# Patient Record
Sex: Male | Born: 1950 | Race: Black or African American | Hispanic: No | Marital: Married | Smoking: Never smoker
Health system: Southern US, Community
[De-identification: ages and names within clinical notes are randomized; demographics above are authoritative.]

## PROBLEM LIST (undated history)

## (undated) DIAGNOSIS — Z87442 Personal history of urinary calculi: Secondary | ICD-10-CM

## (undated) DIAGNOSIS — R42 Dizziness and giddiness: Secondary | ICD-10-CM

## (undated) DIAGNOSIS — I10 Essential (primary) hypertension: Secondary | ICD-10-CM

## (undated) DIAGNOSIS — E785 Hyperlipidemia, unspecified: Secondary | ICD-10-CM

## (undated) DIAGNOSIS — C61 Malignant neoplasm of prostate: Secondary | ICD-10-CM

## (undated) DIAGNOSIS — N200 Calculus of kidney: Secondary | ICD-10-CM

## (undated) DIAGNOSIS — K08109 Complete loss of teeth, unspecified cause, unspecified class: Secondary | ICD-10-CM

## (undated) DIAGNOSIS — M1990 Unspecified osteoarthritis, unspecified site: Secondary | ICD-10-CM

## (undated) HISTORY — DX: Essential (primary) hypertension: I10

## (undated) HISTORY — DX: Hyperlipidemia, unspecified: E78.5

## (undated) HISTORY — PX: MULTIPLE TOOTH EXTRACTIONS: SHX2053

## (undated) HISTORY — PX: CARDIAC CATHETERIZATION: SHX172

## (undated) HISTORY — DX: Calculus of kidney: N20.0

---

## 1996-01-30 HISTORY — PX: CERVICAL FUSION: SHX112

## 1999-07-04 ENCOUNTER — Emergency Department (HOSPITAL_COMMUNITY): Admission: EM | Admit: 1999-07-04 | Discharge: 1999-07-04 | Payer: Self-pay | Admitting: Emergency Medicine

## 2005-03-31 ENCOUNTER — Inpatient Hospital Stay (HOSPITAL_COMMUNITY): Admission: EM | Admit: 2005-03-31 | Discharge: 2005-04-01 | Payer: Self-pay | Admitting: Emergency Medicine

## 2005-04-03 ENCOUNTER — Emergency Department (HOSPITAL_COMMUNITY): Admission: EM | Admit: 2005-04-03 | Discharge: 2005-04-03 | Payer: Self-pay | Admitting: Emergency Medicine

## 2006-01-29 HISTORY — PX: COLONOSCOPY: SHX174

## 2006-04-22 ENCOUNTER — Ambulatory Visit: Payer: Self-pay | Admitting: Internal Medicine

## 2006-05-10 ENCOUNTER — Ambulatory Visit: Payer: Self-pay | Admitting: Internal Medicine

## 2009-02-04 ENCOUNTER — Encounter: Payer: Self-pay | Admitting: Cardiovascular Disease

## 2009-02-15 ENCOUNTER — Encounter: Payer: Self-pay | Admitting: Cardiovascular Disease

## 2009-02-24 DIAGNOSIS — E782 Mixed hyperlipidemia: Secondary | ICD-10-CM

## 2009-02-28 ENCOUNTER — Ambulatory Visit: Payer: Self-pay | Admitting: Cardiovascular Disease

## 2009-02-28 DIAGNOSIS — R9431 Abnormal electrocardiogram [ECG] [EKG]: Secondary | ICD-10-CM

## 2009-04-08 ENCOUNTER — Encounter (INDEPENDENT_AMBULATORY_CARE_PROVIDER_SITE_OTHER): Payer: Self-pay | Admitting: *Deleted

## 2010-02-28 NOTE — Letter (Signed)
Summary: Generic Letter  [facility]     [place], [place]    Phone:   Fax:     04/08/2009  BRICEN VICTORY [address] [place], [place]  [address]  Dear Mr. Weng,  During your last office vist.  Dr. Eden Emms ordered a Echocardiogram and Stress Echocardiogram.  We have been unable to reach you.  Please give our office a call at your earliest convenience to schedule the above apptointments.         Sincerely,  Jasmine December Ferguson/Dr. Charlton Haws                               Omar Person

## 2010-02-28 NOTE — Assessment & Plan Note (Signed)
Summary: ec3/abnormal ekg/r/o cad/jss  Medications Added SIMVASTATIN 20 MG TABS (SIMVASTATIN) Take one tablet by mouth daily at bedtime ASPIRIN 81 MG TBEC (ASPIRIN) Take one tablet by mouth daily VITAMIN D 2000 UNIT TABS (CHOLECALCIFEROL) 1 tab by mouth once daily      Allergies Added: NKDA  History of Present Illness: Gregory Marshall is seen today at the request of Dr Perrin Maltese for abnormal ECG.  He has CRF' of positivie family history and elevated lipids.  He is an avid cyclist and does not get SSCP, dyspnea, palpitaotns or syncope.  There is no family history of HOCM or congenital heart disease.  He is a non-smoker and works full time at [employer] including lifting heavy boxes.  He has not had a previous echo or ETT.  I reviewed and labs and ECG from urgent care.  ECG shows early repol, vs LVH vs normal variant in lateral leads and some T wave inversion in 3,F.    Current Problems (verified): 1)  Abnormal Electrocardiogram  (ICD-794.31) 2)  Hyperlipidemia  (ICD-272.4)  Current Medications (verified): 1)  Simvastatin 20 Mg Tabs (Simvastatin) .... Take One Tablet By Mouth Daily At Bedtime 2)  Aspirin 81 Mg Tbec (Aspirin) .... Take One Tablet By Mouth Daily 3)  Vitamin D 2000 Unit Tabs (Cholecalciferol) .Marland Kitchen.. 1 Tab By Mouth Once Daily  Allergies (verified): No Known Drug Allergies  Past History:  Family History: Last updated: 02/24/2009  Brother had bypass grafting in his late 73s. No other  family history of coronary disease.  Social History: Last updated: 02/28/2009  He is married. Works for [employer] Non-smoker One drink/month  Past Medical History: Current Problems:  HYPERLIPIDEMIA (ICD-272.4) Lumbar disc pain Abnormal ECG  Social History:  He is married. Works for [employer] Non-smoker One drink/month  Review of Systems       Denies fever, malais, weight loss, blurry vision, decreased visual acuity, cough, sputum, SOB, hemoptysis, pleuritic pain,  palpitaitons, heartburn, abdominal pain, melena, lower extremity edema, claudication, or rash. All other systems reviewed and negative  Vital Signs:  Patient profile:   60 year old male Height:      66 inches Weight:      192 pounds BMI:     31.10 Pulse rate:   66 / minute BP sitting:   118 / 72  (left arm)  Vitals Entered By: Kem Parkinson (February 28, 2009 4:33 PM)  Physical Exam  General:  .Affect appropriate Healthy:  appears stated age HEENT: normal Neck supple with no adenopathy JVP normal no bruits no thyromegaly Lungs clear with no wheezing and good diaphragmatic motion Heart:  S1/S2 no murmur,rub, gallop or click PMI normal Abdomen: benighn, BS positve, no tenderness, no AAA no bruit.  No HSM or HJR Distal pulses intact with no bruits No edema Neuro non-focal Skin warm and dry    Impression & Recommendations:  Problem # 1:  ABNORMAL ELECTROCARDIOGRAM (ICD-794.31) Check resting and stress echo to R/O LVH, HOCM or CAD His updated medication list for this problem includes:    Aspirin 81 Mg Tbec (Aspirin) .Marland Kitchen... Take one tablet by mouth daily  Orders: Echocardiogram (Echo) Stress Echo (Stress Echo)  Problem # 2:  HYPERLIPIDEMIA (ICD-272.4) F.U Dr Perrin Maltese for surveilance of LFT's. If stress echo normal target LDL less than 130 His updated medication list for this problem includes:    Simvastatin 20 Mg Tabs (Simvastatin) .Marland Kitchen... Take one tablet by mouth daily at bedtime  Patient Instructions: 1)  Your physician recommends  that you schedule a follow-up appointment in: AS NEEDED PENDING TEST RESULTS 2)  Your physician has requested that you have a stress echocardiogram. For further information please visit [email or web address]  Please follow instruction sheet as given. 3)  Your physician has requested that you have an echocardiogram.  Echocardiography is a painless test that uses sound waves to create images of your heart. It provides your doctor with information  about the size and shape of your heart and how well your heart's chambers and valves are working.  This procedure takes approximately one hour. There are no restrictions for this procedure.   EKG Report  Procedure date:  02/05/2009  Findings:      NSR 70 Early repol with biphasic T's in inf and lateral leads

## 2010-06-16 NOTE — Cardiovascular Report (Signed)
NAME:  Gregory Marshall, Gregory Marshall NO.:  [id number]   MEDICAL RECORD NO.:  [id number]          PATIENT TYPE:  INP   LOCATION:  2015                         FACILITY:  [facility]   PHYSICIAN:  Madaline Savage, M.D.DATE OF BIRTH:  [birth year]   DATE OF PROCEDURE:  03/31/2005  DATE OF DISCHARGE:  04/01/2005                              CARDIAC CATHETERIZATION   PROCEDURE PERFORMED:  Emergency cardiac catheterization for CODE STEMI  called by the emergency room physician [facility].   CLINICAL PICTURE:  The patient is a 60 year old gentleman, who has no prior  history of coronary disease, who was riding a bike today and after getting  off the bike and some 5-10 minutes later developed severe nausea and  vomiting and felt very weak. The patient did not have chest pain. Repeat the  patient did not have chest pain. He ultimately came to the [facility]  Emergency Room where the emergency department physician got an  electrocardiogram and repeated it. It showed ST-segment elevation in the  septal leads with T-wave inversions in the anterolateral leads which could  have been interpreted several different ways. One interpretation would be  that it had early repolarization and nonspecific ST changes elsewhere.  Another possibility would be that he was having acute anteroseptal MI. The  patient never had chest pain. Cardiac enzymes first time around were  negative. I brought the patient to the cardiac cath lab based on the call by  the emergency department physician and also because the patient's younger  brother and had coronary artery bypass grafting in his late 72s.   RESULTS:   PRESSURES:  The left ventricular pressure was 130/10, end-diastolic pressure  29. Central aortic pressure 130/75, mean of 100. No aortic valve gradient by  pullback technique.   ANGIOGRAPHIC RESULTS:  The patient's coronary arteries were angiographically  patent. They consisted of a normal left main  coronary artery. The left  anterior descending coronary artery coursed the cardiac apex and gave rise  to one small diagonal branch. There was some myocardial bridging in the mid-  LAD but no obstructive lesions. The circumflex coronary artery was  nondominant but still a fairly large vessel with no disease. There was a  very large obtuse marginal branch arising from the circumflex. There was an  intermediate ramus branch arising from left main that bifurcated shortly  after arising from the left main and both limbs of this intermediate ramus  were normal.   The right coronary artery was dominant and no lesions were seen. LV  angiography showed normal contractility of all wall segments with no  evidence of thrombus or mitral regurgitation. Abdominal aortography was  performed to ensure there was no dissection there. The abdominal aorta was  normal. The renal arteries were normal.   FINAL DIAGNOSIS:  1.  Abnormal electrocardiogram with CODE STEMI called by emergency      department physician.  2.  Angiographic patent coronary arteries.  3.  Normal left ventricular systolic function.  4.  Normal abdominal aorta and renal arteries.   PLAN:  The patient will have continued enzyme  determinations during the  night to ensure that he did not have a myocardial infarction related to  coronary spasm which I do doubt. He will be discharged tomorrow morning  assuming his enzymes are negative and he has an uncomplicated clinical  course this evening and tomorrow morning.           ______________________________  Madaline Savage, M.D.     WHG/MEDQ  D:  03/31/2005  T:  04/02/2005  Job:  [id number]   cc:   [facility]   Medical records

## 2010-06-16 NOTE — Discharge Summary (Signed)
NAME:  ABDOULAYE, DRUM NO.:  [id number]   MEDICAL RECORD NO.:  [id number]          PATIENT TYPE:  INP   LOCATION:  2015                         FACILITY:  [facility]   PHYSICIAN:  Madaline Savage, M.D.DATE OF BIRTH:  12/08/1950   DATE OF ADMISSION:  03/31/2005  DATE OF DISCHARGE:  04/01/2005                                 DISCHARGE SUMMARY   DISCHARGE DIAGNOSES:  1.  Nausea and vomiting, no chest pain.  2.  Abnormal electrocardiogram with appearing to be ST elevation in anterior      leads and flipped T waves in his lateral leads.  3.  Patent coronary arteries, no obvious spasm.  4.  Negative myocardial infarction.  5.  Hyperlipidemia.   DISCHARGE CONDITION:  Improved.   PROCEDURES:  Emergent cardiac catheterization on March 31, 2005 by Dr.  Madaline Savage.   DISCHARGE MEDICATIONS:  1.  Vytorin 10/40 one daily.  2.  The patient had been enrolled in the Horizons Study, including the first      part, receiving Angiomax, but no intervention, so no specific discharge      medicines.   DISCHARGE INSTRUCTIONS:  1.  May return to work on March 31, 2005. No heavy lifting.  2.  Low fat diet.  3.  Wash right groin cath site with soap and water. Call if any bleeding,      swelling or drainage.  4.  Follow up with Dr. Elsie Lincoln in our office. Should call you for the date      and time.  5.  Follow up with [facility], [address], for a complete      physical and to assist with ruling out gallbladder disease.  6.  If you have any further questions on the research, call [facility],      [phone].   HISTORY OF PRESENT ILLNESS:  A 60 year old African American male presented  to the emergency room on March 31, 2005, with nausea and vomiting, no chest  pain. In the ER, they did an EKG and he had ST elevations. A Code STEMI was  called. The patient was taken emergently to the cath lab after being placed  on IV heparin and nitroglycerin. He was also given IV  Lopressor. He was  vomiting and got Zofran. He also received p.o. aspirin. He went to the  cardiac cath lab emergently for further evaluation. He was enrolled in the  Horizons Study at that time.   PAST MEDICAL HISTORY:  No significant history. No hypertension.   OUTPATIENT MEDICATIONS:  None.   FAMILY HISTORY:  Brother had bypass grafting in his late 80s. No other  family history of coronary disease.   SOCIAL HISTORY:  He is married.   REVIEW OF SYSTEMS:  No chest pain, only nausea and vomiting and that was  sudden.   ALLERGIES:  No known allergies.   PHYSICAL EXAMINATION AT DISCHARGE:  VITAL SIGNS: Blood pressure 107/64,  pulse 68, respiratory rate 20, temp 97.4, oxygen saturation on room air 98%.  HEART: Regular rate and rhythm, without murmur, gallop, rub or click.  LUNGS: Clear.  ABDOMEN: Soft, nontender.  EXTREMITIES: Right groin cath site stable. No hematoma.   CARDIAC CATHETERIZATION:  Patent coronary arteries. No MR. EF was 60%. Also,  renal arteries were patent.   LABORATORY DATA:  Admitting labs - hemoglobin 16, hematocrit 45, creatinine  1.3, glucose 151. On the morning of discharge, hemoglobin 14.8, hematocrit  42.8, platelets 300, WBC 10.7, sodium 135, potassium 4.3, BUN 11, creatinine  1.1, glucose 119. Troponin I was 0.03 and 0.02 and 0.02. CKs 306 and 235 and  234. MBs negative at 5.2 and 4.0 and 3.7. LDL 186. HDL 47. Total cholesterol  242. Glycohemoglobin 5.6. BNP less than 30. TSH 0.849. C-reactive protein  0.1. Chest x-ray - no active disease. EKGs - sinus rhythm with sinus  arrhythmia, first degree AV block, PR was 224 milliseconds, possible left  atrial enlargement, ST elevations in V1, V2 and V3, T-wave inversion in V4,  V5 and V6. Followup EKG - similar presentation. EKG on the morning of  discharge - same without any further changes.   HOSPITAL COURSE:  Mr. Rebert was admitted emergently by Dr. Elsie Lincoln secondary  to ST elevations on EKG, after  presenting with nausea and vomiting, but no  chest pain. He was taken emergently to the cath lab after receiving IV  heparin, nitroglycerin, IV Lopressor, Zofran and aspirin. In the cath lab,  he was found to have patent coronary arteries and no obvious spasm was  noted. He received groin closure and was transferred to the telemetry unit  and monitored overnight. He had no further issues during the night, and by  the next morning, cardiac enzymes were negative. His LDL was elevated at  186. He had been placed on Vytorin 10/40.   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 We  will allow him to return to work on April 04, 2005. He will also need to  follow up with [facility] on [address] for further evaluation of  his nausea and vomiting, especially if it reoccurs, to rule out gallbladder  disease.   Dr. Elsie Lincoln saw him and discharged him on April 01, 2005.      Darcella Gasman. Annie Paras, N.P.    ______________________________  Madaline Savage, M.D.   LRI/MEDQ  D:  04/01/2005  T:  04/02/2005  Job:  [id number]   cc:   Guerry Bruin, M.D.  [facility]  [address]  [place], [place] [address]

## 2011-02-14 ENCOUNTER — Ambulatory Visit (INDEPENDENT_AMBULATORY_CARE_PROVIDER_SITE_OTHER): Payer: 59

## 2011-02-14 DIAGNOSIS — J029 Acute pharyngitis, unspecified: Secondary | ICD-10-CM

## 2011-02-14 DIAGNOSIS — J111 Influenza due to unidentified influenza virus with other respiratory manifestations: Secondary | ICD-10-CM

## 2011-04-27 ENCOUNTER — Ambulatory Visit (INDEPENDENT_AMBULATORY_CARE_PROVIDER_SITE_OTHER): Payer: 59 | Admitting: Family Medicine

## 2011-04-27 ENCOUNTER — Ambulatory Visit: Payer: 59

## 2011-04-27 VITALS — BP 127/80 | HR 69 | Temp 97.8°F | Resp 16 | Ht 64.5 in | Wt 174.8 lb

## 2011-04-27 DIAGNOSIS — N529 Male erectile dysfunction, unspecified: Secondary | ICD-10-CM

## 2011-04-27 DIAGNOSIS — I10 Essential (primary) hypertension: Secondary | ICD-10-CM

## 2011-04-27 DIAGNOSIS — E7800 Pure hypercholesterolemia, unspecified: Secondary | ICD-10-CM

## 2011-04-27 MED ORDER — SILDENAFIL CITRATE 100 MG PO TABS: 50.0000 mg | ORAL_TABLET | Freq: Every day | ORAL | Status: DC | PRN

## 2011-04-27 MED ORDER — SIMVASTATIN 40 MG PO TABS: 40.0000 mg | ORAL_TABLET | Freq: Every evening | ORAL | Status: DC

## 2011-04-27 NOTE — Progress Notes (Signed)
  Patient Name: Gregory Marshall Date of Birth: 1950/09/28 Medical Record Number: [id number] Gender: male Date of Encounter: 04/27/2011  History of Present Illness:  Gregory Marshall is a 61 y.o. very pleasant male patient who presents with the following:  Here today for refills of his cholesterol and ED meds.   He is not fasting today and plans to RTC for a CPE soon.  He notes no problems with his medication, no CP or any other symptoms  Patient Active Problem List  Diagnoses  . HYPERLIPIDEMIA  . ABNORMAL ELECTROCARDIOGRAM   No past medical history on file. No past surgical history on file. History  Substance Use Topics  . Smoking status: Never Smoker   . Smokeless tobacco: Not on file  . Alcohol Use: Yes     beer - every now and then   No family history on file. No Known Allergies  Medication list has been reviewed and updated.  Review of Systems: As per HPI- otherwise negative.   Physical Examination: Filed Vitals:   04/27/11 1653  BP: 127/80  Pulse: 69  Temp: 97.8 F (36.6 C)  TempSrc: Oral  Resp: 16  Height: 5' 4.5" (1.638 m)  Weight: 174 lb 12.8 oz (79.289 kg)    Body mass index is 29.54 kg/(m^2).  GEN: WDWN, NAD, Non-toxic, A & O x 3 HEENT: Atraumatic, Normocephalic. Neck supple. No masses, No LAD. Ears and Nose: No external deformity. CV: RRR, No M/G/R. No JVD. No thrill. No extra heart sounds. PULM: CTA B, no wheezes, crackles, rhonchi. No retractions. No resp. distress. No accessory muscle use.Marland Kitchen EXTR: No c/c/e NEURO Normal gait.  PSYCH: Normally interactive. Conversant. Not depressed or anxious appearing.  Calm demeanor.    Assessment and Plan: 1. High cholesterol  simvastatin (ZOCOR) 40 MG tablet  2. ED (erectile dysfunction)  sildenafil (VIAGRA) 100 MG tablet   Refilled meds- encouraged to RTC for recheck if not better as above

## 2011-08-29 ENCOUNTER — Ambulatory Visit (INDEPENDENT_AMBULATORY_CARE_PROVIDER_SITE_OTHER): Payer: 59 | Admitting: Emergency Medicine

## 2011-08-29 VITALS — BP 136/78 | HR 65 | Temp 97.9°F | Resp 17 | Ht 65.0 in | Wt 172.0 lb

## 2011-08-29 DIAGNOSIS — M7660 Achilles tendinitis, unspecified leg: Secondary | ICD-10-CM

## 2011-08-29 DIAGNOSIS — M7661 Achilles tendinitis, right leg: Secondary | ICD-10-CM

## 2011-08-29 MED ORDER — NAPROXEN SODIUM 550 MG PO TABS: 550.0000 mg | ORAL_TABLET | Freq: Two times a day (BID) | ORAL | Status: DC

## 2011-08-29 NOTE — Progress Notes (Signed)
  Subjective:    Patient ID: Gregory Marshall, male    DOB: 11/06/1950, 61 y.o.   MRN: [id number]  HPI  2 year ago history of sprained ankle treated with boot.  Improved with negative interval.  Has pain in right heel started 2 weeks ago causing him to limp.  No history of reinjury  No edema or ecchymosis.  Review of Systems As per HPI, otherwise negative.    Objective:   Physical Exam   GEN: WDWN, NAD, Non-toxic, Alert & Oriented x 3 HEENT: Atraumatic, Normocephalic.  Ears and Nose: No external deformity. EXTR: No clubbing/cyanosis/edema  Tender over insertion of achilles tendon.  No erythema.   Full PROM and AROM.   NEURO: Normal gait.  PSYCH: Normally interactive. Conversant. Not depressed or anxious appearing.  Calm demeanor.        Assessment & Plan:  Achilles tendonitis Boot Anaprox Follow up in two weeks

## 2012-06-09 ENCOUNTER — Ambulatory Visit (INDEPENDENT_AMBULATORY_CARE_PROVIDER_SITE_OTHER): Payer: 59 | Admitting: Emergency Medicine

## 2012-06-09 ENCOUNTER — Ambulatory Visit: Payer: 59

## 2012-06-09 VITALS — BP 128/72 | HR 68 | Temp 97.8°F | Resp 16 | Ht 66.0 in | Wt 179.0 lb

## 2012-06-09 DIAGNOSIS — M549 Dorsalgia, unspecified: Secondary | ICD-10-CM

## 2012-06-09 DIAGNOSIS — M5137 Other intervertebral disc degeneration, lumbosacral region: Secondary | ICD-10-CM

## 2012-06-09 DIAGNOSIS — R200 Anesthesia of skin: Secondary | ICD-10-CM

## 2012-06-09 DIAGNOSIS — R209 Unspecified disturbances of skin sensation: Secondary | ICD-10-CM

## 2012-06-09 DIAGNOSIS — M5136 Other intervertebral disc degeneration, lumbar region: Secondary | ICD-10-CM

## 2012-06-09 MED ORDER — PREDNISONE 10 MG PO TABS: ORAL_TABLET | ORAL | Status: DC

## 2012-06-09 MED ORDER — HYDROCODONE-ACETAMINOPHEN 5-325 MG PO TABS: 1.0000 | ORAL_TABLET | Freq: Four times a day (QID) | ORAL | Status: DC | PRN

## 2012-06-09 NOTE — Progress Notes (Signed)
  Subjective:    Patient ID: Gregory Marshall, male    DOB: 1950/04/22, 62 y.o.   MRN: [id number]  HPI patient here with onset Friday evening of severe pain in his lower back. It started with pain in the we'll lumbar spine area L5-S1. He now has a burning sensation over the lateral left hip. This is not associated with any weakness or numbness in his left foot or ankle. He does have some difficulty walking because of pain in his back. Patient works at [employer] ex. He does heavy lifting all the time.    Review of Systems     Objective:   Physical Exam is tenderness at L5-S1 on the left. Straight leg raising is positive on the left at 90. Deep tendon reflexes are 2+ and symmetrical. Motor strength is 5 out of 5 all muscle groups.  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 There is degenerative disc disease at L4-5 and L5-S1. There is a large calcified confluent area over the pubic symphysis.        Assessment & Plan:  Shot with lumbar spine films. He does have signs and symptoms consistent with an LS strain with radiculopathy . Because of this heavy lifting this raises the issue of a disc disease. The patient has had previous surgery on his neck by Dr. Ezzard Standing. We'll place on a tapered dose of prednisone, hydrocodone for pain, a work note for one week. recheck in about a week.

## 2012-06-10 ENCOUNTER — Telehealth: Payer: Self-pay

## 2012-06-10 NOTE — Telephone Encounter (Signed)
PT WAS SEEN BY DR Cleta Alberts AND WANTED TO SPEAK WITH SOMEONE ABOUT A BACK BRACE PLEASE CALL [phone] OR [phone]

## 2012-06-11 ENCOUNTER — Ambulatory Visit (INDEPENDENT_AMBULATORY_CARE_PROVIDER_SITE_OTHER): Payer: 59 | Admitting: Emergency Medicine

## 2012-06-11 VITALS — BP 150/78 | HR 68 | Temp 97.7°F | Resp 16 | Ht 66.0 in | Wt 182.0 lb

## 2012-06-11 DIAGNOSIS — M5432 Sciatica, left side: Secondary | ICD-10-CM

## 2012-06-11 DIAGNOSIS — M5430 Sciatica, unspecified side: Secondary | ICD-10-CM | POA: Insufficient documentation

## 2012-06-11 MED ORDER — OXYCODONE-ACETAMINOPHEN 5-325 MG PO TABS: 1.0000 | ORAL_TABLET | ORAL | Status: DC | PRN

## 2012-06-11 NOTE — Progress Notes (Signed)
Reviewed and agree.

## 2012-06-11 NOTE — Progress Notes (Signed)
[facility] [address], [place] [address] [id number]  Date:  06/11/2012   Name:  Gregory Marshall   DOB:  [birth year]   MRN:  [id number]  PCP:  No PCP Per Patient    Chief Complaint: Follow-up   History of Present Illness:  Gregory Marshall is a 62 y.o. very pleasant male patient who presents with the following:  Seen last week for pain in back and left sciatic neuritis.  Placed on vicodin and prednisone and is awaiting a neurosurgery referral.  Says no improvement in pain or gait.  Still has pain in leg to below knee.  Continues to have numbness in left leg.  No weakness.  No history of recent injury or overuse.  Denies other complaint or health concern today.   Patient Active Problem List   Diagnosis Date Noted  . Sciatica 06/11/2012  . ABNORMAL ELECTROCARDIOGRAM 02/28/2009  . HYPERLIPIDEMIA 02/24/2009    Past Medical History  Diagnosis Date  . Hyperlipidemia     No past surgical history on file.  History  Substance Use Topics  . Smoking status: Never Smoker   . Smokeless tobacco: Not on file  . Alcohol Use: Yes     Comment: beer - every now and then    No family history on file.  No Known Allergies  Medication list has been reviewed and updated.  Current Outpatient Prescriptions on File Prior to Visit  Medication Sig Dispense Refill  . HYDROcodone-acetaminophen (NORCO) 5-325 MG per tablet Take 1 tablet by mouth every 6 (six) hours as needed for pain.  30 tablet  0  . naproxen sodium (ANAPROX DS) 550 MG tablet Take 1 tablet (550 mg total) by mouth 2 (two) times daily with a meal.  50 tablet  0  . predniSONE (DELTASONE) 10 MG tablet Take 4 day for 3 days 3 a day for 3 days 2 a day for 3 days one a day for 3 days  30 tablet  0  . sildenafil (VIAGRA) 100 MG tablet Take 0.5 tablets (50 mg total) by mouth daily as needed.  5 tablet  10  . simvastatin (ZOCOR) 40 MG tablet Take 1 tablet (40 mg total) by mouth every evening.  90 tablet  3   No  current facility-administered medications on file prior to visit.    Review of Systems:  As per HPI, otherwise negative.    Physical Examination: Filed Vitals:   06/11/12 0818  BP: 150/78  Pulse: 68  Temp: 97.7 F (36.5 C)  Resp: 16   Filed Vitals:   06/11/12 0818  Height: 5\' 6"  (1.676 m)  Weight: 182 lb (82.555 kg)   Body mass index is 29.39 kg/(m^2). Ideal Body Weight: Weight in (lb) to have BMI = 25: 154.6   GEN: WDWN, NAD, Non-toxic, Alert & Oriented x 3 HEENT: Atraumatic, Normocephalic.  Ears and Nose: No external deformity. EXTR: No clubbing/cyanosis/edema NEURO: Antalgic gait. SLR impaired PSYCH: Normally interactive. Conversant. Not depressed or anxious appearing.  Calm demeanor.  BACK:  Not tender.   Assessment and Plan: Percocet MRI Follow up as needed Off work until cleared   Signed,  Phillips Odor, MD

## 2012-06-11 NOTE — Telephone Encounter (Signed)
Called him, back brace could weaken his abdominal muscles, may not be a good idea. If he needs it for work, can do Rx but he should limit use of this, if he gets one.

## 2012-06-11 NOTE — Patient Instructions (Addendum)
Sciatica Sciatica is pain, weakness, numbness, or tingling along the path of the sciatic nerve. The nerve starts in the lower back and runs down the back of each leg. The nerve controls the muscles in the lower leg and in the back of the knee, while also providing sensation to the back of the thigh, lower leg, and the sole of your foot. Sciatica is a symptom of another medical condition. For instance, nerve damage or certain conditions, such as a herniated disk or bone spur on the spine, pinch or put pressure on the sciatic nerve. This causes the pain, weakness, or other sensations normally associated with sciatica. Generally, sciatica only affects one side of the body. CAUSES   Herniated or slipped disc.  Degenerative disk disease.  A pain disorder involving the narrow muscle in the buttocks (piriformis syndrome).  Pelvic injury or fracture.  Pregnancy.  Tumor (rare). SYMPTOMS  Symptoms can vary from mild to very severe. The symptoms usually travel from the low back to the buttocks and down the back of the leg. Symptoms can include:  Mild tingling or dull aches in the lower back, leg, or hip.  Numbness in the back of the calf or sole of the foot.  Burning sensations in the lower back, leg, or hip.  Sharp pains in the lower back, leg, or hip.  Leg weakness.  Severe back pain inhibiting movement. These symptoms may get worse with coughing, sneezing, laughing, or prolonged sitting or standing. Also, being overweight may worsen symptoms. DIAGNOSIS  Your caregiver will perform a physical exam to look for common symptoms of sciatica. He or she may ask you to do certain movements or activities that would trigger sciatic nerve pain. Other tests may be performed to find the cause of the sciatica. These may include:  Blood tests.  X-rays.  Imaging tests, such as an MRI or CT scan. TREATMENT  Treatment is directed at the cause of the sciatic pain. Sometimes, treatment is not necessary  and the pain and discomfort goes away on its own. If treatment is needed, your caregiver may suggest:  Over-the-counter medicines to relieve pain.  Prescription medicines, such as anti-inflammatory medicine, muscle relaxants, or narcotics.  Applying heat or ice to the painful area.  Steroid injections to lessen pain, irritation, and inflammation around the nerve.  Reducing activity during periods of pain.  Exercising and stretching to strengthen your abdomen and improve flexibility of your spine. Your caregiver may suggest losing weight if the extra weight makes the back pain worse.  Physical therapy.  Surgery to eliminate what is pressing or pinching the nerve, such as a bone spur or part of a herniated disk. HOME CARE INSTRUCTIONS   Only take over-the-counter or prescription medicines for pain or discomfort as directed by your caregiver.  Apply ice to the affected area for 20 minutes, 3 4 times a day for the first 48 72 hours. Then try heat in the same way.  Exercise, stretch, or perform your usual activities if these do not aggravate your pain.  Attend physical therapy sessions as directed by your caregiver.  Keep all follow-up appointments as directed by your caregiver.  Do not wear high heels or shoes that do not provide proper support.  Check your mattress to see if it is too soft. A firm mattress may lessen your pain and discomfort. SEEK IMMEDIATE MEDICAL CARE IF:   You lose control of your bowel or bladder (incontinence).  You have increasing weakness in the lower back,   pelvis, buttocks, or legs.  You have redness or swelling of your back.  You have a burning sensation when you urinate.  You have pain that gets worse when you lie down or awakens you at night.  Your pain is worse than you have experienced in the past.  Your pain is lasting longer than 4 weeks.  You are suddenly losing weight without reason. MAKE SURE YOU:  Understand these  instructions.  Will watch your condition.  Will get help right away if you are not doing well or get worse. Document Released: 01/09/2001 Document Revised: 07/17/2011 Document Reviewed: 05/27/2011 [employer] Patient Information 2013 [employer].  

## 2012-06-12 ENCOUNTER — Telehealth: Payer: Self-pay

## 2012-06-12 ENCOUNTER — Ambulatory Visit (INDEPENDENT_AMBULATORY_CARE_PROVIDER_SITE_OTHER): Payer: 59 | Admitting: Family Medicine

## 2012-06-12 VITALS — BP 164/84 | HR 68 | Temp 98.3°F | Resp 18 | Ht 64.5 in | Wt 179.2 lb

## 2012-06-12 DIAGNOSIS — M5432 Sciatica, left side: Secondary | ICD-10-CM

## 2012-06-12 DIAGNOSIS — M545 Low back pain, unspecified: Secondary | ICD-10-CM

## 2012-06-12 DIAGNOSIS — M5136 Other intervertebral disc degeneration, lumbar region: Secondary | ICD-10-CM

## 2012-06-12 DIAGNOSIS — M5137 Other intervertebral disc degeneration, lumbosacral region: Secondary | ICD-10-CM

## 2012-06-12 DIAGNOSIS — M5430 Sciatica, unspecified side: Secondary | ICD-10-CM

## 2012-06-12 DIAGNOSIS — M51369 Other intervertebral disc degeneration, lumbar region without mention of lumbar back pain or lower extremity pain: Secondary | ICD-10-CM

## 2012-06-12 MED ORDER — DIAZEPAM 5 MG PO TABS: 5.0000 mg | ORAL_TABLET | Freq: Two times a day (BID) | ORAL | Status: DC | PRN

## 2012-06-12 MED ORDER — GABAPENTIN 300 MG PO CAPS: 300.0000 mg | ORAL_CAPSULE | Freq: Two times a day (BID) | ORAL | Status: DC

## 2012-06-12 MED ORDER — KETOROLAC TROMETHAMINE 60 MG/2ML IM SOLN
60.0000 mg | Freq: Once | INTRAMUSCULAR | Status: AC
  Administered 2012-06-12: 60 mg via INTRAMUSCULAR

## 2012-06-12 MED ORDER — HYDROMORPHONE HCL 2 MG PO TABS: 2.0000 mg | ORAL_TABLET | ORAL | Status: DC | PRN

## 2012-06-12 MED ORDER — LIDOCAINE 5 % EX PTCH: 1.0000 | MEDICATED_PATCH | CUTANEOUS | Status: DC

## 2012-06-12 NOTE — Telephone Encounter (Signed)
A.  To get another pain medicine, he MUST come in B.  Not much stronger than percocet

## 2012-06-12 NOTE — Telephone Encounter (Signed)
He has percocet, waiting for MRI / then neurosurgeon,please advise. States no relief.

## 2012-06-12 NOTE — Telephone Encounter (Signed)
Left message for him to call me back.  

## 2012-06-12 NOTE — Progress Notes (Signed)
Subjective:    Patient ID: Gregory Marshall, male    DOB: [birth year], 62 y.o.   MRN: [id number] Chief Complaint  Patient presents with  . Follow-up    to recheck lower back. Severe pain, need to change meds. for pain   HPI   Gregory Marshall is a 62 yo male who has been twice previously in the past 3d Saw Dr. Newell Coral previously for his neck surgery a long time ago - still waiting on appointment.  Was not in this bad of pain before, couldn't even sleep last night, can barely walk.  They just called him to scheduled MRI but he was in our office so prob not till next week.  Doesn't think steroids helped at all.  The percocet dulls his pain for a little but but as soon as he starts moving or tries to get some sleep, he is in just as much pain.    Pain shoooting down left leg, no weakness or numbness, some tingling.  No changes in bowels/bladder.  No f/c.  Pain was so severe it was cuasing him sweats.  Past Medical History  Diagnosis Date  . Hyperlipidemia   . Vertigo     hx of, x 1  . Arthritis    Current Outpatient Prescriptions on File Prior to Visit  Medication Sig Dispense Refill  . oxyCODONE-acetaminophen (ROXICET) 5-325 MG per tablet Take 1 tablet by mouth every 4 (four) hours as needed for pain.  40 tablet  0  . simvastatin (ZOCOR) 40 MG tablet Take 1 tablet (40 mg total) by mouth every evening.  90 tablet  3  . naproxen sodium (ANAPROX DS) 550 MG tablet Take 1 tablet (550 mg total) by mouth 2 (two) times daily with a meal.  50 tablet  0   No current facility-administered medications on file prior to visit.   No Known Allergies   Review of Systems  Constitutional: Positive for diaphoresis. Negative for fever and chills.  Gastrointestinal: Negative for abdominal pain, diarrhea and constipation.  Genitourinary: Negative for urgency, frequency, decreased urine volume and difficulty urinating.  Musculoskeletal: Positive for myalgias, back pain, arthralgias and gait problem. Negative for  joint swelling.  Skin: Negative for color change and wound.  Neurological: Negative for dizziness, weakness, light-headedness and numbness.      BP 164/84  Pulse 68  Temp(Src) 98.3 F (36.8 C) (Oral)  Resp 18  Ht 5' 4.5" (1.638 m)  Wt 179 lb 3.2 oz (81.285 kg)  BMI 30.3 kg/m2  SpO2 99% Objective:   Physical Exam  Constitutional: He is oriented to person, place, and time. He appears well-developed and well-nourished. He appears distressed.  Visibly uncomfortable, unable to sit in chair, rearranging frequently.  HENT:  Head: Normocephalic and atraumatic.  Eyes: No scleral icterus.  Pulmonary/Chest: Effort normal.  Neurological: He is alert and oriented to person, place, and time.  Skin: Skin is warm and dry. He is not diaphoretic.  Psychiatric: He has a normal mood and affect. His behavior is normal.      Assessment & Plan:  Sciatica, left - Plan: ketorolac (TORADOL) injection 60 mg IM x 1 in office now as pt clearly VERY uncomfortable.  DDD (degenerative disc disease), lumbar - f/u w/ Dr. Newell Coral, neurosurg ASAP - call today for an appt.  Low back pain radiating to left leg - Pt having severe pain that is not responding to prev pain meds rx'ed at 2 visits earlier this wk - rec doubling up on percocet to  2 tabs instead of 1 ever 4 to 6 hours. If pain is uncontrolled on 2 tabs percocet 5, he can alternatively fill dilaudid but do NOT use simultaneously.  Start trial of gabapentin and lidoderm patch as well - warned of cessation.  Can try valium at night to help w/ spasming and sleep.  Meds ordered this encounter  Medications  . HYDROmorphone (DILAUDID) 2 MG tablet    Sig: Take 1 tablet (2 mg total) by mouth every 4 (four) hours as needed for pain.    Dispense:  30 tablet    Refill:  0  . gabapentin (NEURONTIN) 300 MG capsule    Sig: Take 1 capsule (300 mg total) by mouth 2 (two) times daily.    Dispense:  60 capsule    Refill:  0  . lidocaine (LIDODERM) 5 %    Sig: Place 1  patch onto the skin daily. Remove & Discard patch within 12 hours.    Dispense:  30 patch    Refill:  0  . diazepam (VALIUM) 5 MG tablet    Sig: Take 1 tablet (5 mg total) by mouth every 12 (twelve) hours as needed for sleep (and severe muscle spasm).    Dispense:  20 tablet    Refill:  0  . ketorolac (TORADOL) injection 60 mg    Sig:

## 2012-06-12 NOTE — Patient Instructions (Signed)
Increase your percocet to 2 tabs four times a day.  Get your MRI scheduled as soon as you can.  Start the gabapentin twice a day - this may make you a little fatigued at first but should improve over time. This helps the shooting, burning nerve pain.  Apply the topical lidocaine patch every morning to the highest area on your back with pain and it should numb that area and hopefully the nerve traveling from there as well.  Take the valium at night - this should help with sleep and muscle  relaxant. Do not combine the valium and to much of the pain medication as it could slow - OR EVEN STOP - your breathing - we do NOT want you to ACCIDENTALLY OVERDOSE!!!  If the 2 tabs of percocet at a time, still aren't working for you, then you can try the dilaudid prescription instead.

## 2012-06-12 NOTE — Telephone Encounter (Signed)
Left message for him to call back if he needs for his job. Amy

## 2012-06-12 NOTE — Telephone Encounter (Signed)
Patient states pain meds for slipped disk are not working. Would like something stronger until he can get to neurosurgeon. [phone]

## 2012-06-15 ENCOUNTER — Ambulatory Visit
Admission: RE | Admit: 2012-06-15 | Discharge: 2012-06-15 | Disposition: A | Discharge status: Home / Self Care | Payer: 59 | Source: Ambulatory Visit | Attending: Emergency Medicine | Admitting: Emergency Medicine

## 2012-06-15 DIAGNOSIS — M5432 Sciatica, left side: Secondary | ICD-10-CM

## 2012-06-16 ENCOUNTER — Telehealth: Payer: Self-pay

## 2012-06-16 NOTE — Telephone Encounter (Signed)
THIS MESSAGE IS FOR DR. SHAW  Gregory Marshall WANTS TO THANK YOU FOR EVERYTHING YOU HAVE DONE FOR HIM. BEST PHONE [phone] (CELL)  MBC

## 2012-06-17 NOTE — Telephone Encounter (Signed)
Called Lupita Leash we are waiting to hear back from this appt, we sent information on him on 06/10/12. I will call today and check on this to see if I can get appt for him.

## 2012-06-17 NOTE — Telephone Encounter (Signed)
Thanks. - I'm really glad he finally got some relief with the medications we tried.  Looks like he will definitely need to see neuro surg for the nerve that is being pinched by a bulging/herniated disc on the left side of the middle of his low back. Has he heard back from them yet and gotten an appt?

## 2012-06-18 NOTE — Telephone Encounter (Signed)
Called him they did get him and he has appt, they will see him today

## 2012-06-19 ENCOUNTER — Other Ambulatory Visit: Payer: Self-pay | Admitting: Neurosurgery

## 2012-06-19 NOTE — Pre-Procedure Instructions (Signed)
Gregory Marshall  06/19/2012   Your procedure is scheduled on: Saturday, May 24th.  Report to [facility] at 6:00AM.  Call this number if you have problems the morning of surgery: [phone]   Remember:   Do not eat food or drink liquids after midnight.    Take these medicines the morning of surgery with A SIP OF WATER: gabapentin (NEURONTIN).                    May take pain medication and diazepam (VALIUM) if needed.                 Stop taking: naproxen sodium (ANAPROX DS)                      Do not wear jewelry, make-up or nail polish.  Do not wear lotions, powders, or perfumes. You may wear deodorant.  Men may shave face and neck.  Do not bring valuables to the hospital.  Contacts, dentures or bridgework may not be worn into surgery.  Leave suitcase in the car. After surgery it may be brought to your room.  For patients admitted to the hospital, checkout time is 11:00 AM the day of discharge.   Patients discharged the day of surgery will not be allowed to drive home.  Name and phone number of your driver:-    Special Instructions:Shower with CHG wash (Bactoshield) tonight and again in the am prior to arriving to hospital.   Please read over the following fact sheets that you were given: Pain Booklet, Coughing and Deep Breathing and Surgical Site Infection Prevention

## 2012-06-20 ENCOUNTER — Encounter (HOSPITAL_COMMUNITY): Payer: Self-pay

## 2012-06-20 ENCOUNTER — Encounter (HOSPITAL_COMMUNITY)
Admission: RE | Admit: 2012-06-20 | Discharge: 2012-06-20 | Disposition: A | Discharge status: Home / Self Care | Payer: 59 | Source: Ambulatory Visit | Attending: Neurosurgery | Admitting: Neurosurgery

## 2012-06-20 ENCOUNTER — Encounter (HOSPITAL_COMMUNITY): Payer: Self-pay | Admitting: Pharmacy Technician

## 2012-06-20 HISTORY — DX: Dizziness and giddiness: R42

## 2012-06-20 HISTORY — DX: Unspecified osteoarthritis, unspecified site: M19.90

## 2012-06-20 LAB — CBC
MCHC: 35.4 g/dL (ref 30.0–36.0)
MCV: 84 fL (ref 78.0–100.0)
Platelets: 264 10*3/uL (ref 150–400)
WBC: 7.3 10*3/uL (ref 4.0–10.5)

## 2012-06-20 LAB — SURGICAL PCR SCREEN
MRSA, PCR: NEGATIVE
Staphylococcus aureus: NEGATIVE

## 2012-06-20 MED ORDER — CEFAZOLIN SODIUM-DEXTROSE 2-3 GM-% IV SOLR
2.0000 g | INTRAVENOUS | Status: AC
  Administered 2012-06-21: 2 g via INTRAVENOUS

## 2012-06-21 ENCOUNTER — Encounter (HOSPITAL_COMMUNITY)
Admission: RE | Disposition: A | Discharge status: Home / Self Care | Payer: Self-pay | Source: Ambulatory Visit | Attending: Neurosurgery

## 2012-06-21 ENCOUNTER — Ambulatory Visit (HOSPITAL_COMMUNITY): Payer: 59

## 2012-06-21 ENCOUNTER — Encounter (HOSPITAL_COMMUNITY): Payer: Self-pay | Admitting: Anesthesiology

## 2012-06-21 ENCOUNTER — Encounter (HOSPITAL_COMMUNITY): Payer: Self-pay | Admitting: *Deleted

## 2012-06-21 ENCOUNTER — Ambulatory Visit (HOSPITAL_COMMUNITY): Payer: 59 | Admitting: Anesthesiology

## 2012-06-21 ENCOUNTER — Observation Stay (HOSPITAL_COMMUNITY)
Admission: RE | Admit: 2012-06-21 | Discharge: 2012-06-22 | Disposition: A | Discharge status: Home / Self Care | Payer: 59 | Source: Ambulatory Visit | Attending: Neurosurgery | Admitting: Neurosurgery

## 2012-06-21 DIAGNOSIS — M47817 Spondylosis without myelopathy or radiculopathy, lumbosacral region: Secondary | ICD-10-CM | POA: Insufficient documentation

## 2012-06-21 DIAGNOSIS — M5126 Other intervertebral disc displacement, lumbar region: Principal | ICD-10-CM | POA: Insufficient documentation

## 2012-06-21 DIAGNOSIS — M51379 Other intervertebral disc degeneration, lumbosacral region without mention of lumbar back pain or lower extremity pain: Secondary | ICD-10-CM | POA: Insufficient documentation

## 2012-06-21 DIAGNOSIS — M5137 Other intervertebral disc degeneration, lumbosacral region: Secondary | ICD-10-CM | POA: Insufficient documentation

## 2012-06-21 HISTORY — PX: LUMBAR LAMINECTOMY/DECOMPRESSION MICRODISCECTOMY: SHX5026

## 2012-06-21 SURGERY — LUMBAR LAMINECTOMY/DECOMPRESSION MICRODISCECTOMY 1 LEVEL
Anesthesia: General | Site: Back | Laterality: Left | Wound class: Clean

## 2012-06-21 MED ORDER — ARTIFICIAL TEARS OP OINT: TOPICAL_OINTMENT | OPHTHALMIC | Status: DC | PRN

## 2012-06-21 MED ORDER — ZOLPIDEM TARTRATE 5 MG PO TABS: 10.0000 mg | ORAL_TABLET | Freq: Every evening | ORAL | Status: DC | PRN

## 2012-06-21 MED ORDER — FENTANYL CITRATE 0.05 MG/ML IJ SOLN
INTRAMUSCULAR | Status: DC | PRN
  Administered 2012-06-21 (×5): 50 ug via INTRAVENOUS

## 2012-06-21 MED ORDER — MAGNESIUM HYDROXIDE 400 MG/5ML PO SUSP: 30.0000 mL | Freq: Every day | ORAL | Status: DC | PRN

## 2012-06-21 MED ORDER — SODIUM CHLORIDE 0.9 % IV SOLN
INTRAVENOUS | Status: AC
  Filled 2012-06-21: qty 500

## 2012-06-21 MED ORDER — PHENOL 1.4 % MT LIQD: 1.0000 | OROMUCOSAL | Status: DC | PRN

## 2012-06-21 MED ORDER — HYDROMORPHONE HCL PF 1 MG/ML IJ SOLN
0.2500 mg | INTRAMUSCULAR | Status: DC | PRN
  Administered 2012-06-21: 0.5 mg via INTRAVENOUS

## 2012-06-21 MED ORDER — ROCURONIUM BROMIDE 100 MG/10ML IV SOLN
INTRAVENOUS | Status: DC | PRN
  Administered 2012-06-21: 50 mg via INTRAVENOUS

## 2012-06-21 MED ORDER — ONDANSETRON HCL 4 MG/2ML IJ SOLN: 4.0000 mg | Freq: Four times a day (QID) | INTRAMUSCULAR | Status: DC | PRN

## 2012-06-21 MED ORDER — FENTANYL CITRATE 0.05 MG/ML IJ SOLN
INTRAMUSCULAR | Status: AC
  Filled 2012-06-21: qty 2

## 2012-06-21 MED ORDER — LACTATED RINGERS IV SOLN
INTRAVENOUS | Status: DC | PRN
  Administered 2012-06-21 (×2): via INTRAVENOUS

## 2012-06-21 MED ORDER — OXYCODONE HCL 5 MG PO TABS
5.0000 mg | ORAL_TABLET | ORAL | Status: DC | PRN
  Administered 2012-06-21 – 2012-06-22 (×3): 10 mg via ORAL
  Filled 2012-06-21 (×3): qty 2

## 2012-06-21 MED ORDER — OXYCODONE HCL 5 MG/5ML PO SOLN: 5.0000 mg | Freq: Once | ORAL | Status: DC | PRN

## 2012-06-21 MED ORDER — CEFAZOLIN SODIUM-DEXTROSE 2-3 GM-% IV SOLR
INTRAVENOUS | Status: AC
  Filled 2012-06-21: qty 50

## 2012-06-21 MED ORDER — GLYCOPYRROLATE 0.2 MG/ML IJ SOLN
INTRAMUSCULAR | Status: DC | PRN
  Administered 2012-06-21: 0.4 mg via INTRAVENOUS

## 2012-06-21 MED ORDER — METHYLPREDNISOLONE ACETATE 80 MG/ML IJ SUSP
INTRAMUSCULAR | Status: DC | PRN
  Administered 2012-06-21: 80 mg

## 2012-06-21 MED ORDER — ACETAMINOPHEN 325 MG PO TABS: 650.0000 mg | ORAL_TABLET | ORAL | Status: DC | PRN

## 2012-06-21 MED ORDER — MIDAZOLAM HCL 5 MG/5ML IJ SOLN
INTRAMUSCULAR | Status: DC | PRN
  Administered 2012-06-21 (×2): 1 mg via INTRAVENOUS

## 2012-06-21 MED ORDER — KCL IN DEXTROSE-NACL 20-5-0.45 MEQ/L-%-% IV SOLN
INTRAVENOUS | Status: DC
  Administered 2012-06-21: 15:00:00 via INTRAVENOUS
  Filled 2012-06-21 (×4): qty 1000

## 2012-06-21 MED ORDER — BISACODYL 10 MG RE SUPP: 10.0000 mg | Freq: Every day | RECTAL | Status: DC | PRN

## 2012-06-21 MED ORDER — SODIUM CHLORIDE 0.9 % IJ SOLN: 3.0000 mL | INTRAMUSCULAR | Status: DC | PRN

## 2012-06-21 MED ORDER — SODIUM CHLORIDE 0.9 % IV SOLN: 250.0000 mL | INTRAVENOUS | Status: DC

## 2012-06-21 MED ORDER — SODIUM CHLORIDE 0.9 % IJ SOLN
3.0000 mL | Freq: Two times a day (BID) | INTRAMUSCULAR | Status: DC
  Administered 2012-06-21: 3 mL via INTRAVENOUS

## 2012-06-21 MED ORDER — BUPIVACAINE HCL (PF) 0.5 % IJ SOLN
INTRAMUSCULAR | Status: DC | PRN
  Administered 2012-06-21: 10 mL

## 2012-06-21 MED ORDER — LIDOCAINE-EPINEPHRINE 1 %-1:100000 IJ SOLN
INTRAMUSCULAR | Status: DC | PRN
  Administered 2012-06-21: 10 mL via INTRADERMAL

## 2012-06-21 MED ORDER — 0.9 % SODIUM CHLORIDE (POUR BTL) OPTIME
TOPICAL | Status: DC | PRN
  Administered 2012-06-21: 1000 mL

## 2012-06-21 MED ORDER — ONDANSETRON HCL 4 MG/2ML IJ SOLN
INTRAMUSCULAR | Status: DC | PRN
  Administered 2012-06-21: 4 mg via INTRAVENOUS

## 2012-06-21 MED ORDER — BACITRACIN 50000 UNITS IM SOLR
INTRAMUSCULAR | Status: AC
  Filled 2012-06-21: qty 1

## 2012-06-21 MED ORDER — KETOROLAC TROMETHAMINE 30 MG/ML IJ SOLN
30.0000 mg | Freq: Four times a day (QID) | INTRAMUSCULAR | Status: DC
  Administered 2012-06-21 – 2012-06-22 (×4): 30 mg via INTRAVENOUS
  Filled 2012-06-21 (×7): qty 1

## 2012-06-21 MED ORDER — FENTANYL CITRATE 0.05 MG/ML IJ SOLN
INTRAMUSCULAR | Status: DC | PRN
  Administered 2012-06-21: 100 ug via INTRAVENOUS

## 2012-06-21 MED ORDER — THROMBIN 5000 UNITS EX SOLR
CUTANEOUS | Status: DC | PRN
  Administered 2012-06-21 (×2): 5000 [IU] via TOPICAL

## 2012-06-21 MED ORDER — MORPHINE SULFATE 4 MG/ML IJ SOLN: 4.0000 mg | INTRAMUSCULAR | Status: DC | PRN

## 2012-06-21 MED ORDER — MENTHOL 3 MG MT LOZG: 1.0000 | LOZENGE | OROMUCOSAL | Status: DC | PRN

## 2012-06-21 MED ORDER — SODIUM CHLORIDE 0.9 % IR SOLN
Status: DC | PRN
  Administered 2012-06-21: 08:00:00

## 2012-06-21 MED ORDER — SIMVASTATIN 40 MG PO TABS
40.0000 mg | ORAL_TABLET | Freq: Every evening | ORAL | Status: DC
  Administered 2012-06-21: 40 mg via ORAL
  Filled 2012-06-21 (×3): qty 1

## 2012-06-21 MED ORDER — NEOSTIGMINE METHYLSULFATE 1 MG/ML IJ SOLN
INTRAMUSCULAR | Status: DC | PRN
  Administered 2012-06-21: 3 mg via INTRAVENOUS

## 2012-06-21 MED ORDER — ALUM & MAG HYDROXIDE-SIMETH 200-200-20 MG/5ML PO SUSP: 30.0000 mL | Freq: Four times a day (QID) | ORAL | Status: DC | PRN

## 2012-06-21 MED ORDER — HYDROMORPHONE HCL PF 1 MG/ML IJ SOLN
INTRAMUSCULAR | Status: AC
  Filled 2012-06-21: qty 1

## 2012-06-21 MED ORDER — ACETAMINOPHEN 10 MG/ML IV SOLN
INTRAVENOUS | Status: AC
  Administered 2012-06-21: 1000 mg via INTRAVENOUS
  Filled 2012-06-21: qty 100

## 2012-06-21 MED ORDER — PROPOFOL 10 MG/ML IV BOLUS
INTRAVENOUS | Status: DC | PRN
  Administered 2012-06-21: 150 mg via INTRAVENOUS

## 2012-06-21 MED ORDER — HEMOSTATIC AGENTS (NO CHARGE) OPTIME
TOPICAL | Status: DC | PRN
  Administered 2012-06-21: 1 via TOPICAL

## 2012-06-21 MED ORDER — HYDROXYZINE HCL 25 MG PO TABS: 50.0000 mg | ORAL_TABLET | ORAL | Status: DC | PRN

## 2012-06-21 MED ORDER — CYCLOBENZAPRINE HCL 10 MG PO TABS: 10.0000 mg | ORAL_TABLET | Freq: Three times a day (TID) | ORAL | Status: DC | PRN

## 2012-06-21 MED ORDER — ACETAMINOPHEN 10 MG/ML IV SOLN
1000.0000 mg | Freq: Four times a day (QID) | INTRAVENOUS | Status: DC
  Administered 2012-06-21 – 2012-06-22 (×2): 1000 mg via INTRAVENOUS
  Filled 2012-06-21 (×5): qty 100

## 2012-06-21 MED ORDER — KETOROLAC TROMETHAMINE 30 MG/ML IJ SOLN
INTRAMUSCULAR | Status: AC
  Filled 2012-06-21: qty 1

## 2012-06-21 MED ORDER — KETOROLAC TROMETHAMINE 30 MG/ML IJ SOLN: 30.0000 mg | Freq: Once | INTRAMUSCULAR | Status: DC

## 2012-06-21 MED ORDER — ACETAMINOPHEN 650 MG RE SUPP: 650.0000 mg | RECTAL | Status: DC | PRN

## 2012-06-21 MED ORDER — PROMETHAZINE HCL 25 MG/ML IJ SOLN: 6.2500 mg | INTRAMUSCULAR | Status: DC | PRN

## 2012-06-21 MED ORDER — OXYCODONE HCL 5 MG PO TABS: 5.0000 mg | ORAL_TABLET | Freq: Once | ORAL | Status: DC | PRN

## 2012-06-21 MED ORDER — LIDOCAINE HCL (CARDIAC) 20 MG/ML IV SOLN
INTRAVENOUS | Status: DC | PRN
  Administered 2012-06-21: 65 mg via INTRAVENOUS

## 2012-06-21 SURGICAL SUPPLY — 55 items
BAG DECANTER FOR FLEXI CONT (MISCELLANEOUS) ×2 IMPLANT
BENZOIN TINCTURE PRP APPL 2/3 (GAUZE/BANDAGES/DRESSINGS) IMPLANT
BLADE SURG ROTATE 9660 (MISCELLANEOUS) IMPLANT
BRUSH SCRUB EZ PLAIN DRY (MISCELLANEOUS) ×2 IMPLANT
BUR ACORN 6.0 ACORN (BURR) IMPLANT
BUR ACRON 5.0MM COATED (BURR) IMPLANT
BUR MATCHSTICK NEURO 3.0 LAGG (BURR) ×2 IMPLANT
CANISTER SUCTION 2500CC (MISCELLANEOUS) ×2 IMPLANT
CLOTH BEACON ORANGE TIMEOUT ST (SAFETY) ×2 IMPLANT
CONT SPEC 4OZ CLIKSEAL STRL BL (MISCELLANEOUS) ×2 IMPLANT
DERMABOND ADVANCED (GAUZE/BANDAGES/DRESSINGS) ×1
DERMABOND ADVANCED .7 DNX12 (GAUZE/BANDAGES/DRESSINGS) ×1 IMPLANT
DRAPE LAPAROTOMY 100X72X124 (DRAPES) ×2 IMPLANT
DRAPE MICROSCOPE LEICA (MISCELLANEOUS) ×2 IMPLANT
DRAPE POUCH INSTRU U-SHP 10X18 (DRAPES) ×2 IMPLANT
DRSG EMULSION OIL 3X3 NADH (GAUZE/BANDAGES/DRESSINGS) IMPLANT
ELECT REM PT RETURN 9FT ADLT (ELECTROSURGICAL) ×2
ELECTRODE REM PT RTRN 9FT ADLT (ELECTROSURGICAL) ×1 IMPLANT
GAUZE SPONGE 4X4 16PLY XRAY LF (GAUZE/BANDAGES/DRESSINGS) IMPLANT
GLOVE BIO SURGEON STRL SZ 6.5 (GLOVE) ×2 IMPLANT
GLOVE BIO SURGEON STRL SZ7 (GLOVE) ×2 IMPLANT
GLOVE BIOGEL PI IND STRL 8 (GLOVE) ×1 IMPLANT
GLOVE BIOGEL PI INDICATOR 8 (GLOVE) ×1
GLOVE ECLIPSE 7.5 STRL STRAW (GLOVE) ×2 IMPLANT
GLOVE EXAM NITRILE LRG STRL (GLOVE) IMPLANT
GLOVE EXAM NITRILE MD LF STRL (GLOVE) IMPLANT
GLOVE EXAM NITRILE XL STR (GLOVE) IMPLANT
GLOVE EXAM NITRILE XS STR PU (GLOVE) IMPLANT
GOWN BRE IMP SLV AUR LG STRL (GOWN DISPOSABLE) ×4 IMPLANT
GOWN BRE IMP SLV AUR XL STRL (GOWN DISPOSABLE) ×2 IMPLANT
GOWN STRL REIN 2XL LVL4 (GOWN DISPOSABLE) IMPLANT
KIT BASIN OR (CUSTOM PROCEDURE TRAY) ×2 IMPLANT
KIT ROOM TURNOVER OR (KITS) ×2 IMPLANT
NEEDLE HYPO 18GX1.5 BLUNT FILL (NEEDLE) IMPLANT
NEEDLE SPNL 18GX3.5 QUINCKE PK (NEEDLE) ×2 IMPLANT
NEEDLE SPNL 22GX3.5 QUINCKE BK (NEEDLE) ×2 IMPLANT
NS IRRIG 1000ML POUR BTL (IV SOLUTION) ×2 IMPLANT
PACK LAMINECTOMY NEURO (CUSTOM PROCEDURE TRAY) ×2 IMPLANT
PAD ARMBOARD 7.5X6 YLW CONV (MISCELLANEOUS) ×6 IMPLANT
PATTIES SURGICAL .5 X1 (DISPOSABLE) ×2 IMPLANT
RUBBERBAND STERILE (MISCELLANEOUS) ×4 IMPLANT
SPONGE GAUZE 4X4 12PLY (GAUZE/BANDAGES/DRESSINGS) IMPLANT
SPONGE LAP 4X18 X RAY DECT (DISPOSABLE) IMPLANT
SPONGE SURGIFOAM ABS GEL SZ50 (HEMOSTASIS) ×2 IMPLANT
STRIP CLOSURE SKIN 1/2X4 (GAUZE/BANDAGES/DRESSINGS) IMPLANT
SUT PROLENE 6 0 BV (SUTURE) IMPLANT
SUT VIC AB 1 CT1 18XBRD ANBCTR (SUTURE) ×1 IMPLANT
SUT VIC AB 1 CT1 8-18 (SUTURE) ×1
SUT VIC AB 2-0 CP2 18 (SUTURE) ×2 IMPLANT
SUT VIC AB 3-0 SH 8-18 (SUTURE) ×2 IMPLANT
SYR 20ML ECCENTRIC (SYRINGE) ×2 IMPLANT
SYR 5ML LL (SYRINGE) IMPLANT
TOWEL OR 17X24 6PK STRL BLUE (TOWEL DISPOSABLE) ×2 IMPLANT
TOWEL OR 17X26 10 PK STRL BLUE (TOWEL DISPOSABLE) ×2 IMPLANT
WATER STERILE IRR 1000ML POUR (IV SOLUTION) ×2 IMPLANT

## 2012-06-21 NOTE — H&P (Signed)
Subjective: Patient is a 62 y.o. male who is admitted for treatment of a left L3-4 extremities disc herniation.   Patient developed symptoms about a week and a half ago, initially with some mild discomfort, but the symptoms progressively worsened with pain extending into the upper left buttock, and the lateral left hip, and the anterior left thigh, knee, and leg. He's developed paresthesias the anterior left thigh, knee, and leg, but no numbness, but does ascribe a sense of weakness in the left lower extremity. He's been treated with prednisone Dosepak, hydrocodone, oxycodone, Lidoderm patch, Valium, gabapentin without relief. His best interest and with MRI scan revealed multilevel degeneration at the L3-4 L4-5 and L5-S1 levels, with a spondylolisthesis at L3-4, but more significantly a left L3-4 foraminal and extraforaminal disc herniation compressing the left L3 nerve root.  Patient is now for a left L3-4 excrement microdiscectomy.   Patient Active Problem List   Diagnosis Date Noted  . Sciatica 06/11/2012  . ABNORMAL ELECTROCARDIOGRAM 02/28/2009  . HYPERLIPIDEMIA 02/24/2009   Past Medical History  Diagnosis Date  . Hyperlipidemia   . Vertigo     hx of, x 1  . Arthritis     Past Surgical History  Procedure Laterality Date  . Cervical fusion    . Colonoscopy      Prescriptions prior to admission  Medication Sig Dispense Refill  . diazepam (VALIUM) 5 MG tablet Take 1 tablet (5 mg total) by mouth every 12 (twelve) hours as needed for sleep (and severe muscle spasm).  20 tablet  0  . gabapentin (NEURONTIN) 300 MG capsule Take 1 capsule (300 mg total) by mouth 2 (two) times daily.  60 capsule  0  . lidocaine (LIDODERM) 5 % Place 1 patch onto the skin daily. Remove & Discard patch within 12 hours.  30 patch  0  . oxyCODONE-acetaminophen (ROXICET) 5-325 MG per tablet Take 1 tablet by mouth every 4 (four) hours as needed for pain.  40 tablet  0  . predniSONE (DELTASONE) 10 MG tablet Take  10-40 mg by mouth See admin instructions. Take 4 tablets (40 mg) for 3 days, then take 3 tablets (30 mg) for 3 days, then take 2 tablets (20 mg) for 3 days, then take 1 tablet (10 mg) for 3 days      . simvastatin (ZOCOR) 40 MG tablet Take 1 tablet (40 mg total) by mouth every evening.  90 tablet  3  . naproxen sodium (ANAPROX DS) 550 MG tablet Take 1 tablet (550 mg total) by mouth 2 (two) times daily with a meal.  50 tablet  0   No Known Allergies  History  Substance Use Topics  . Smoking status: Never Smoker   . Smokeless tobacco: Not on file  . Alcohol Use: Yes     Comment: beer - every now and then    History reviewed. No pertinent family history.   Review of Systems A comprehensive review of systems was negative.  Objective: Vital signs in last 24 hours: Temp:  [97.6 F (36.4 C)-98.8 F (37.1 C)] 97.6 F (36.4 C) (05/24 0617) Pulse Rate:  [64-67] 67 (05/24 0617) Resp:  [20] 20 (05/24 0617) BP: (132-147)/(84-86) 147/84 mmHg (05/24 0617) SpO2:  [97 %-99 %] 99 % (05/24 0617) Weight:  [83.19 kg (183 lb 6.4 oz)] 83.19 kg (183 lb 6.4 oz) (05/23 1610)  EXAM: Patient is a well-developed well-nourished black male in no acute distress. Lungs are clear to auscultation , the patient has symmetrical respiratory  excursion. Heart has a regular rate and rhythm normal S1 and S2 no murmur.   Abdomen is soft nontender nondistended bowel sounds are present. Extremity examination shows no clubbing cyanosis or edema. Patient has no tenderness over the lumbar spinous processes and no tenderness over the lumbar paraspinal musculature. Range of motion shows the patient is able to flex to 90 degrees and is able to extend to 0-5 degrees. Straight leg raising is negative bilaterally. Motor examination shows 4/5 strength in the left iliopsoas otherwise the remainder the lower extremity strength is 5/5 including the left iliopsoas, and quadriceps dorsiflexor extensor hallicus  longus and plantar flexor  bilaterally. Sensation is intact to pinprick in the distal lower extremities. Reflexes show the left quadriceps is 1-2, the right quadriceps is 2-3, and she is up to 3 bilaterally. Toes are downgoing bilaterally. Patient has a normal gait and stance.   Data Review:CBC    Component Value Date/Time   WBC 7.3 06/20/2012 0844   RBC 5.05 06/20/2012 0844   HGB 15.0 06/20/2012 0844   HCT 42.4 06/20/2012 0844   PLT 264 06/20/2012 0844   MCV 84.0 06/20/2012 0844   MCH 29.7 06/20/2012 0844   MCHC 35.4 06/20/2012 0844   RDW 13.2 06/20/2012 0844     Assessment/Plan: Patient is left lumbar radiculopathy with weakness the left iliopsoas who has multilevel degenerative disease and spondylosis, and the degenerative spondylolisthesis at L3-4, but more significantly has a left L3-4 foraminal and extra foraminal disc herniation. He is admitted now for left L3-4 extra foraminal discectomy.  I've discussed with the patient the nature of his condition, the nature the surgical procedure, the typical length of surgery, hospital stay, and overall recuperation. We discussed limitations postoperatively. I discussed risks of surgery including risks of infection, bleeding, possibly need for transfusion, the risk of nerve root dysfunction with pain, weakness, numbness, or paresthesias, or risk of dural tear and CSF leakage and possible need for further surgery, the risk of recurrent disc herniation and the possible need for further surgery, the risk of increased spinal instability and possibly need for further surgery, and the risk of anesthetic complications including myocardial infarction, stroke, pneumonia, and death. Understanding all this the patient does wish to proceed with surgery and is admitted for such.    Hewitt Shorts, MD 06/21/2012 7:13 AM

## 2012-06-21 NOTE — Progress Notes (Signed)
Pt OOB ambulated to BR with 1 assist brace and walker, then sat in the chair    Minor, [employer]

## 2012-06-21 NOTE — Anesthesia Preprocedure Evaluation (Addendum)
Anesthesia Evaluation  Patient identified by MRN, date of birth, ID band Patient awake    Reviewed: Allergy & Precautions, H&P , NPO status , Patient's Chart, lab work & pertinent test results  Airway Mallampati: III  Neck ROM: Full    Dental  (+) Edentulous Upper and Edentulous Lower   Pulmonary  breath sounds clear to auscultation        Cardiovascular hypertension, negative cardio ROS  Rhythm:Regular Rate:Normal     Neuro/Psych  Neuromuscular disease    GI/Hepatic negative GI ROS, Neg liver ROS,   Endo/Other  negative endocrine ROS  Renal/GU negative Renal ROS     Musculoskeletal  (+) Arthritis -,   Abdominal   Peds  Hematology   Anesthesia Other Findings   Reproductive/Obstetrics                         Anesthesia Physical Anesthesia Plan  ASA: II  Anesthesia Plan: General   Post-op Pain Management:    Induction: Intravenous  Airway Management Planned: Oral ETT  Additional Equipment:   Intra-op Plan:   Post-operative Plan: Extubation in OR  Informed Consent: I have reviewed the patients History and Physical, chart, labs and discussed the procedure including the risks, benefits and alternatives for the proposed anesthesia with the patient or authorized representative who has indicated his/her understanding and acceptance.   Dental advisory given  Plan Discussed with: CRNA and Surgeon  Anesthesia Plan Comments:         Anesthesia Quick Evaluation

## 2012-06-21 NOTE — Preoperative (Signed)
Beta Blockers   Reason not to administer Beta Blockers:Not Applicable 

## 2012-06-21 NOTE — Transfer of Care (Signed)
Immediate Anesthesia Transfer of Care Note  Patient: Gregory Marshall  Procedure(s) Performed: Procedure(s) with comments: LUMBAR LAMINECTOMY/DECOMPRESSION MICRODISCECTOMY 1 LEVEL (Left) - LEFT L3-4 extraforaminal microdiskectomy  Patient Location: PACU  Anesthesia Type:General  Level of Consciousness: awake, alert  and oriented  Airway & Oxygen Therapy: Patient Spontanous Breathing and Patient connected to nasal cannula oxygen  Post-op Assessment: Report given to PACU RN and Post -op Vital signs reviewed and stable  Post vital signs: Reviewed and stable  Complications: No apparent anesthesia complications

## 2012-06-21 NOTE — Progress Notes (Signed)
Patient feeling "well" after surgery, wanted to sit on side of bed and eat lunch with brace on. Tolerating well. Explained to patient he was in a camera room. Will attempt ambulation soon  Minor, Morrie Sheldon AmeLie Hollars

## 2012-06-21 NOTE — Progress Notes (Signed)
Filed Vitals:   06/21/12 0617 06/21/12 1022  BP: 147/84 132/75  Pulse: 67 68  Temp: 97.6 F (36.4 C) 98.4 F (36.9 C)  TempSrc: Oral   Resp: 20 17  SpO2: 99% 100%    CBC  Recent Labs  06/20/12 0844  WBC 7.3  HGB 15.0  HCT 42.4  PLT 264    Patient resting comfortably in PACU, mild nausea. Has not yet voided. Wound clean dry. Moving all 4 extremities well.  Plan: Continue progress the postoperative recovery. Spoke with the patient's wife about procedure.  Hewitt Shorts, MD 06/21/2012, 10:42 AM

## 2012-06-21 NOTE — Op Note (Signed)
06/21/2012  10:06 AM  PATIENT:  Gregory Marshall  62 y.o. male  PRE-OPERATIVE DIAGNOSIS: Left L3-4 extraforaminal  lumbar herniated disc, Lumbar degenerative disc disease, lumbar spondylosis, lumbar radiculopathy  POST-OPERATIVE DIAGNOSIS: Left L3-4 extraforaminal  lumbar herniated disc, Lumbar degenerative disc disease, lumbar spondylosis, lumbar radiculopathy  PROCEDURE:  Procedure(s): LUMBAR LAMINECTOMY/DECOMPRESSION MICRODISCECTOMY 1 LEVEL: Left L3-4 extraforaminal microdiscectomy, with microdissection, microsurgical technique, and the operating microscope  SURGEON:  Surgeon(s): Hewitt Shorts, MD Tia Alert, MD  ASSISTANTS:David Barnett Applebaum, M.D.  ANESTHESIA:   general  EBL:  Total I/O In: 1000 [I.V.:1000] Out: 150 [Urine:50; Blood:100]  BLOOD ADMINISTERED:none  COUNT:  correct per nursing staff  DICTATION: Patient was brought to the operating room and placed under general endotracheal anesthesia. Patient was turned to prone position the lumbar region was prepped with Betadine soap and solution and draped in a sterile fashion. The midline was infiltrated with local anesthetic with epinephrine. A localizing x-ray was taken and the L3-4 level was identified. Midline incision was made over the L3-4 level and was carried down through the subcutaneous tissue to the lumbar fascia. The lumbar fascia was incised on the left side and the paraspinal muscles were dissected from the spinous processes and lamina in a subperiosteal fashion. Additional x-rays were taken and the L3-4 level was identified. The operating microscope was draped and brought into the field provided additional magnification, illumination, and visualization and the remainder the decompression was performed using microdissection and microsurgical technique.  We dissected laterally over the facet complex, identified the left L3 transverse process. We then dissected into the intertransverse space, dividing the intertransverse  muscle and ligament. We identified the exiting left L3 nerve root within the extraforaminal space, and found a fragment of herniated disc compressing and displacing the nerve root, extending posteriorly to the nerve root. This was removed. And then we dissected inferior medial to the nerve root and encountered significant additional amounts of herniated disc fragments which were removed in a piecemeal fashion, progressively decompressing the exiting left L3 nerve root. We then continued the discectomy into the L3-4 disc space and removed additional degenerated disc material. All loose disc fragments within the left L3-4 neural foramen and extraforaminal space removed, achieving good decompression of the left L3 nerve root. We then established hemostasis the use of bipolar cautery and Gelfoam with thrombin. And once the decompression was completed hemostasis tablet we proceeded with closure. The Gelfoam was removed and hemostasis confirmed. We then instilled 2 cc of fentanyl and 80 mg of Depo-Medrol into the extraforaminal space. Deep fascia was closed with interrupted undyed 1 Vicryl sutures. Scarpa's fascia was closed with interrupted undyed 1 Vicryl sutures in the subcutaneous and subcuticular layer were closed with interrupted inverted 2-0 and 3-0 undyed Vicryl sutures. The skin edges were approximated with Dermabond. Following surgery the patient was turned back to a supine position to be reversed from the anesthetic extubated and transferred to the recovery room for further care.   PLAN OF CARE: Admit for overnight observation  PATIENT DISPOSITION:  PACU - hemodynamically stable.   Delay start of Pharmacological VTE agent (>24hrs) due to surgical blood loss or risk of bleeding:  yes

## 2012-06-21 NOTE — Progress Notes (Signed)
Ambulated down hallway and back with brace, walker. Tolerated very well.   Minor, Gregory Marshall

## 2012-06-21 NOTE — Anesthesia Procedure Notes (Signed)
Procedure Name: Intubation Date/Time: 06/21/2012 7:59 AM Performed by: Quentin Ore Pre-anesthesia Checklist: Patient identified, Emergency Drugs available, Suction available, Patient being monitored and Timeout performed Patient Re-evaluated:Patient Re-evaluated prior to inductionOxygen Delivery Method: Circle system utilized Preoxygenation: Pre-oxygenation with 100% oxygen Intubation Type: IV induction Ventilation: Mask ventilation without difficulty and Oral airway inserted - appropriate to patient size Laryngoscope Size: Mac and 4 Grade View: Grade II Tube type: Oral Tube size: 7.5 mm Number of attempts: 1 Airway Equipment and Method: Stylet Placement Confirmation: ETT inserted through vocal cords under direct vision,  positive ETCO2 and breath sounds checked- equal and bilateral Secured at: 19 cm Tube secured with: Tape Dental Injury: Teeth and Oropharynx as per pre-operative assessment

## 2012-06-21 NOTE — Anesthesia Postprocedure Evaluation (Signed)
  Anesthesia Post-op Note  Patient: Gregory Marshall  Procedure(s) Performed: Procedure(s) with comments: LUMBAR LAMINECTOMY/DECOMPRESSION MICRODISCECTOMY 1 LEVEL (Left) - LEFT L3-4 extraforaminal microdiskectomy  Patient Location: PACU  Anesthesia Type:General  Level of Consciousness: awake  Airway and Oxygen Therapy: Patient Spontanous Breathing  Post-op Pain: mild  Post-op Assessment: Post-op Vital signs reviewed  Post-op Vital Signs: stable  Complications: No apparent anesthesia complications

## 2012-06-22 MED ORDER — OXYCODONE-ACETAMINOPHEN 10-325 MG PO TABS: 1.0000 | ORAL_TABLET | ORAL | Status: DC | PRN

## 2012-06-22 NOTE — Progress Notes (Signed)
Per night RN, ambulated down hallway with her last night. This am, I walked down hallway with patient with brace, no walker. Pt very steady and ambulated no problems without walker. Back to room with minimal pain.  Minor, Gregory Marshall

## 2012-06-22 NOTE — Discharge Summary (Signed)
Physician Discharge Summary  Patient ID: Gregory Marshall MRN: [id number] DOB/AGE: 1950-11-25 62 y.o.  Admit date: 06/21/2012 Discharge date: 06/22/2012  Admission Diagnoses: Left L3-4 far lateral herniated disc, lumbago, lumbar radiculopathy  Discharge Diagnoses: The same Active Problems:   * No active hospital problems. *   Discharged Condition: good  Hospital Course: Dr. Newell Coral performed a left L3-4 far lateral discectomy on the patient on 06/21/2012.  The patient's postoperative course was unremarkable. On postop day #1 the patient's leg felt better. He requested discharge to home. He was given oral and written discharge instructions. All his questions were answered.  Consults: None Significant Diagnostic Studies: None Treatments: Left L3-4 far lateral discectomy using microdissection Discharge Exam: Blood pressure 123/70, pulse 77, temperature 98.1 F (36.7 C), temperature source Oral, resp. rate 16, height 5\' 5"  (1.651 m), weight 83.19 kg (183 lb 6.4 oz), SpO2 97.00%. Patient is alert and pleasant. His strength is normal his lower extremities. His wound is healing well without discharge or signs of infection.  Disposition: Home  Discharge Orders   Future Orders Complete By Expires     Call MD for:  difficulty breathing, headache or visual disturbances  As directed     Call MD for:  extreme fatigue  As directed     Call MD for:  hives  As directed     Call MD for:  persistant dizziness or light-headedness  As directed     Call MD for:  persistant nausea and vomiting  As directed     Call MD for:  redness, tenderness, or signs of infection (pain, swelling, redness, odor or green/yellow discharge around incision site)  As directed     Call MD for:  severe uncontrolled pain  As directed     Call MD for:  temperature >100.4  As directed     Diet - low sodium heart healthy  As directed     Discharge instructions  As directed     Comments:      Call [phone] for a  followup appointment. Take a stool softener while you are using pain medications.    Driving Restrictions  As directed     Comments:      Do not drive for 2 weeks.    Increase activity slowly  As directed     Lifting restrictions  As directed     Comments:      Do not lift more than 5 pounds. No excessive bending or twisting.    May shower / Bathe  As directed     Comments:      He may shower after the pain she is removed 3 days after surgery. Leave the incision alone.    No dressing needed  As directed         Medication List    STOP taking these medications       lidocaine 5 %  Commonly known as:  LIDODERM     predniSONE 10 MG tablet  Commonly known as:  DELTASONE      TAKE these medications       diazepam 5 MG tablet  Commonly known as:  VALIUM  Take 1 tablet (5 mg total) by mouth every 12 (twelve) hours as needed for sleep (and severe muscle spasm).     gabapentin 300 MG capsule  Commonly known as:  NEURONTIN  Take 1 capsule (300 mg total) by mouth 2 (two) times daily.     naproxen sodium 550 MG tablet  Commonly known as:  ANAPROX DS  Take 1 tablet (550 mg total) by mouth 2 (two) times daily with a meal.     oxyCODONE-acetaminophen 5-325 MG per tablet  Commonly known as:  ROXICET  Take 1 tablet by mouth every 4 (four) hours as needed for pain.     oxyCODONE-acetaminophen 10-325 MG per tablet  Commonly known as:  PERCOCET  Take 1 tablet by mouth every 4 (four) hours as needed for pain.     simvastatin 40 MG tablet  Commonly known as:  ZOCOR  Take 1 tablet (40 mg total) by mouth every evening.         SignedCristi Loron 06/22/2012, 9:24 AM

## 2012-06-24 ENCOUNTER — Encounter (HOSPITAL_COMMUNITY): Payer: Self-pay | Admitting: Neurosurgery

## 2012-06-29 ENCOUNTER — Ambulatory Visit (INDEPENDENT_AMBULATORY_CARE_PROVIDER_SITE_OTHER): Payer: 59 | Admitting: Family Medicine

## 2012-06-29 ENCOUNTER — Ambulatory Visit: Payer: 59

## 2012-06-29 VITALS — BP 125/82 | HR 77 | Temp 98.1°F | Resp 18 | Wt 187.0 lb

## 2012-06-29 DIAGNOSIS — M25569 Pain in unspecified knee: Secondary | ICD-10-CM

## 2012-06-29 DIAGNOSIS — M129 Arthropathy, unspecified: Secondary | ICD-10-CM

## 2012-06-29 DIAGNOSIS — M1990 Unspecified osteoarthritis, unspecified site: Secondary | ICD-10-CM

## 2012-06-29 DIAGNOSIS — M25562 Pain in left knee: Secondary | ICD-10-CM

## 2012-06-29 DIAGNOSIS — M76892 Other specified enthesopathies of left lower limb, excluding foot: Secondary | ICD-10-CM

## 2012-06-29 DIAGNOSIS — M6580 Other synovitis and tenosynovitis, unspecified site: Secondary | ICD-10-CM

## 2012-06-29 NOTE — Progress Notes (Signed)
[facility]:  Office Visit  Chief Complaint:  Chief Complaint  Patient presents with  . Knee Pain    left, swelling    HPI: Gregory Marshall is a 62 y.o. male who complains of  Left knee pain x 1 day, 9/10 sharp constant pain started  last night radaiting down to anterior shin. He now has 6/10 pain He had recent L spine laminectomy, 1 week ago. Not on blood thinner. No fevers, chills.No redness, no calf pain, no h/o gout. NKI.Currently on norco and also gabapentin, no NSAID  Past Medical History  Diagnosis Date  . Hyperlipidemia   . Vertigo     hx of, x 1  . Arthritis    Past Surgical History  Procedure Laterality Date  . Cervical fusion    . Colonoscopy    . Lumbar laminectomy/decompression microdiscectomy Left 06/21/2012    Procedure: LUMBAR LAMINECTOMY/DECOMPRESSION MICRODISCECTOMY 1 LEVEL;  Surgeon: Hewitt Shorts, MD;  Location: [facility];  Service: Neurosurgery;  Laterality: Left;  LEFT L3-4 extraforaminal microdiskectomy   History   Social History  . Marital Status: Married    Spouse Name: N/A    Number of Children: N/A  . Years of Education: N/A   Social History Main Topics  . Smoking status: Never Smoker   . Smokeless tobacco: None  . Alcohol Use: Yes     Comment: beer - every now and then  . Drug Use: No  . Sexually Active: Yes   Other Topics Concern  . None   Social History Narrative  . None   No family history on file. No Known Allergies Prior to Admission medications   Medication Sig Start Date End Date Taking? Authorizing Provider  gabapentin (NEURONTIN) 300 MG capsule Take 1 capsule (300 mg total) by mouth 2 (two) times daily. 06/12/12  Yes Sherren Mocha, MD  naproxen sodium (ANAPROX DS) 550 MG tablet Take 1 tablet (550 mg total) by mouth 2 (two) times daily with a meal. 08/29/11 08/28/12 Yes Phillips Odor, MD  oxyCODONE-acetaminophen (PERCOCET) 10-325 MG per tablet Take 1 tablet by mouth every 4 (four) hours as needed for  pain. 06/22/12  Yes Cristi Loron, MD  oxyCODONE-acetaminophen (ROXICET) 5-325 MG per tablet Take 1 tablet by mouth every 4 (four) hours as needed for pain. 06/11/12  Yes Phillips Odor, MD  simvastatin (ZOCOR) 40 MG tablet Take 1 tablet (40 mg total) by mouth every evening. 04/27/11  Yes Gwenlyn Found Copland, MD  diazepam (VALIUM) 5 MG tablet Take 1 tablet (5 mg total) by mouth every 12 (twelve) hours as needed for sleep (and severe muscle spasm). 06/12/12   Sherren Mocha, MD     ROS: The patient denies fevers, chills, night sweats, unintentional weight loss, chest pain, palpitations, wheezing, dyspnea on exertion, nausea, vomiting, abdominal pain, dysuria, hematuria, melena, numbness, weakness, or tingling.   All other systems have been reviewed and were otherwise negative with the exception of those mentioned in the HPI and as above.    PHYSICAL EXAM: Filed Vitals:   06/29/12 0826  BP: 125/82  Pulse: 77  Temp: 98.1 F (36.7 C)  Resp: 18   Filed Vitals:   06/29/12 0826  Weight: 187 lb (84.823 kg)   Body mass index is 31.12 kg/(m^2).  General: Alert, no acute distress HEENT:  Normocephalic, atraumatic, oropharynx patent.  Cardiovascular:  Regular rate and rhythm, no rubs murmurs or gallops.  No Carotid bruits, radial pulse intact. No pedal edema.  Respiratory: Clear to auscultation bilaterally.  No wheezes, rales, or rhonchi.  No cyanosis, no use of accessory musculature GI: No organomegaly, abdomen is soft and non-tender, positive bowel sounds.  No masses. Skin: No rashes. Neurologic: Facial musculature symmetric. Psychiatric: Patient is appropriate throughout our interaction. Lymphatic: No cervical lymphadenopathy Musculoskeletal: Gait intact. + deformities + left knee full ROM, 5/5 strength, sensation intact No warmth , min to no swelling, no crepitus. No jt line tenderness, stable to varus and valgus stress + DP   LABS: Results for orders placed during the hospital  encounter of 06/20/12  SURGICAL PCR SCREEN      Result Value Range   MRSA, PCR NEGATIVE  NEGATIVE   Staphylococcus aureus NEGATIVE  NEGATIVE  CBC      Result Value Range   WBC 7.3  4.0 - 10.5 K/uL   RBC 5.05  4.22 - 5.81 MIL/uL   Hemoglobin 15.0  13.0 - 17.0 g/dL   HCT 16.1  09.6 - 04.5 %   MCV 84.0  78.0 - 100.0 fL   MCH 29.7  26.0 - 34.0 pg   MCHC 35.4  30.0 - 36.0 g/dL   RDW 40.9  81.1 - 91.4 %   Platelets 264  150 - 400 K/uL     EKG/XRAY:   Primary read interpreted by Dr. Conley Rolls at [facility]. No acute fractures/dislocation of knee  + DJD/OA + chronic changes at tibial tuberosity    ASSESSMENT/PLAN: Encounter Diagnoses  Name Primary?  . Left knee pain Yes  . Arthritis   . Tendonitis of knee, left     No e/o infection, gout, DVT, or fracture/dislocation Monitor for signs or sxs of the above Advise to get neoprene sleeve for knee since we did not have hinge knee braces in his size Take naproxen PO BID with meals that he already has F/u prn   Waldemar Siegel PHUONG, DO 06/29/2012 10:28 AM

## 2012-07-14 ENCOUNTER — Telehealth: Payer: Self-pay

## 2012-07-14 NOTE — Telephone Encounter (Signed)
PATIENT WOULD LIKE TO PICK UP XRAYS THAT HE HAD DONE OF HIS LEFT KNEE [phone] WHEN READY TO PICK UP

## 2012-07-15 NOTE — Telephone Encounter (Signed)
French Ana called pt to pickup xray.

## 2012-07-17 ENCOUNTER — Ambulatory Visit (INDEPENDENT_AMBULATORY_CARE_PROVIDER_SITE_OTHER): Payer: 59 | Admitting: Family Medicine

## 2012-07-17 VITALS — BP 122/78 | HR 74 | Temp 98.1°F | Resp 18 | Ht 66.0 in | Wt 187.0 lb

## 2012-07-17 DIAGNOSIS — M129 Arthropathy, unspecified: Secondary | ICD-10-CM

## 2012-07-17 DIAGNOSIS — M25569 Pain in unspecified knee: Secondary | ICD-10-CM

## 2012-07-17 DIAGNOSIS — M1990 Unspecified osteoarthritis, unspecified site: Secondary | ICD-10-CM

## 2012-07-17 DIAGNOSIS — T148XXA Other injury of unspecified body region, initial encounter: Secondary | ICD-10-CM

## 2012-07-17 DIAGNOSIS — M25562 Pain in left knee: Secondary | ICD-10-CM

## 2012-07-17 MED ORDER — MELOXICAM 15 MG PO TABS: 15.0000 mg | ORAL_TABLET | Freq: Every day | ORAL | Status: DC | PRN

## 2012-07-17 NOTE — Progress Notes (Signed)
[facility]:  Office Visit  Chief Complaint:  Chief Complaint  Patient presents with  . Follow-up    left knee    HPI: Gregory Marshall is a 62 y.o. male who complains of  Recheck of Left knee pain 4/10 pain, non-radiating better than before but feels his quads may be a little weaker Has taken Naproxen off and on Does not want to take steroids since was on it recently for back surgery, he is not on any pain meds except intermittent Naproxen Has used otc knee brace without relief We did xrays on last visit and it shows djd changes where he has [employer]   Last visit he had c/o: Kroy Sprung is a 62 y.o. male who complains of Left knee pain x 1 day, 9/10 sharp constant pain started last night radaiting down to anterior shin. He now has 6/10 pain  He had recent L spine laminectomy, 1 week ago. Not on blood thinner. No fevers, chills.No redness, no calf pain, no h/o gout. NKI.Currently on norco and also gabapentin, no NSAID   Past Medical History  Diagnosis Date  . Hyperlipidemia   . Vertigo     hx of, x 1  . Arthritis    Past Surgical History  Procedure Laterality Date  . Cervical fusion    . Colonoscopy    . Lumbar laminectomy/decompression microdiscectomy Left 06/21/2012    Procedure: LUMBAR LAMINECTOMY/DECOMPRESSION MICRODISCECTOMY 1 LEVEL;  Surgeon: Hewitt Shorts, MD;  Location: [facility];  Service: Neurosurgery;  Laterality: Left;  LEFT L3-4 extraforaminal microdiskectomy   History   Social History  . Marital Status: Married    Spouse Name: N/A    Number of Children: N/A  . Years of Education: N/A   Social History Main Topics  . Smoking status: Never Smoker   . Smokeless tobacco: None  . Alcohol Use: Yes     Comment: beer - every now and then  . Drug Use: No  . Sexually Active: Yes   Other Topics Concern  . None   Social History Narrative  . None   History reviewed. No pertinent family history. No Known  Allergies Prior to Admission medications   Medication Sig Start Date End Date Taking? Authorizing Provider  gabapentin (NEURONTIN) 300 MG capsule Take 1 capsule (300 mg total) by mouth 2 (two) times daily. 06/12/12  Yes Sherren Mocha, MD  simvastatin (ZOCOR) 40 MG tablet Take 1 tablet (40 mg total) by mouth every evening. 04/27/11  Yes Gwenlyn Found Copland, MD  diazepam (VALIUM) 5 MG tablet Take 1 tablet (5 mg total) by mouth every 12 (twelve) hours as needed for sleep (and severe muscle spasm). 06/12/12   Sherren Mocha, MD  naproxen sodium (ANAPROX DS) 550 MG tablet Take 1 tablet (550 mg total) by mouth 2 (two) times daily with a meal. 08/29/11 08/28/12  Phillips Odor, MD  oxyCODONE-acetaminophen (PERCOCET) 10-325 MG per tablet Take 1 tablet by mouth every 4 (four) hours as needed for pain. 06/22/12   Cristi Loron, MD  oxyCODONE-acetaminophen (ROXICET) 5-325 MG per tablet Take 1 tablet by mouth every 4 (four) hours as needed for pain. 06/11/12   Phillips Odor, MD     ROS: The patient denies fevers, chills, night sweats, unintentional weight loss, chest pain, palpitations, wheezing, dyspnea on exertion, nausea, vomiting, abdominal pain, dysuria, hematuria, melena, numbness, weakness, or tingling.  All other systems have been reviewed and were otherwise negative with the exception  of those mentioned in the HPI and as above.    PHYSICAL EXAM: Filed Vitals:   07/17/12 1549  BP: 122/78  Pulse: 74  Temp: 98.1 F (36.7 C)  Resp: 18   Filed Vitals:   07/17/12 1549  Height: 5\' 6"  (1.676 m)  Weight: 187 lb (84.823 kg)   Body mass index is 30.2 kg/(m^2).  General: Alert, no acute distress HEENT:  Normocephalic, atraumatic, oropharynx patent.  Cardiovascular:  Regular rate and rhythm, no rubs murmurs or gallops.  No Carotid bruits, radial pulse intact. No pedal edema.  Respiratory: Clear to auscultation bilaterally.  No wheezes, rales, or rhonchi.  No cyanosis, no use of accessory musculature GI:  No organomegaly, abdomen is soft and non-tender, positive bowel sounds.  No masses. Skin: No rashes. Neurologic: Facial musculature symmetric. Psychiatric: Patient is appropriate throughout our interaction. Lymphatic: No cervical lymphadenopathy Musculoskeletal: Gait intact. Left knee + deformities, tender anterior knee at lateral edge and at Anterior Tibia Tubercle  + left knee full ROM, 5/5 strength, sensation intact  No warmth , min to no swelling, no crepitus. No jt line tenderness, stable to varus and valgus stress  + DP    LABS: Results for orders placed during the hospital encounter of 06/20/12  SURGICAL PCR SCREEN      Result Value Range   MRSA, PCR NEGATIVE  NEGATIVE   Staphylococcus aureus NEGATIVE  NEGATIVE  CBC      Result Value Range   WBC 7.3  4.0 - 10.5 K/uL   RBC 5.05  4.22 - 5.81 MIL/uL   Hemoglobin 15.0  13.0 - 17.0 g/dL   HCT 95.2  84.1 - 32.4 %   MCV 84.0  78.0 - 100.0 fL   MCH 29.7  26.0 - 34.0 pg   MCHC 35.4  30.0 - 36.0 g/dL   RDW 40.1  02.7 - 25.3 %   Platelets 264  150 - 400 K/uL     EKG/XRAY:   Primary read interpreted by Dr. Conley Rolls at [facility].   ASSESSMENT/PLAN: Encounter Diagnoses  Name Primary?  . Knee pain, left Yes  . Sprain and strain   . Arthritis    Rx knee brace Rx Mobic Gross sideeffects, risk and benefits, and alternatives of medications d/w patient. Patient is aware that all medications have potential sideeffects and we are unable to predict every sideeffect or drug-drug interaction that may occur. IF no improvement in 2 month after PT then will refer to ortho Rx PT    Zaquan Duffner PHUONG, DO 07/17/2012 5:33 PM

## 2012-08-05 ENCOUNTER — Encounter: Payer: Self-pay | Admitting: Family Medicine

## 2012-08-05 DIAGNOSIS — M25562 Pain in left knee: Secondary | ICD-10-CM

## 2012-10-03 ENCOUNTER — Ambulatory Visit (INDEPENDENT_AMBULATORY_CARE_PROVIDER_SITE_OTHER): Payer: 59 | Admitting: Family Medicine

## 2012-10-03 VITALS — BP 108/76 | HR 56 | Temp 97.5°F | Resp 18 | Ht 64.0 in | Wt 182.0 lb

## 2012-10-03 DIAGNOSIS — Z125 Encounter for screening for malignant neoplasm of prostate: Secondary | ICD-10-CM

## 2012-10-03 DIAGNOSIS — N529 Male erectile dysfunction, unspecified: Secondary | ICD-10-CM

## 2012-10-03 DIAGNOSIS — E7800 Pure hypercholesterolemia, unspecified: Secondary | ICD-10-CM

## 2012-10-03 LAB — LIPID PANEL
Cholesterol: 232 mg/dL — ABNORMAL HIGH (ref 0–200)
HDL: 47 mg/dL (ref >39)
Triglycerides: 137 mg/dL (ref <150)

## 2012-10-03 LAB — POCT CBC
Granulocyte percent: 58.4 %G (ref 37–80)
HCT, POC: 47 % (ref 43.5–53.7)
MCV: 90.4 fL (ref 80–97)
POC LYMPH PERCENT: 33.1 %L (ref 10–50)
RBC: 5.2 M/uL (ref 4.69–6.13)
RDW, POC: 14 %

## 2012-10-03 LAB — COMPREHENSIVE METABOLIC PANEL
Albumin: 3.8 g/dL (ref 3.5–5.2)
BUN: 12 mg/dL (ref 6–23)
Calcium: 9.1 mg/dL (ref 8.4–10.5)
Chloride: 103 mEq/L (ref 96–112)
Glucose, Bld: 87 mg/dL (ref 70–99)
Potassium: 4.5 mEq/L (ref 3.5–5.3)

## 2012-10-03 MED ORDER — SILDENAFIL CITRATE 50 MG PO TABS: 50.0000 mg | ORAL_TABLET | ORAL | Status: DC | PRN

## 2012-10-03 MED ORDER — SIMVASTATIN 40 MG PO TABS: 40.0000 mg | ORAL_TABLET | Freq: Every evening | ORAL | Status: DC

## 2012-10-03 NOTE — Progress Notes (Signed)
[facility] [address], [place] [address] [phone]  Date:  10/03/2012   Name:  Gregory Marshall   DOB:  10/22/1950   MRN:  [id number]  PCP:  Norberto Sorenson, MD    Chief Complaint: Medication Refill   History of Present Illness:  Gregory Marshall is a 62 y.o. very pleasant male patient who presents with the following:  He needs medication refills today.  He did have back surgery at the start of the summer; he is recovering well.   He last ate about 4.5 hours ago.  He does not want a flu shot today, but would like a PSA test.   He uses the viagra without issues, no chest pain  otherwise he has no concerns or complaints today  Patient Active Problem List   Diagnosis Date Noted  . Left knee pain 08/05/2012  . Sciatica 06/11/2012  . ABNORMAL ELECTROCARDIOGRAM 02/28/2009  . HYPERLIPIDEMIA 02/24/2009    Past Medical History  Diagnosis Date  . Hyperlipidemia   . Vertigo     hx of, x 1  . Arthritis     Past Surgical History  Procedure Laterality Date  . Cervical fusion    . Colonoscopy    . Lumbar laminectomy/decompression microdiscectomy Left 06/21/2012    Procedure: LUMBAR LAMINECTOMY/DECOMPRESSION MICRODISCECTOMY 1 LEVEL;  Surgeon: Hewitt Shorts, MD;  Location: [facility];  Service: Neurosurgery;  Laterality: Left;  LEFT L3-4 extraforaminal microdiskectomy    History  Substance Use Topics  . Smoking status: Never Smoker   . Smokeless tobacco: Not on file  . Alcohol Use: Yes     Comment: beer - every now and then    History reviewed. No pertinent family history.  No Known Allergies  Medication list has been reviewed and updated.  Current Outpatient Prescriptions on File Prior to Visit  Medication Sig Dispense Refill  . simvastatin (ZOCOR) 40 MG tablet Take 1 tablet (40 mg total) by mouth every evening.  90 tablet  3  . gabapentin (NEURONTIN) 300 MG capsule Take 1 capsule (300 mg total) by mouth 2 (two) times daily.  60 capsule  0  .  meloxicam (MOBIC) 15 MG tablet Take 1 tablet (15 mg total) by mouth daily as needed for pain. Take with food, No other NSAIDs  30 tablet  0   No current facility-administered medications on file prior to visit.    Review of Systems:  As per HPI- otherwise negative.   Physical Examination: Filed Vitals:   10/03/12 1529  BP: 108/76  Pulse: 56  Temp: 97.5 F (36.4 C)  Resp: 18   Filed Vitals:   10/03/12 1529  Height: 5\' 4"  (1.626 m)  Weight: 182 lb (82.555 kg)   Body mass index is 31.22 kg/(m^2). Ideal Body Weight: Weight in (lb) to have BMI = 25: 145.3  GEN: WDWN, NAD, Non-toxic, A & O x 3 HEENT: Atraumatic, Normocephalic. Neck supple. No masses, No LAD. Ears and Nose: No external deformity. CV: RRR, No M/G/R. No JVD. No thrill. No extra heart sounds. PULM: CTA B, no wheezes, crackles, rhonchi. No retractions. No resp. distress. No accessory muscle use. ABD: S, NT, ND, +BS. No rebound. No HSM. EXTR: No c/c/e NEURO Normal gait.  PSYCH: Normally interactive. Conversant. Not depressed or anxious appearing.  Calm demeanor.    Assessment and Plan: High cholesterol - Plan: simvastatin (ZOCOR) 40 MG tablet, POCT CBC, Comprehensive metabolic panel, Lipid panel  Erectile dysfunction - Plan: sildenafil (VIAGRA) 50  MG tablet  Screening for prostate cancer - Plan: PSA  Will plan further follow- up pending labs. Refilled meds as above  Signed Abbe Amsterdam, MD

## 2012-10-03 NOTE — Patient Instructions (Addendum)
Take care.  Please do come and see [place] for a complete physical soon! Remember to stay up to date on your colonoscopy (every 10 years) I will be in touch with your labs

## 2012-10-04 ENCOUNTER — Encounter: Payer: Self-pay | Admitting: Family Medicine

## 2012-10-06 ENCOUNTER — Ambulatory Visit (INDEPENDENT_AMBULATORY_CARE_PROVIDER_SITE_OTHER): Payer: 59 | Admitting: Internal Medicine

## 2012-10-06 ENCOUNTER — Ambulatory Visit: Payer: 59

## 2012-10-06 VITALS — BP 160/92 | HR 66 | Temp 97.4°F | Resp 20 | Ht 66.0 in | Wt 175.0 lb

## 2012-10-06 DIAGNOSIS — M549 Dorsalgia, unspecified: Secondary | ICD-10-CM

## 2012-10-06 DIAGNOSIS — R300 Dysuria: Secondary | ICD-10-CM

## 2012-10-06 LAB — POCT URINALYSIS DIPSTICK
Bilirubin, UA: NEGATIVE
Glucose, UA: NEGATIVE
Nitrite, UA: NEGATIVE
Spec Grav, UA: 1.025

## 2012-10-06 MED ORDER — OXYCODONE-ACETAMINOPHEN 5-325 MG PO TABS: 1.0000 | ORAL_TABLET | Freq: Three times a day (TID) | ORAL | Status: DC | PRN

## 2012-10-06 MED ORDER — TAMSULOSIN HCL 0.4 MG PO CAPS: 0.4000 mg | ORAL_CAPSULE | Freq: Every day | ORAL | Status: DC

## 2012-10-06 MED ORDER — ONDANSETRON HCL 4 MG PO TABS: 4.0000 mg | ORAL_TABLET | Freq: Three times a day (TID) | ORAL | Status: DC | PRN

## 2012-10-06 NOTE — Progress Notes (Signed)
   [address], [place] [address]   Phone [phone]  Subjective:    Patient ID: Gregory Marshall, male    DOB: [birth year], 62 y.o.   MRN: [id number]  HPI  Pt presents to clinic with acute onset of R sided flank pain that radiates around his abd towards his scrotum.  When the pain starts he gets nauseated and has vomited once.  He did not have an injury that he knows of.  He had trouble getting a urine sample - the pain is burning sensation.  He cannot get comfortable.  No change in bowel habits.  He has had a bulging disc but this feel completely different.  Review of Systems  Constitutional: Negative for fever and chills.  Gastrointestinal: Positive for nausea and vomiting. Negative for diarrhea and constipation.  Genitourinary: Positive for testicular pain (radiation from back). Negative for dysuria.  Musculoskeletal: Positive for back pain.       Objective:   Physical Exam  Constitutional: He is oriented to person, place, and time. He appears well-developed and well-nourished.  Pt appears uncomfortable, shifting in his seat during exam.  HENT:  Head: Normocephalic and atraumatic.  Right Ear: External ear normal.  Left Ear: External ear normal.  Eyes: Conjunctivae are normal.  Neck: Normal range of motion.  Cardiovascular: Normal rate, regular rhythm and normal heart sounds.   No murmur heard. Pulmonary/Chest: Effort normal and breath sounds normal.  Abdominal: There is no tenderness. There is no rebound, no guarding and no CVA tenderness.  Genitourinary: Penis normal. Right testis shows tenderness (mild). Right testis shows no mass and no swelling. Left testis shows no mass, no swelling and no tenderness.  Neurological: He is alert and oriented to person, place, and time.  Skin: Skin is warm and dry.  Psychiatric: He has a normal mood and affect. His behavior is normal. Judgment and thought content normal.    [facility] reading (PRIMARY) by  Dr. Merla Riches.  S/p L5 discectomy,  ?bony deformity at symphysis pubis.  Constipation. ?  Stone on R side.  Results for orders placed in visit on 10/06/12  POCT URINALYSIS DIPSTICK      Result Value Range   Color, UA yellow     Clarity, UA clear     Glucose, UA neg     Bilirubin, UA neg     Ketones, UA trace     Spec Grav, UA 1.025     Blood, UA trace     pH, UA 6.5     Protein, UA neg     Urobilinogen, UA 1.0     Nitrite, UA neg     Leukocytes, UA Negative          Assessment & Plan:  Back pain - Plan: POCT urinalysis dipstick, DG Abd 1 View, ondansetron (ZOFRAN) 4 MG tablet, tamsulosin (FLOMAX) 0.4 MG CAPS capsule, CT Abdomen Wo Contrast, Percocet for pain.  Dysuria - Plan: POCT urinalysis dipstick  I suspect that patient has a kidney stone with is labs and how he is acting.  He has trace blood without any white cells in his urine so we will do a CT urogram to have a definitive diagnosis.  Pt will RTC if any change.  He was sent home with a strainer and urine cup to return the stone if he passes it.  Benny Lennert PA-C 10/07/2012 8:52 AM  I have reviewed and agree with documentation. Robert P. Merla Riches, M.D.

## 2012-10-08 ENCOUNTER — Ambulatory Visit (INDEPENDENT_AMBULATORY_CARE_PROVIDER_SITE_OTHER): Payer: 59 | Admitting: Family Medicine

## 2012-10-08 ENCOUNTER — Ambulatory Visit
Admission: RE | Admit: 2012-10-08 | Discharge: 2012-10-08 | Disposition: A | Discharge status: Home / Self Care | Payer: 59 | Source: Ambulatory Visit | Attending: Family Medicine | Admitting: Family Medicine

## 2012-10-08 VITALS — BP 120/76 | HR 63 | Temp 98.7°F | Resp 16 | Ht 65.0 in | Wt 184.4 lb

## 2012-10-08 DIAGNOSIS — N200 Calculus of kidney: Secondary | ICD-10-CM

## 2012-10-08 LAB — BASIC METABOLIC PANEL
BUN: 12 mg/dL (ref 6–23)
Chloride: 101 mEq/L (ref 96–112)
Glucose, Bld: 104 mg/dL — ABNORMAL HIGH (ref 70–99)
Potassium: 3.9 mEq/L (ref 3.5–5.3)

## 2012-10-08 NOTE — Progress Notes (Addendum)
[facility] [address], [place] [address] [phone]  Date:  10/08/2012   Name:  Gregory Marshall   DOB:  Feb 17, 1950   MRN:  [id number]  PCP:  Norberto Sorenson, MD    Chief Complaint: Follow-up   History of Present Illness:  Gregory Marshall is a 62 y.o. very pleasant male patient who presents with the following:  Last here 2 days ago with acute right sided pain thought due to nephrolithiasis.  He was supposed to have a CT scan, but apparently he was never called about this.  He has returned today because he was still in pain and was not sure what to do He did pass something yesterday am- he caught it in his strainer and brought it to [place] in a jar  He is no longer vomiting.  He has short episodes of fairly severe pain, but otherwise he feels ok.  The pain is not as severe as it had been.   He is able to pass urine and is eating ok  Patient Active Problem List   Diagnosis Date Noted  . Left knee pain 08/05/2012  . Sciatica 06/11/2012  . ABNORMAL ELECTROCARDIOGRAM 02/28/2009  . HYPERLIPIDEMIA 02/24/2009    Past Medical History  Diagnosis Date  . Hyperlipidemia   . Vertigo     hx of, x 1  . Arthritis     Past Surgical History  Procedure Laterality Date  . Cervical fusion    . Colonoscopy    . Lumbar laminectomy/decompression microdiscectomy Left 06/21/2012    Procedure: LUMBAR LAMINECTOMY/DECOMPRESSION MICRODISCECTOMY 1 LEVEL;  Surgeon: Hewitt Shorts, MD;  Location: [facility];  Service: Neurosurgery;  Laterality: Left;  LEFT L3-4 extraforaminal microdiskectomy    History  Substance Use Topics  . Smoking status: Never Smoker   . Smokeless tobacco: Not on file  . Alcohol Use: Yes     Comment: beer - every now and then    History reviewed. No pertinent family history.  No Known Allergies  Medication list has been reviewed and updated.  Current Outpatient Prescriptions on File Prior to Visit  Medication Sig Dispense Refill  . ondansetron  (ZOFRAN) 4 MG tablet Take 1 tablet (4 mg total) by mouth every 8 (eight) hours as needed for nausea.  20 tablet  0  . oxyCODONE-acetaminophen (ROXICET) 5-325 MG per tablet Take 1 tablet by mouth every 8 (eight) hours as needed for pain.  20 tablet  0  . sildenafil (VIAGRA) 50 MG tablet Take 1 tablet (50 mg total) by mouth as needed for erectile dysfunction.  10 tablet  6  . simvastatin (ZOCOR) 40 MG tablet Take 1 tablet (40 mg total) by mouth every evening.  90 tablet  3  . tamsulosin (FLOMAX) 0.4 MG CAPS capsule Take 1 capsule (0.4 mg total) by mouth daily.  14 capsule  0   No current facility-administered medications on file prior to visit.    Review of Systems:  As per HPI- otherwise negative.   Physical Examination: Filed Vitals:   10/08/12 0857  BP: 120/76  Pulse: 63  Temp: 98.7 F (37.1 C)  Resp: 16   Filed Vitals:   10/08/12 0857  Height: 5\' 5"  (1.651 m)  Weight: 184 lb 6.4 oz (83.643 kg)   Body mass index is 30.69 kg/(m^2). Ideal Body Weight: Weight in (lb) to have BMI = 25: 149.9  GEN: WDWN, NAD, Non-toxic, A & O x 3, looks well HEENT: Atraumatic, Normocephalic. Neck supple.  No masses, No LAD.  Ears and Nose: No external deformity. CV: RRR, No M/G/R. No JVD. No thrill. No extra heart sounds. PULM: CTA B, no wheezes, crackles, rhonchi. No retractions. No resp. distress. No accessory muscle use. ABD: S, NT, ND, +BS. No rebound. No HSM.  Benign exam EXTR: No c/c/e NEURO Normal gait.  PSYCH: Normally interactive. Conversant. Not depressed or anxious appearing.  Calm demeanor.  GU: normal scrotum and contents.  Normal penis.  No tenderness, no inguinal hernia.    Assessment and Plan: Nephrolithiasis - Plan: Basic metabolic panel, CT Abdomen Pelvis Wo Contrast  probably persistent nephrolithiasis.  Send for CT today.  Await results and will call pt  CT ABDOMEN AND PELVIS WITHOUT CONTRAST  Technique: Multidetector CT imaging of the abdomen and pelvis was  performed  following the standard protocol without intravenous  contrast.  Comparison: None.  Findings: No focal abnormalities seen in the liver or spleen on  this study performed without intravenous contrast material. The  stomach, duodenum, pancreas, gallbladder, and adrenal glands are  unremarkable.  Left kidney has no evidence for stones or secondary changes. 12 mm  water density lesion in the upper pole is likely a cyst.  There is mild fullness of the right intrarenal collecting system  with right perinephric and periureteric edema. The secondary  changes in the right ureter extend down into the pelvis where a 1 -  2 mm stone is seen in the distal right ureter, about 1 cm proximal  to the UVJ. No left ureteral or bladder stones.  No abdominal aortic aneurysm. No free fluid or lymphadenopathy in  the abdomen.  Imaging through the pelvis shows trace intraperitoneal fluid.  There is no pelvic sidewall lymphadenopathy. There is some edema  within the extraperitoneal tissues of the right pelvic sidewall,  likely secondary to the distal ureteral stone.  Scattered diverticuli are noted in the left colon without  diverticulitis. The terminal ileum is normal. The appendix is  normal.  Bone windows reveal no worrisome lytic or sclerotic osseous  lesions. Degenerative changes are seen in the lower lumbar spine  and symphysis pubis.  IMPRESSION:  1 - 2 mm distal right ureteral stone with secondary changes in the  right kidney and ureter. There is mild right-sided hydroureter  nephrosis.   Called an discussed with him.  Small right ureteral stone. Continue flomax and pain medication as needed, strain urine.   Appt at [facility] urology made for next week.  Pt is aware of appt.  He will call or RTC if having more pain or any other problems  Scheduled appt for 10/14/12.   Arrive 2:15 for 2:30 appt Signed Abbe Amsterdam, MD  9/11:  Received BMP results  Results for orders placed in visit on 10/08/12   BASIC METABOLIC PANEL      Result Value Range   Sodium 135  135 - 145 mEq/L   Potassium 3.9  3.5 - 5.3 mEq/L   Chloride 101  96 - 112 mEq/L   CO2 26  19 - 32 mEq/L   Glucose, Bld 104 (*) 70 - 99 mg/dL   BUN 12  6 - 23 mg/dL   Creat 4.09 (*) 8.11 - 1.35 mg/dL   Calcium 8.9  8.4 - 91.4 mg/dL   His creatinine has gone up, likely due to stone.  Called but had to [facility].   Left detailed message. As long as his pain is better we are ok.  However if he is having  more pain, feeling bad or has any other concerns please come back in or call.  Otherwise plan to see urology on Wednesday as planned.    Called on 9/13 and was able to reach pt.  He does feel a lot better.  "Like night and day."  He will plan to see urology on the 16th and I will fax his labs to them.

## 2012-10-08 NOTE — Patient Instructions (Addendum)
Please proceed  [facility] at  [facility] for your CT scan. Once your scan is done you can go home, and I will call you with your results.

## 2012-12-04 ENCOUNTER — Other Ambulatory Visit: Payer: Self-pay

## 2012-12-07 ENCOUNTER — Ambulatory Visit (INDEPENDENT_AMBULATORY_CARE_PROVIDER_SITE_OTHER): Payer: 59 | Admitting: Internal Medicine

## 2012-12-07 VITALS — BP 112/68 | HR 73 | Temp 98.2°F | Resp 16 | Ht 65.0 in | Wt 179.6 lb

## 2012-12-07 DIAGNOSIS — R002 Palpitations: Secondary | ICD-10-CM

## 2012-12-07 DIAGNOSIS — R079 Chest pain, unspecified: Secondary | ICD-10-CM

## 2012-12-07 NOTE — Progress Notes (Addendum)
  Subjective:    Patient ID: Gregory Marshall, male    DOB: 05-17-1950, 62 y.o.   MRN: [id number]  HPIc/o palpitations intermittently for 2-3 days No assoc SOB, Chest pain, diaphoresis,nausea, dizziness, syncope, or weakness.  Serious cyclists-road 40 mi yesterday without symptoms/problems No cough or reflux  Patient Active Problem List   Diagnosis Date Noted  . Left knee pain 08/05/2012  . Sciatica 06/11/2012  . ABNORMAL ELECTROCARDIOGRAM 02/28/2009  . HYPERLIPIDEMIA 02/24/2009  hx same few yrs ago dx as due to caffiene--now tea drinker   Current outpatient prescriptions:ondansetron (ZOFRAN) 4 MG tablet, Take 1 tablet (4 mg total) by mouth every 8 (eight) hours as needed for nausea., Disp: 20 tablet, Rfl: 0;  oxyCODONE-acetaminophen (ROXICET) 5-325 MG per tablet, Take 1 tablet by mouth every 8 (eight) hours as needed for pain., Disp: 20 tablet, Rfl: 0;  sildenafil (VIAGRA) 50 MG tablet, Take 1 tablet (50 mg total) by mouth as needed for erectile dysfunction., Disp: 10 tablet, Rfl: 6 simvastatin (ZOCOR) 40 MG tablet, Take 1 tablet (40 mg total) by mouth every evening., Disp: 90 tablet, Rfl: 3;  tamsulosin (FLOMAX) 0.4 MG CAPS capsule, Take 1 capsule (0.4 mg total) by mouth daily., Disp: 14 capsule, Rfl: 0   Review of Systems  Constitutional: Negative for fever, activity change, appetite change, fatigue and unexpected weight change.  Eyes: Negative for visual disturbance.  Respiratory: Negative for shortness of breath and wheezing.   Cardiovascular: Negative for chest pain and leg swelling.  Gastrointestinal: Negative for abdominal pain.  Neurological: Negative for light-headedness and headaches.  Psychiatric/Behavioral: Negative for sleep disturbance.       Objective:   Physical Exam  Constitutional: He is oriented to person, place, and time. He appears well-developed and well-nourished.  Eyes: EOM are normal. Pupils are equal, round, and reactive to light.  Neck: No thyromegaly  present.  Cardiovascular: Normal rate, regular rhythm and normal heart sounds.  Exam reveals no gallop and no friction rub.   No murmur heard. No bruits  Pulmonary/Chest: Effort normal and breath sounds normal.  Musculoskeletal: He exhibits no edema.  Lymphadenopathy:    He has no cervical adenopathy.  Neurological: He is alert and oriented to person, place, and time. No cranial nerve deficit.  Psychiatric: He has a normal mood and affect.   EKG w/ nonspec ST-T wave changes that were present in 2011     Assessment & Plan:  Palpitations Cardiac risk=hyperlipidemia  Will set up CV eval for holter and ?stress testing w/ dr Eden Emms who saw him last F/u 1 mo to see Dr Clelia Croft to assess effects or restarting zocor

## 2012-12-24 ENCOUNTER — Ambulatory Visit (INDEPENDENT_AMBULATORY_CARE_PROVIDER_SITE_OTHER): Payer: 59 | Admitting: Cardiovascular Disease

## 2012-12-24 ENCOUNTER — Encounter: Payer: Self-pay | Admitting: Cardiovascular Disease

## 2012-12-24 ENCOUNTER — Encounter: Payer: Self-pay | Admitting: *Deleted

## 2012-12-24 VITALS — BP 130/82 | HR 60 | Ht 65.0 in | Wt 190.0 lb

## 2012-12-24 DIAGNOSIS — R002 Palpitations: Secondary | ICD-10-CM | POA: Insufficient documentation

## 2012-12-24 DIAGNOSIS — R9431 Abnormal electrocardiogram [ECG] [EKG]: Secondary | ICD-10-CM

## 2012-12-24 DIAGNOSIS — E785 Hyperlipidemia, unspecified: Secondary | ICD-10-CM

## 2012-12-24 NOTE — Assessment & Plan Note (Signed)
Normal exam Symptoms improved spontaneously  Given the extent of his cycling would f/u with stress echo

## 2012-12-24 NOTE — Assessment & Plan Note (Signed)
Consider changing to lipitor 40 mg to get LDL under 130  Plan per primary

## 2012-12-24 NOTE — Progress Notes (Signed)
Patient ID: Gregory Marshall, male   DOB: [birth year], 62 y.o.   MRN: [id number] Referred by Dr Merla Riches for palpitations. Seen by Dr Elsie Lincoln in 2007 Seen by me in 2011.  Negative w/u with normal stress echo History of elevated lipids.  History of abnormal ECG.  Had 2-3 days of palpitations Mostly when not exercising. Acitve cyclist.  No chest pain dyspnea or syncope.  Flip flops intermitantly worse at night. Drinks a lot of root beer but no alcohol or stimulants.  No edma.  No chest pain or pleuritc pain Nothing seems to bring palpitations on have not had any last week.  Compliant with cholesterol meds  LDL 158 in September    ROS: Denies fever, malais, weight loss, blurry vision, decreased visual acuity, cough, sputum, SOB, hemoptysis, pleuritic pain, palpitaitons, heartburn, abdominal pain, melena, lower extremity edema, claudication, or rash.  All other systems reviewed and negative   General: Affect appropriate Healthy:  appears stated age HEENT: normal Neck supple with no adenopathy JVP normal no bruits no thyromegaly Lungs clear with no wheezing and good diaphragmatic motion Heart:  S1/S2 no murmur,rub, gallop or click PMI normal Abdomen: benighn, BS positve, no tenderness, no AAA no bruit.  No HSM or HJR Distal pulses intact with no bruits No edema Neuro non-focal Skin warm and dry No muscular weakness  Medications Current Outpatient Prescriptions  Medication Sig Dispense Refill  . sildenafil (VIAGRA) 50 MG tablet Take 1 tablet (50 mg total) by mouth as needed for erectile dysfunction.  10 tablet  6  . simvastatin (ZOCOR) 40 MG tablet Take 1 tablet (40 mg total) by mouth every evening.  90 tablet  3   No current facility-administered medications for this visit.    Allergies Review of patient's allergies indicates no known allergies.  Family History: No family history on file.  Social History: History   Social History  . Marital Status: Married    Spouse Name: N/A   Number of Children: N/A  . Years of Education: N/A   Occupational History  . Not on file.   Social History Main Topics  . Smoking status: Never Smoker   . Smokeless tobacco: Not on file  . Alcohol Use: Yes     Comment: beer - every now and then  . Drug Use: No  . Sexual Activity: Yes   Other Topics Concern  . Not on file   Social History Narrative  . No narrative on file    Electrocardiogram:  11/9 SR rate 62 tall R wave in V1/V2 compared to 02/04/09 no repolarization abnormalities laterally ? Lead placement of tall R;s in V12 now as not present in 2011  Assessment and Plan

## 2012-12-24 NOTE — Patient Instructions (Signed)
The current medical regimen is effective;  continue present plan and medications.  Your physician has requested that you have a stress echocardiogram. For further information please visit [email or web address] Please follow instruction sheet as given.  Follow up as needed after testing.

## 2012-12-24 NOTE — Assessment & Plan Note (Signed)
Repol abnormalities gone In 2011 no HOCM  F/U echo as part of stress echo assess RV with tall R waves in V12

## 2012-12-29 ENCOUNTER — Other Ambulatory Visit: Payer: Self-pay | Admitting: *Deleted

## 2013-01-23 ENCOUNTER — Other Ambulatory Visit (HOSPITAL_COMMUNITY): Payer: 59

## 2013-02-06 ENCOUNTER — Encounter: Payer: Self-pay | Admitting: Internal Medicine

## 2013-02-06 ENCOUNTER — Ambulatory Visit (HOSPITAL_COMMUNITY): Payer: 59 | Attending: Internal Medicine

## 2013-02-06 DIAGNOSIS — E785 Hyperlipidemia, unspecified: Secondary | ICD-10-CM | POA: Insufficient documentation

## 2013-02-06 DIAGNOSIS — R9431 Abnormal electrocardiogram [ECG] [EKG]: Secondary | ICD-10-CM | POA: Insufficient documentation

## 2013-02-06 DIAGNOSIS — R002 Palpitations: Secondary | ICD-10-CM

## 2013-02-06 NOTE — Progress Notes (Signed)
Stress echo performed.

## 2013-02-09 ENCOUNTER — Telehealth: Payer: Self-pay | Admitting: Cardiovascular Disease

## 2013-02-09 NOTE — Telephone Encounter (Signed)
PT  AWARE OF STRESS ECHO RESULTS ./CY 

## 2013-02-09 NOTE — Telephone Encounter (Signed)
New message   Patient called stated someone called him today . Returning call back to nurse

## 2013-04-11 ENCOUNTER — Ambulatory Visit: Payer: 59 | Admitting: Physician Assistant

## 2013-04-11 ENCOUNTER — Encounter: Payer: Self-pay | Admitting: Physician Assistant

## 2013-04-11 VITALS — BP 124/84 | HR 60 | Temp 99.0°F | Resp 16 | Ht 67.0 in | Wt 196.0 lb

## 2013-04-11 DIAGNOSIS — Z0289 Encounter for other administrative examinations: Secondary | ICD-10-CM

## 2013-04-11 NOTE — Progress Notes (Signed)
This patient presents for DOT examination for fitness for duty.  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  Medical History: no  Any illness or injury in the last 5 years? no  Head/Brain Injuries, disorders or illnesses no  Seizures, epilepsy no  Eye disorders or impaired vision (except corrective lenses) no  Ear disorders, loss of hearing or balance no  Heart disease or heart attack; other cardiovascular condition no  Heart surgery (valve replacement/bypass, angioplasty, pacemaker) no  High blood pressure no  Muscular disease no  Shortness of breath no  Lung disease, emphysema, asthma, chronic bronchitis no  Kidney disease, dialysis no  Liver disease no  Digestive problems no  Diabetes or elevated blood sugar no  Nervious or psychiatric disorders, e.g., severe depression no  Loss of, or altered consciousness no  Fainting, dizziness no  Sleep disorders, pauses in breathing while asleep, daytime sleepiness, loud snoring no  Stroke or paralysis no  Missing or impaired hand, arm, foot, leg, finger, toe no  Spinal injury or disease no  Chronic low back pain no  Regular, frequent alcohol use no  Narcotic or habit forming drug use  Current Medications: Prior to Admission medications   Medication Sig Start Date End Date Taking? Authorizing Provider  sildenafil (VIAGRA) 50 MG tablet Take 1 tablet (50 mg total) by mouth as needed for erectile dysfunction. 10/03/12  Yes Gay Filler Copland, MD  simvastatin (ZOCOR) 40 MG tablet Take 1 tablet (40 mg total) by mouth every evening. 10/03/12  Yes Darreld Mclean, MD    Primary Care Provider: Delman Cheadle, MD Specialists: [facility] (evaluation for heart palpitations included a stress ECHO, that was normal)  Medical Examiner's Comments on Health History:  Lipitor and Viagra pose no risk to driving.  TESTING:  Vision Screening Comments: Peripheral vision  85 degrees both eyes Color test 8 of 8 colors  identified  Monocular Vision: no  Hearing Aid used for test: no Hearing Aid required to to meet standard: no Distance from individual at which forced whispered voice can first be heard:   RIGHT ear >5 feet; LEFT ear >5 feet If audiometer used, record hearing loss in decibels:  RIGHT ear average N/A dB  LEFT ear average N/A dB  BP 124/84  Pulse 60  Temp(Src) 99 F (37.2 C) (Oral)  Resp 16  Ht 5\' 7"  (1.702 m)  Wt 196 lb (88.905 kg)  BMI 30.69 kg/m2  SpO2 98% Pulse rate is regular  Urine Specimen: Specific Gravity 1.030, Protein NEG, Blood NEG, Sugar NEG  Other Testing: None indicated  PHYSICAL EXAMINATION:  1. no General Appearance 2. no Eyes   3. no Ears     4. no Mouth and Throat    5. no Heart     6. no Lungs and Chest, not including breast examination  7. no Abdomen and Viscera   8. no Vascular System    9. no Genitourinary System   10. no Extremities-Limb impaired.  11. no Spine, other musculoskeletal  12. no Neurological     Comments: normal exam  Certification Status: does meet standards for 2 year certificate.  Wearing corrective lenses: no Wearing hearing aid: no Accompanied by a N/A waiver/exemption Skill performance Evaluation (SPE) Certificate: no Driving within an exempt intracity zone: no Qualified by operation of 49 CFR 960.45: no  Certification expires 04/13/2015

## 2014-03-09 ENCOUNTER — Other Ambulatory Visit: Payer: Self-pay | Admitting: Family Medicine

## 2014-03-13 ENCOUNTER — Telehealth: Payer: Self-pay

## 2014-03-13 ENCOUNTER — Emergency Department (HOSPITAL_COMMUNITY)
Admission: EM | Admit: 2014-03-13 | Discharge: 2014-03-13 | Disposition: A | Discharge status: Home / Self Care | Payer: 59 | Attending: Emergency Medicine | Admitting: Emergency Medicine

## 2014-03-13 ENCOUNTER — Encounter (HOSPITAL_COMMUNITY): Payer: Self-pay | Admitting: Emergency Medicine

## 2014-03-13 ENCOUNTER — Emergency Department (HOSPITAL_COMMUNITY): Payer: 59

## 2014-03-13 ENCOUNTER — Other Ambulatory Visit: Payer: Self-pay | Admitting: Physician Assistant

## 2014-03-13 ENCOUNTER — Ambulatory Visit (INDEPENDENT_AMBULATORY_CARE_PROVIDER_SITE_OTHER): Payer: 59 | Admitting: Internal Medicine

## 2014-03-13 VITALS — BP 116/74 | HR 61 | Temp 98.3°F | Ht 66.5 in | Wt 193.0 lb

## 2014-03-13 DIAGNOSIS — Z8739 Personal history of other diseases of the musculoskeletal system and connective tissue: Secondary | ICD-10-CM | POA: Insufficient documentation

## 2014-03-13 DIAGNOSIS — E785 Hyperlipidemia, unspecified: Secondary | ICD-10-CM | POA: Insufficient documentation

## 2014-03-13 DIAGNOSIS — Z1389 Encounter for screening for other disorder: Secondary | ICD-10-CM

## 2014-03-13 DIAGNOSIS — R079 Chest pain, unspecified: Secondary | ICD-10-CM

## 2014-03-13 DIAGNOSIS — Z131 Encounter for screening for diabetes mellitus: Secondary | ICD-10-CM

## 2014-03-13 DIAGNOSIS — E7800 Pure hypercholesterolemia, unspecified: Secondary | ICD-10-CM

## 2014-03-13 DIAGNOSIS — Z1211 Encounter for screening for malignant neoplasm of colon: Secondary | ICD-10-CM

## 2014-03-13 DIAGNOSIS — R9431 Abnormal electrocardiogram [ECG] [EKG]: Secondary | ICD-10-CM | POA: Diagnosis present

## 2014-03-13 DIAGNOSIS — N529 Male erectile dysfunction, unspecified: Secondary | ICD-10-CM

## 2014-03-13 LAB — BASIC METABOLIC PANEL
ANION GAP: 6 (ref 5–15)
BUN: 10 mg/dL (ref 6–23)
CO2: 29 mmol/L (ref 19–32)
Calcium: 9 mg/dL (ref 8.4–10.5)
Chloride: 100 mmol/L (ref 96–112)
Creatinine, Ser: 0.97 mg/dL (ref 0.50–1.35)
GFR calc Af Amer: >90 mL/min (ref >90)
GFR, EST NON AFRICAN AMERICAN: 86 mL/min — AB (ref >90)
Glucose, Bld: 101 mg/dL — ABNORMAL HIGH (ref 70–99)
Potassium: 4.4 mmol/L (ref 3.5–5.1)
Sodium: 135 mmol/L (ref 135–145)

## 2014-03-13 LAB — CBC
HCT: 47.7 % (ref 39.0–52.0)
Hemoglobin: 17 g/dL (ref 13.0–17.0)
MCH: 29.6 pg (ref 26.0–34.0)
MCHC: 35.6 g/dL (ref 30.0–36.0)
MCV: 83 fL (ref 78.0–100.0)
Platelets: 236 10*3/uL (ref 150–400)
RBC: 5.75 MIL/uL (ref 4.22–5.81)
RDW: 12.6 % (ref 11.5–15.5)
WBC: 5.3 10*3/uL (ref 4.0–10.5)

## 2014-03-13 LAB — I-STAT TROPONIN, ED: TROPONIN I, POC: 0 ng/mL (ref 0.00–0.08)

## 2014-03-13 MED ORDER — ASPIRIN 81 MG PO CHEW
324.0000 mg | CHEWABLE_TABLET | Freq: Once | ORAL | Status: AC
  Administered 2014-03-13: 324 mg via ORAL

## 2014-03-13 MED ORDER — SODIUM CHLORIDE 0.9 % IV SOLN
INTRAVENOUS | Status: DC
  Administered 2014-03-13: 11:00:00 via INTRAVENOUS

## 2014-03-13 MED ORDER — SIMVASTATIN 40 MG PO TABS: 40.0000 mg | ORAL_TABLET | Freq: Every evening | ORAL | Status: DC

## 2014-03-13 NOTE — Discharge Instructions (Signed)
Workup is negative. Return for any new or worse symptoms. Make an appointment with your regular doctor as needed.

## 2014-03-13 NOTE — ED Notes (Signed)
MD at bedside. 

## 2014-03-13 NOTE — Telephone Encounter (Signed)
Patient was sent to [facility] to have more test done to make sure he was ok. Patient wanted to Thank Dr Brigitte Pulse for being concerned about him and sending him to [facility]. Per patient all the tests that were done there today came out fine. Patients call back number is [phone]

## 2014-03-13 NOTE — Progress Notes (Addendum)
03/13/2014 at 10:20 AM  Gregory Marshall / DOB: January 19, 1951 / MRN: [id number]  The patient has HYPERLIPIDEMIA; ABNORMAL ELECTROCARDIOGRAM; Sciatica; Left knee pain; and Palpitations on his problem list.  SUBJECTIVE  Chief compalaint: Medication Refill   History of present illness: Gregory Marshall is 63 y.o. well appearing male with a history of abnormal EKG presenting for a refill of his Viagra and cholesterol medication. He is interested in Cialis at this time given that it requires fewer doses.  He denies dizziness associated with the use of Sildenfil. He denies myalgia associated with the use of statin medication. His most recent lipids were drawn in 10/03/2012, showing a mild dyslipidemia.  He denies a personal history of CAD, however his brother did require CABG in the recent past. He denies a personal history of DM2. He has a three pack year history of smoking. He denies the use of ASA.    He reports getting a colonoscopy roughly 10 years ago and was told to come back after ten years per negative results.    He reports doing well overall.  He cycles 3-4 times per week for 30-100 miles during the cycling season. He is not physically active at this time in that regard, however he does work 60 hours per week at [employer] and is very physically active.    He  has a past medical history of Hyperlipidemia; Vertigo; and Arthritis.    He has a current medication list which includes the following prescription(s): sildenafil and simvastatin.  Gregory Marshall has No Known Allergies. He  reports that he has never smoked. He has never used smokeless tobacco. He reports that he drinks alcohol. He reports that he does not use illicit drugs. He  reports that he currently engages in sexual activity.  The patient  has past surgical history that includes Cervical fusion; Colonoscopy; and Lumbar laminectomy/decompression microdiscectomy (Left, 06/21/2012).  His family history includes Alzheimer's disease in his mother; Heart  disease (age of onset: 61) in his brother.  Review of Systems  Constitutional: Negative.   Eyes: Negative.   Respiratory: Negative for cough and shortness of breath.   Cardiovascular: Negative for chest pain and palpitations.  Gastrointestinal: Negative for heartburn, nausea, vomiting, abdominal pain, diarrhea and constipation.  Genitourinary: Negative.   Musculoskeletal: Negative for myalgias.  Skin: Negative.   Neurological: Negative for dizziness and headaches.  Psychiatric/Behavioral: Negative for depression. The patient is not nervous/anxious.     OBJECTIVE  His  height is 5' 6.5" (1.689 m) and weight is 193 lb (87.544 kg). His oral temperature is 98.3 F (36.8 C). His blood pressure is 116/74 and his pulse is 61. His oxygen saturation is 98%.  The patient's body mass index is 30.69 kg/(m^2).  Physical Exam  Constitutional: He appears well-developed and well-nourished. No distress.  Cardiovascular: Normal rate and regular rhythm.   Respiratory: Effort normal and breath sounds normal.  GI: Soft. Bowel sounds are normal.  Skin: He is not diaphoretic.   EKG:  NSR with new anterolateral ST segment depression.    No results found for this or any previous visit (from the past 24 hour(s)).  ASSESSMENT & PLAN  [name] was seen today for medication refill.  Diagnoses and all orders for this visit:  Labs cancelled in light EKG findings.    High cholesterol Orders: -     Cancel: CBC with Differential/Platelet -     Cancel: Lipid panel -     simvastatin (ZOCOR) 40 MG tablet; Take 1  tablet (40 mg total) by mouth every evening.  Erectile dysfunction, unspecified erectile dysfunction type Orders: -     Cancel: TSH  Screening for diabetes mellitus Orders: -     EKG 12-Lead -     Cancel: Comprehensive metabolic panel -     Cancel: POCT glycosylated hemoglobin (Hb A1C)  Anterior ST segment depression: Patient asymptomatic at this time.  IV placed and 324 ASA given PO.  EMS  called emergently to transport to [facility] for possible intervention.  Of note, EMS staff spent roughly 20 minutes evaluating EKG for a potentially life threatening EKG abnormality and possibly delaying a life saving intervention. Contact information for [facility] was provided by paramedic Hendricks Milo. He reports his shift supervisor is [name] at [phone].    03/15/2014 update: Patient stopped by clinic requested refill of ED medication.  He has been taking Viagra since 2014 without incident, and per above is requesting Cialis given longer half life.  ED workup negative for ischemia.  Patient had a stress in January of 2015 at the request of his cardiologist.  Impression is as follows: "The stress ECG was borderline positive in the anterolateral leads. However, the echo images were normal at rest and with stress so no evidence for ischemia. Good exercise tolerance with no chest pain. I suspect that thisis a normal study." In light of this information, his lack of symptoms in our office and negative workup at ED, and given he has tolerated ED class medications well in the past I will fill the Cialis per his request and will forward this note to his cardiologist.     The patient was advised to call or come back to clinic if he does not see an improvement in symptoms, or worsens with the above plan.  Philis Fendt, MHS, PA-C [facility] 03/13/2014 10:20 AM

## 2014-03-13 NOTE — ED Provider Notes (Signed)
CSN: [id number]     Arrival date & time 03/13/14  1043 History   First MD Initiated Contact with Patient 03/13/14 1044     Chief Complaint  Patient presents with  . Abnormal ECG     (Consider location/radiation/quality/duration/timing/severity/associated sxs/prior Treatment) The history is provided by the patient.   patient referred in from urgent care for concerns for EKG changes. Patient was given aspirin there. Patient denied any chest pain any shortness of breath. Patient's EKG showed inverted T waves anterior laterally. Patient does work for [employer] and does a lot of lifting throughout the day. Without any chest pain. That home in the evenings his shoulders get tight. Patient without any complaints currently. Patient did have a stress test a year ago.  Past Medical History  Diagnosis Date  . Hyperlipidemia   . Vertigo     hx of, x 1  . Arthritis    Past Surgical History  Procedure Laterality Date  . Cervical fusion    . Colonoscopy    . Lumbar laminectomy/decompression microdiscectomy Left 06/21/2012    Procedure: LUMBAR LAMINECTOMY/DECOMPRESSION MICRODISCECTOMY 1 LEVEL;  Surgeon: Hosie Spangle, MD;  Location: [place] [facility];  Service: Neurosurgery;  Laterality: Left;  LEFT L3-4 extraforaminal microdiskectomy   Family History  Problem Relation Age of Onset  . Alzheimer's disease Mother   . Heart disease Brother 78    CABG   History  Substance Use Topics  . Smoking status: Never Smoker   . Smokeless tobacco: Never Used  . Alcohol Use: 0.0 oz/week    0 drink(s) per week     Comment: beer - every now and then    Review of Systems  Constitutional: Negative for fever.  HENT: Negative for congestion.   Eyes: Negative for redness.  Respiratory: Negative for shortness of breath.   Cardiovascular: Negative for chest pain and leg swelling.  Gastrointestinal: Negative for nausea, vomiting and abdominal pain.  Genitourinary: Negative for dysuria.  Musculoskeletal: Negative  for back pain.  Skin: Negative for rash.  Neurological: Negative for headaches.  Hematological: Does not bruise/bleed easily.  Psychiatric/Behavioral: Negative for confusion.      Allergies  Review of patient's allergies indicates no known allergies.  Home Medications   Prior to Admission medications   Medication Sig Start Date End Date Taking? Authorizing Provider  simvastatin (ZOCOR) 40 MG tablet Take 1 tablet (40 mg total) by mouth every evening. 03/13/14  Yes Kathlen Brunswick, PA-C   BP 120/79 mmHg  Pulse 61  Temp(Src) 98.7 F (37.1 C) (Oral)  Resp 13  Ht 5\' 6"  (1.676 m)  Wt 180 lb (81.647 kg)  BMI 29.07 kg/m2  SpO2 99% Physical Exam  Constitutional: He is oriented to person, place, and time. He appears well-developed and well-nourished. No distress.  HENT:  Head: Normocephalic and atraumatic.  Mouth/Throat: Oropharynx is clear and moist.  Eyes: Conjunctivae and EOM are normal. Pupils are equal, round, and reactive to light.  Neck: Normal range of motion.  Cardiovascular: Normal rate, regular rhythm and normal heart sounds.   Pulmonary/Chest: Effort normal and breath sounds normal. No respiratory distress.  Abdominal: Soft. Bowel sounds are normal. There is no tenderness.  Musculoskeletal: Normal range of motion.  Neurological: He is alert and oriented to person, place, and time. No cranial nerve deficit. He exhibits normal muscle tone. Coordination normal.  Skin: Skin is warm. No rash noted.  Nursing note and vitals reviewed.   ED Course  Procedures (including critical care time) Labs  Review Labs Reviewed  BASIC METABOLIC PANEL - Abnormal; Notable for the following:    Glucose, Bld 101 (*)    GFR calc non Af Amer 86 (*)    All other components within normal limits  CBC  I-STAT TROPOININ, ED   Results for orders placed or performed during the hospital encounter of 03/13/14  CBC  Result Value Ref Range   WBC 5.3 4.0 - 10.5 K/uL   RBC 5.75 4.22 - 5.81  MIL/uL   Hemoglobin 17.0 13.0 - 17.0 g/dL   HCT 47.7 39.0 - 52.0 %   MCV 83.0 78.0 - 100.0 fL   MCH 29.6 26.0 - 34.0 pg   MCHC 35.6 30.0 - 36.0 g/dL   RDW 12.6 11.5 - 15.5 %   Platelets 236 150 - 400 K/uL  Basic metabolic panel  Result Value Ref Range   Sodium 135 135 - 145 mmol/L   Potassium 4.4 3.5 - 5.1 mmol/L   Chloride 100 96 - 112 mmol/L   CO2 29 19 - 32 mmol/L   Glucose, Bld 101 (H) 70 - 99 mg/dL   BUN 10 6 - 23 mg/dL   Creatinine, Ser 0.97 0.50 - 1.35 mg/dL   Calcium 9.0 8.4 - 10.5 mg/dL   GFR calc non Af Amer 86 (L) >90 mL/min   GFR calc Af Amer >90 >90 mL/min   Anion gap 6 5 - 15  I-stat troponin, ED (not at [facility])  Result Value Ref Range   Troponin i, poc 0.00 0.00 - 0.08 ng/mL   Comment 3            Imaging Review Dg Chest 2 View  03/13/2014   CLINICAL DATA:  Abnormal EKG.  No chest complaints.  EXAM: CHEST  2 VIEW  COMPARISON:  03/31/2005  FINDINGS: The heart size and mediastinal contours are within normal limits. Both lungs are clear. Degenerative changes in the mid thoracic spine. Previous anterior cervical fusion.  IMPRESSION: No active cardiopulmonary disease.   Electronically Signed   By: Nolon Nations M.D.   On: 03/13/2014 12:20     EKG Interpretation   Date/Time:  Saturday March 13 2014 10:52:32 EST Ventricular Rate:  65 PR Interval:  181 QRS Duration: 105 QT Interval:  434 QTC Calculation: 451 R Axis:   55 Text Interpretation:  Age not entered, assumed to be  64 years old for  purpose of ECG interpretation Sinus rhythm Probable left atrial  enlargement Abnormal T, probable ischemia, widespread Borderline ST  elevation, anterolateral leads No significant change since last tracing  Confirmed by Nikkita Adeyemi  MD, Skyleen Bentley ([id number]) on 03/13/2014 10:58:24 AM      MDM   Final diagnoses:  Chest pain   Patient referred in from urgent care. Patient went there for routine physical. Patient's EKG was noted to have flipped T waves anterior laterally.  Patient was not complaining of any chest pain. He works for [employer] usually has stiff shoulders every evening after work but has no problems with any pain developing at work. Patient's troponin here is negative. EKG changes that we are seeing were present in 2007. Patient's chest x-rays negative for any pneumonia pneumothorax or pulmonary edema. No leukocytosis no anemia. No electrolyte abnormalities. Also as stated troponin was negative. In addition patient had a stress test a year ago without any significant findings.   Fredia Sorrow, MD 03/13/14 1239

## 2014-03-13 NOTE — ED Notes (Signed)
Family at bedside. 

## 2014-03-13 NOTE — ED Notes (Signed)
Pt arrived by [facility] on [address]. Pt was visiting for routine check up and they did a EKG which showed inverted T waves that were not on previous EKGS. Pt received ASA 324mg . Pt denies any pain, sob, n/v/d. Stated that he feels like his normal self. Has been having bilateral shoulder pain but stated that he does a lot of lifting with his job and figured it was from that.

## 2014-03-15 ENCOUNTER — Other Ambulatory Visit: Payer: Self-pay | Admitting: Physician Assistant

## 2014-03-15 ENCOUNTER — Telehealth: Payer: Self-pay

## 2014-03-15 DIAGNOSIS — N529 Male erectile dysfunction, unspecified: Secondary | ICD-10-CM

## 2014-03-15 MED ORDER — TADALAFIL 20 MG PO TABS: 10.0000 mg | ORAL_TABLET | ORAL | Status: DC | PRN

## 2014-03-15 NOTE — Progress Notes (Signed)
Patient ruled out at ED for ischemia on 03/13/14.  Stress test in January of 15 documented in Epic under CV procedures and impression is as follows:"The stress ECG was borderline positive in theanterolateral leads. However, the echo images were normalat rest and with stress so no evidence for ischemia. Goodexercise tolerance with no chest pain. I suspect that thisis a normal study." Given negative workup in ED and reassuring stress test in early 15 will prescribe Cialis. Philis Fendt, MS, PA-C   1:23 PM, 03/15/2014

## 2014-03-15 NOTE — Telephone Encounter (Signed)
Pt came in inquiring about a RF of Viagra. I think Legrand Como forgot to prescribe it for him considering the circumstances. Can we send this in for pt. Thanks

## 2014-03-15 NOTE — Telephone Encounter (Signed)
Gregory Marshall,  Please review ED notes. Defer to you, as you did the physical. I would contact the patient's cardiologist (Dr. Johnsie Cancel) to make sure he is OK with this patient taking Viagra.

## 2014-03-19 NOTE — Telephone Encounter (Signed)
Looks like Michael sent in Cialis for pt.

## 2014-11-25 ENCOUNTER — Telehealth: Payer: Self-pay

## 2014-11-25 NOTE — Telephone Encounter (Signed)
Pt stopped in and would like to switch from Cialis to Viagra. He can be reached @ [phone]. Thank you

## 2014-11-26 NOTE — Telephone Encounter (Signed)
Gregory Marshall, so you want to write new Rx for Viagra instead of cialis?

## 2014-11-27 ENCOUNTER — Other Ambulatory Visit: Payer: Self-pay | Admitting: Physician Assistant

## 2014-11-27 DIAGNOSIS — N529 Male erectile dysfunction, unspecified: Secondary | ICD-10-CM

## 2014-11-27 MED ORDER — SILDENAFIL CITRATE 100 MG PO TABS: 50.0000 mg | ORAL_TABLET | Freq: Every day | ORAL | Status: DC | PRN

## 2014-11-27 NOTE — Telephone Encounter (Signed)
Sent to his pharmacy via epic. Please let him know.  Thank you Pamala Hurry.  Philis Fendt, MS, PA-C   7:56 PM, 11/27/2014

## 2014-11-29 NOTE — Telephone Encounter (Signed)
Notified pt. 

## 2015-06-25 ENCOUNTER — Other Ambulatory Visit: Payer: Self-pay | Admitting: Physician Assistant

## 2015-11-24 ENCOUNTER — Other Ambulatory Visit: Payer: Self-pay | Admitting: Internal Medicine

## 2015-11-24 ENCOUNTER — Ambulatory Visit
Admission: RE | Admit: 2015-11-24 | Discharge: 2015-11-24 | Disposition: A | Discharge status: Home / Self Care | Payer: 59 | Source: Ambulatory Visit | Attending: Internal Medicine | Admitting: Internal Medicine

## 2015-11-24 DIAGNOSIS — R05 Cough: Secondary | ICD-10-CM

## 2015-11-24 DIAGNOSIS — R059 Cough, unspecified: Secondary | ICD-10-CM

## 2016-01-16 ENCOUNTER — Other Ambulatory Visit: Payer: Self-pay | Admitting: Physician Assistant

## 2016-01-18 DIAGNOSIS — N529 Male erectile dysfunction, unspecified: Secondary | ICD-10-CM | POA: Insufficient documentation

## 2016-05-20 DIAGNOSIS — E559 Vitamin D deficiency, unspecified: Secondary | ICD-10-CM | POA: Insufficient documentation

## 2016-05-20 DIAGNOSIS — R7303 Prediabetes: Secondary | ICD-10-CM | POA: Insufficient documentation

## 2016-05-21 DIAGNOSIS — R030 Elevated blood-pressure reading, without diagnosis of hypertension: Secondary | ICD-10-CM | POA: Insufficient documentation

## 2016-05-21 DIAGNOSIS — E669 Obesity, unspecified: Secondary | ICD-10-CM | POA: Insufficient documentation

## 2016-06-26 ENCOUNTER — Encounter: Payer: Self-pay | Admitting: Internal Medicine

## 2016-12-10 ENCOUNTER — Other Ambulatory Visit: Payer: Self-pay | Admitting: Internal Medicine

## 2016-12-26 ENCOUNTER — Encounter: Payer: Self-pay | Admitting: Internal Medicine

## 2017-02-07 ENCOUNTER — Other Ambulatory Visit: Payer: Self-pay

## 2017-02-07 ENCOUNTER — Ambulatory Visit (AMBULATORY_SURGERY_CENTER): Payer: Self-pay | Admitting: *Deleted

## 2017-02-07 VITALS — Ht 65.0 in | Wt 191.8 lb

## 2017-02-07 DIAGNOSIS — Z1211 Encounter for screening for malignant neoplasm of colon: Secondary | ICD-10-CM

## 2017-02-07 NOTE — Progress Notes (Signed)
Patient denies any allergies to egg or soy products. Patient denies complications with anesthesia/sedation.  Patient denies oxygen use at home and denies diet medications. Patient denies information on colonoscopy. 

## 2017-02-13 ENCOUNTER — Encounter: Payer: Self-pay | Admitting: Internal Medicine

## 2017-02-21 ENCOUNTER — Encounter: Payer: Self-pay | Admitting: Internal Medicine

## 2017-02-21 ENCOUNTER — Other Ambulatory Visit: Payer: Self-pay

## 2017-02-21 ENCOUNTER — Ambulatory Visit (AMBULATORY_SURGERY_CENTER): Payer: 59 | Admitting: Internal Medicine

## 2017-02-21 VITALS — BP 107/74 | HR 50 | Temp 96.8°F | Resp 13 | Ht 66.5 in | Wt 193.0 lb

## 2017-02-21 DIAGNOSIS — Z1211 Encounter for screening for malignant neoplasm of colon: Secondary | ICD-10-CM | POA: Diagnosis not present

## 2017-02-21 DIAGNOSIS — Z1212 Encounter for screening for malignant neoplasm of rectum: Secondary | ICD-10-CM

## 2017-02-21 MED ORDER — SODIUM CHLORIDE 0.9 % IV SOLN: 500.0000 mL | Freq: Once | INTRAVENOUS | Status: DC

## 2017-02-21 NOTE — Progress Notes (Signed)
No allergy to soy or eggs. Pt's states no medical or surgical changes since previsit or office visit.Patient consents to observer being present for procedure.

## 2017-02-21 NOTE — Op Note (Signed)
[facility] Patient Name: Gregory Marshall Procedure Date: 02/21/2017 7:52 AM MRN: [id number] Endoscopist: Gatha Mayer , MD Age: 67 Referring MD:  Date of Birth: [birth year] Gender: Male Account #: [id number] Procedure:                Colonoscopy Indications:              Screening for colorectal malignant neoplasm Medicines:                Propofol per Anesthesia, Monitored Anesthesia Care Procedure:                Pre-Anesthesia Assessment:                           - Prior to the procedure, a History and Physical                            was performed, and patient medications and                            allergies were reviewed. The patient's tolerance of                            previous anesthesia was also reviewed. The risks                            and benefits of the procedure and the sedation                            options and risks were discussed with the patient.                            All questions were answered, and informed consent                            was obtained. Prior Anticoagulants: The patient has                            taken no previous anticoagulant or antiplatelet                            agents. ASA Grade Assessment: II - A patient with                            mild systemic disease. After reviewing the risks                            and benefits, the patient was deemed in                            satisfactory condition to undergo the procedure.                           After obtaining informed consent, the colonoscope  was passed under direct vision. Throughout the                            procedure, the patient's blood pressure, pulse, and                            oxygen saturations were monitored continuously. The                            Model CF-HQ190L [id number]) scope was introduced                            through the anus and advanced to the the cecum,        identified by appendiceal orifice and ileocecal                            valve. The ileocecal valve, appendiceal orifice,                            and rectum were photographed. The quality of the                            bowel preparation was good. The bowel preparation                            used was Miralax. Scope In: 7:57:43 AM Scope Out: 8:11:19 AM Scope Withdrawal Time: 0 hours 11 minutes 59 seconds  Total Procedure Duration: 0 hours 13 minutes 36 seconds  Findings:                 The perianal and digital rectal examinations were                            normal.                           Multiple diverticula were found in the entire colon.                           The exam was otherwise without abnormality on                            direct and retroflexion views. Complications:            No immediate complications. Estimated blood loss:                            None. Estimated Blood Loss:     Estimated blood loss: none. Recommendation:           - Repeat colonoscopy in 10 years for screening                            purposes.                           - Resume previous diet.                           -  Continue present medications. Gatha Mayer, MD 02/21/2017 8:19:01 AM This report has been signed electronically.

## 2017-02-21 NOTE — Progress Notes (Signed)
Report to PACU, RN, vss, BBS= Clear.  

## 2017-02-21 NOTE — Patient Instructions (Addendum)
   No polyps or cancer seen. You do have diverticulosis - thickened muscle rings and pouches in the colon wall. Please read the handout about this condition.  Next routine colonoscopy or other screening test in 10 years - 2029  I appreciate the opportunity to care for you. Gregory Mayer, MD, [facility]  **Handout given on Diverticulosis**  YOU HAD AN ENDOSCOPIC PROCEDURE TODAY: Refer to the procedure report and other information in the discharge instructions given to you for any specific questions about what was found during the examination. If this information does not answer your questions, please call [place] office at [phone] to clarify.   YOU SHOULD EXPECT: Some feelings of bloating in the abdomen. Passage of more gas than usual. Walking can help get rid of the air that was put into your GI tract during the procedure and reduce the bloating. If you had a lower endoscopy (such as a colonoscopy or flexible sigmoidoscopy) you may notice spotting of blood in your stool or on the toilet paper. Some abdominal soreness may be present for a day or two, also.  DIET: Your first meal following the procedure should be a light meal and then it is ok to progress to your normal diet. A half-sandwich or bowl of soup is an example of a good first meal. Heavy or fried foods are harder to digest and may make you feel nauseous or bloated. Drink plenty of fluids but you should avoid alcoholic beverages for 24 hours. If you had a esophageal dilation, please see attached instructions for diet.    ACTIVITY: Your care partner should take you home directly after the procedure. You should plan to take it easy, moving slowly for the rest of the day. You can resume normal activity the day after the procedure however YOU SHOULD NOT DRIVE, use power tools, machinery or perform tasks that involve climbing or major physical exertion for 24 hours (because of the sedation medicines used during the test).   SYMPTOMS TO  REPORT IMMEDIATELY: A gastroenterologist can be reached at any hour. Please call [phone]  for any of the following symptoms:  Following lower endoscopy (colonoscopy, flexible sigmoidoscopy) Excessive amounts of blood in the stool  Significant tenderness, worsening of abdominal pains  Swelling of the abdomen that is new, acute  Fever of 100 or higher    FOLLOW UP:  If any biopsies were taken you will be contacted by phone or by letter within the next 1-3 weeks. Call [phone]  if you have not heard about the biopsies in 3 weeks.  Please also call with any specific questions about appointments or follow up tests.

## 2017-02-22 ENCOUNTER — Telehealth: Payer: Self-pay

## 2017-02-22 NOTE — Telephone Encounter (Signed)
  Follow up Call-  Call back number 02/21/2017  Post procedure Call Back phone  # [phone]  Permission to leave phone message Yes  Some recent data might be hidden     Patient questions:  Do you have a fever, pain , or abdominal swelling? No. Pain Score  0 *  Have you tolerated food without any problems? Yes.    Have you been able to return to your normal activities? Yes.    Do you have any questions about your discharge instructions: Diet   No. Medications  No. Follow up visit  No.  Do you have questions or concerns about your Care? No.  Actions: * If pain score is 4 or above: No action needed, pain <4.

## 2017-03-11 DIAGNOSIS — M25519 Pain in unspecified shoulder: Secondary | ICD-10-CM | POA: Insufficient documentation

## 2017-03-11 DIAGNOSIS — I119 Hypertensive heart disease without heart failure: Secondary | ICD-10-CM | POA: Insufficient documentation

## 2017-03-11 DIAGNOSIS — K5900 Constipation, unspecified: Secondary | ICD-10-CM | POA: Insufficient documentation

## 2017-03-11 DIAGNOSIS — K602 Anal fissure, unspecified: Secondary | ICD-10-CM | POA: Insufficient documentation

## 2017-03-11 DIAGNOSIS — R195 Other fecal abnormalities: Secondary | ICD-10-CM | POA: Insufficient documentation

## 2017-03-13 ENCOUNTER — Other Ambulatory Visit: Payer: Self-pay | Admitting: Internal Medicine

## 2017-03-13 DIAGNOSIS — Z136 Encounter for screening for cardiovascular disorders: Secondary | ICD-10-CM

## 2017-04-10 ENCOUNTER — Other Ambulatory Visit: Payer: Self-pay

## 2017-04-10 ENCOUNTER — Encounter: Payer: Self-pay | Admitting: Physician Assistant

## 2017-04-10 ENCOUNTER — Ambulatory Visit (INDEPENDENT_AMBULATORY_CARE_PROVIDER_SITE_OTHER): Payer: Self-pay | Admitting: Physician Assistant

## 2017-04-10 VITALS — BP 131/81 | HR 60 | Temp 97.8°F | Resp 18 | Ht 65.75 in | Wt 192.6 lb

## 2017-04-10 DIAGNOSIS — Z0289 Encounter for other administrative examinations: Secondary | ICD-10-CM

## 2017-04-10 NOTE — Progress Notes (Signed)
Commercial Driver Medical Examination   Gregory Marshall is a 67 y.o. male with a pertinent medical history of HTN who presents today for a commercial driver fitness determination physical exam. The patient reports no problems today. In the past the patient reports receiving 1 year certificates. He denies focal neurological deficits, vision and hearing changes. He denies the habitual use of benzodiazepines, opioids, amphetamines and denies illicit drug use. He denies a history of sleep apnea.    Current medications, family history, allergies, social history reviewed by me and exist elsewhere in the encounter.   Review of Systems  Constitutional: Negative for chills, diaphoresis and fever.  Eyes: Negative.   Respiratory: Negative for cough, hemoptysis, sputum production, shortness of breath and wheezing.   Cardiovascular: Negative for chest pain, orthopnea and leg swelling.  Gastrointestinal: Negative for abdominal pain, blood in stool, constipation, diarrhea, heartburn, melena, nausea and vomiting.  Genitourinary: Negative for dysuria, flank pain, frequency, hematuria and urgency.  Skin: Negative for rash.  Neurological: Negative for dizziness, sensory change, speech change, focal weakness and headaches.    Objective:     Vision/hearing:  Visual Acuity Screening   Right eye Left eye Both eyes  Without correction:     With correction: 20/25 20/25 20/20   Comments: Done passed 85 degrees left &amp; right Horizontal    Hearing Screening Comments: Done passed at 10 feet  Applicant can recognize and distinguish among traffic control signals and devices showing standard red, green, and amber colors.  Corrective lenses required: Yes  Monocular Vision?: No  Hearing aid requirement: No  Physical Exam  Constitutional: He appears well-developed. He is active and cooperative.  Non-toxic appearance.  HENT:  Right Ear: Hearing, tympanic membrane, external ear and ear canal normal.    Left Ear: Hearing, tympanic membrane, external ear and ear canal normal.  Nose: Nose normal. Right sinus exhibits no maxillary sinus tenderness and no frontal sinus tenderness. Left sinus exhibits no maxillary sinus tenderness and no frontal sinus tenderness.  Mouth/Throat: Uvula is midline, oropharynx is clear and moist and mucous membranes are normal. No oropharyngeal exudate, posterior oropharyngeal edema or tonsillar abscesses.  Eyes: Conjunctivae are normal. Pupils are equal, round, and reactive to light.  Cardiovascular: Normal rate, regular rhythm, S1 normal, S2 normal, normal heart sounds, intact distal pulses and normal pulses. Exam reveals no gallop and no friction rub.  No murmur heard. Pulmonary/Chest: Effort normal. No stridor. No tachypnea. No respiratory distress. He has no wheezes. He has no rales.  Abdominal: He exhibits no distension.  Musculoskeletal: He exhibits no edema.  Lymphadenopathy:       Head (right side): No submandibular and no tonsillar adenopathy present.       Head (left side): No submandibular and no tonsillar adenopathy present.    He has no cervical adenopathy.  Neurological: He is alert.  Skin: Skin is warm and dry. He is not diaphoretic. No pallor.  Vitals reviewed.   BP 131/81 (BP Location: Right Arm, Patient Position: Sitting, Cuff Size: Normal)   Pulse 60   Temp 97.8 F (36.6 C) (Oral)   Resp 18   Ht 5' 5.75" (1.67 m)   Wt 192 lb 9.6 oz (87.4 kg)   SpO2 98%   BMI 31.33 kg/m   Labs: Comments: Urinalysis- 1.020 Sp.Gr.  Neg Protein  Neg Blood Neg Sugar  Assessment:    Healthy male exam.  Meets standards, but periodic monitoring required due to HTN.  Driver qualified only for 1 year.  Plan:    Medical examiners certificate completed and printed. Return as needed.    Philis Fendt, MS, PA-C 9:47 AM, 04/10/2017

## 2017-04-25 ENCOUNTER — Ambulatory Visit: Payer: 59 | Admitting: Podiatry

## 2017-05-10 ENCOUNTER — Encounter: Payer: Self-pay | Admitting: Podiatry

## 2017-05-10 ENCOUNTER — Ambulatory Visit (INDEPENDENT_AMBULATORY_CARE_PROVIDER_SITE_OTHER): Payer: 59

## 2017-05-10 ENCOUNTER — Ambulatory Visit: Payer: 59 | Admitting: Podiatry

## 2017-05-10 DIAGNOSIS — M722 Plantar fascial fibromatosis: Secondary | ICD-10-CM

## 2017-05-10 MED ORDER — DICLOFENAC SODIUM 75 MG PO TBEC: 75.0000 mg | DELAYED_RELEASE_TABLET | Freq: Two times a day (BID) | ORAL | 2 refills | Status: DC

## 2017-05-10 MED ORDER — TRIAMCINOLONE ACETONIDE 10 MG/ML IJ SUSP
10.0000 mg | Freq: Once | INTRAMUSCULAR | Status: AC
  Administered 2017-05-10: 10 mg

## 2017-05-10 NOTE — Patient Instructions (Signed)

## 2017-05-10 NOTE — Progress Notes (Signed)
plantar ?

## 2017-05-12 NOTE — Progress Notes (Signed)
Subjective:   Patient ID: Gregory Marshall, male   DOB: 67 y.o.   MRN: [id number]   HPI Patient presents with exquisite discomfort plantar aspect left heel of several months duration which makes it hard to walk comfortably.  Patient does not smoke and likes to be active   Review of Systems  All other systems reviewed and are negative.       Objective:  Physical Exam  Constitutional: He appears well-developed and well-nourished.  Cardiovascular: Intact distal pulses.  Pulmonary/Chest: Effort normal.  Musculoskeletal: Normal range of motion.  Neurological: He is alert.  Skin: Skin is warm.  Nursing note and vitals reviewed.   Neurovascular status intact muscle strength adequate range of motion within normal limits with patient found to have exquisite discomfort plantar aspect left heel at the insertional point of the tendon and the calcaneus with inflammation fluid buildup noted.  Patient is found to have good digital perfusion well oriented x3     Assessment:  Acute plantar fasciitis left with inflammation fluid around the medial band     Plan:  H&P condition reviewed and today I went ahead and injected the left plantar fascia 3 mg Kenalog 5 mg Xylocaine and instructed on physical therapy for this.  Also applied fascial brace and reappoint to recheck  X-rays indicate that there is small spur but no indication stress fracture or arthritis

## 2017-05-23 ENCOUNTER — Ambulatory Visit: Payer: 59 | Admitting: Podiatry

## 2017-06-27 ENCOUNTER — Encounter: Payer: Self-pay | Admitting: Podiatry

## 2017-06-27 ENCOUNTER — Ambulatory Visit: Payer: 59 | Admitting: Podiatry

## 2017-06-27 DIAGNOSIS — M722 Plantar fascial fibromatosis: Secondary | ICD-10-CM | POA: Diagnosis not present

## 2017-06-27 MED ORDER — TRIAMCINOLONE ACETONIDE 10 MG/ML IJ SUSP
10.0000 mg | Freq: Once | INTRAMUSCULAR | Status: AC
  Administered 2017-06-27: 10 mg

## 2017-06-28 ENCOUNTER — Ambulatory Visit: Payer: 59 | Admitting: Podiatry

## 2017-06-28 NOTE — Progress Notes (Signed)
Subjective:   Patient ID: Gregory Marshall, male   DOB: 67 y.o.   MRN: [id number]   HPI Patient presents stating that her foot has started to hurt him quite a bit again and the brace only helped temporarily.  States pain is worse in the morning and after periods of sitting   ROS      Objective:  Physical Exam  Neurovascular status intact with patient noted to have exquisite discomfort plantar aspect left heel at the insertional point of the tendon into the calcaneus with fluid buildup around the medial band     Assessment:  Acute plantar fasciitis left with inflammation fluid around the medial band     Plan:  Reviewed condition and at this time advised on the importance of supportive shoes and physical therapy.  I did reinject the plantar fascia 3 mg Kenalog 5 mg Xylocaine and dispensed night splint to use given instructions on aggressive ice with night splint and I want him sleeping and for the next 3 weeks.  Reappoint and discussed possibility of long-term orthotics if symptoms persist

## 2017-07-18 ENCOUNTER — Ambulatory Visit: Payer: 59 | Admitting: Podiatry

## 2017-08-12 ENCOUNTER — Other Ambulatory Visit: Payer: Self-pay

## 2017-08-12 ENCOUNTER — Ambulatory Visit: Payer: 59 | Admitting: Family Medicine

## 2017-08-12 ENCOUNTER — Ambulatory Visit (INDEPENDENT_AMBULATORY_CARE_PROVIDER_SITE_OTHER): Payer: 59

## 2017-08-12 DIAGNOSIS — M25561 Pain in right knee: Secondary | ICD-10-CM

## 2017-08-12 DIAGNOSIS — M25511 Pain in right shoulder: Secondary | ICD-10-CM | POA: Diagnosis not present

## 2017-08-12 MED ORDER — MELOXICAM 7.5 MG PO TABS: 7.5000 mg | ORAL_TABLET | Freq: Two times a day (BID) | ORAL | 0 refills | Status: DC

## 2017-08-12 MED ORDER — TIZANIDINE HCL 4 MG PO TABS
4.0000 mg | ORAL_TABLET | Freq: Four times a day (QID) | ORAL | 0 refills | Status: DC | PRN
Start: 2017-08-12 — End: 2018-07-01
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
## 2017-12-23 ENCOUNTER — Ambulatory Visit
Admission: RE | Admit: 2017-12-23 | Discharge: 2017-12-23 | Disposition: A | Discharge status: Home / Self Care | Payer: 59 | Source: Ambulatory Visit | Attending: Internal Medicine | Admitting: Internal Medicine

## 2017-12-23 DIAGNOSIS — Z136 Encounter for screening for cardiovascular disorders: Secondary | ICD-10-CM

## 2018-02-05 ENCOUNTER — Other Ambulatory Visit (HOSPITAL_COMMUNITY): Payer: Self-pay | Admitting: Urology

## 2018-02-05 ENCOUNTER — Other Ambulatory Visit: Payer: Self-pay | Admitting: Urology

## 2018-02-05 DIAGNOSIS — C61 Malignant neoplasm of prostate: Secondary | ICD-10-CM

## 2018-02-17 ENCOUNTER — Ambulatory Visit (HOSPITAL_COMMUNITY)
Admission: RE | Admit: 2018-02-17 | Discharge: 2018-02-17 | Disposition: A | Discharge status: Home / Self Care | Payer: Managed Care, Other (non HMO) | Source: Ambulatory Visit | Attending: Urology | Admitting: Urology

## 2018-02-17 ENCOUNTER — Encounter (HOSPITAL_COMMUNITY)
Admission: RE | Admit: 2018-02-17 | Discharge: 2018-02-17 | Disposition: A | Discharge status: Home / Self Care | Payer: Managed Care, Other (non HMO) | Source: Ambulatory Visit | Attending: Urology | Admitting: Urology

## 2018-02-17 DIAGNOSIS — C61 Malignant neoplasm of prostate: Secondary | ICD-10-CM | POA: Diagnosis not present

## 2018-02-17 MED ORDER — TECHNETIUM TC 99M MEDRONATE IV KIT
21.9000 | PACK | Freq: Once | INTRAVENOUS | Status: AC | PRN
  Administered 2018-02-17: 21.9 via INTRAVENOUS

## 2018-02-27 NOTE — Progress Notes (Signed)
GU Location of Tumor / Histology: prostatic adenocarcinoma  If Prostate Cancer, Gleason Score is (4 +4) and PSA is (7.6). Prostate volume: 34 cc.  Gregory Marshall was referred by Dr. Latanya Presser, MD [facility]) to Dr. Karsten Ro for further evaluation of an elevated PSA in December 2019. Patient reports PSA was normal in 2018. Patient confirms Dr. Maia Petties is his PCP.   Biopsies of prostate 01/28/2018     Past/Anticipated interventions by urology, if any: prostate biopsy (01/28/2018), bone scan (negative), CT scan abdomen/pelvis (negative), Lupron 45 (given 02/18/2018), referral for consideration of radiation therapy  Past/Anticipated interventions by medical oncology, if any: none  Weight changes, if any: no  Bowel/Bladder complaints, if any: Denies hematuria. Denies dysuria. Denies urinary leakage or incontinence.   Nausea/Vomiting, if any: no  Pain issues, if any:  Denies any new pain. 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  SAFETY ISSUES:  Prior radiation? no  Pacemaker/ICD? no  Possible current pregnancy? no, male patient.  Is the patient on methotrexate? no  Current Complaints / other details: 68 year old male. Married with 1 son and 1 daughter. NKDA. No family hx of prostate ca. A simple cyst of the left kidney was identified on CT scan in 09/2012. Reports a history of kidney stones. Reports a maternal uncle and two paternal cousin with prostate ca.

## 2018-02-28 DIAGNOSIS — C61 Malignant neoplasm of prostate: Secondary | ICD-10-CM | POA: Insufficient documentation

## 2018-03-01 DIAGNOSIS — C61 Malignant neoplasm of prostate: Secondary | ICD-10-CM | POA: Insufficient documentation

## 2018-03-03 ENCOUNTER — Encounter: Payer: Self-pay | Admitting: Radiation Oncology

## 2018-03-04 ENCOUNTER — Encounter: Payer: Self-pay | Admitting: Radiation Oncology

## 2018-03-04 ENCOUNTER — Encounter: Payer: Self-pay | Admitting: Medical Oncology

## 2018-03-04 ENCOUNTER — Ambulatory Visit
Admission: RE | Admit: 2018-03-04 | Discharge: 2018-03-04 | Disposition: A | Discharge status: Home / Self Care | Payer: Managed Care, Other (non HMO) | Source: Ambulatory Visit | Attending: Radiation Oncology | Admitting: Radiation Oncology

## 2018-03-04 ENCOUNTER — Other Ambulatory Visit: Payer: Self-pay

## 2018-03-04 DIAGNOSIS — Z79899 Other long term (current) drug therapy: Secondary | ICD-10-CM | POA: Diagnosis not present

## 2018-03-04 DIAGNOSIS — I10 Essential (primary) hypertension: Secondary | ICD-10-CM | POA: Diagnosis not present

## 2018-03-04 DIAGNOSIS — C61 Malignant neoplasm of prostate: Secondary | ICD-10-CM

## 2018-03-04 DIAGNOSIS — Z87891 Personal history of nicotine dependence: Secondary | ICD-10-CM | POA: Insufficient documentation

## 2018-03-04 HISTORY — DX: Malignant neoplasm of prostate: C61

## 2018-03-04 NOTE — Progress Notes (Signed)
Radiation Oncology         [phone] ________________________________  Initial Outpatient Consultation  Name: Gregory Marshall MRN: [id number]  Date: 03/04/2018  DOB: 01-05-1951  WG:[id number], Larey Dresser, MD  Kathie Rhodes, MD   REFERRING PHYSICIAN: Kathie Rhodes, MD  DIAGNOSIS: 68 y.o. 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    ICD-10-CM   1. Malignant neoplasm of prostate ([place]) C61     HISTORY OF PRESENT ILLNESS: Gregory Marshall is a 68 y.o. male with a diagnosis of prostate cancer. He was noted to have an elevated PSA of 7.6 in November 2019 by his primary care physician, Dr. Latanya Presser.  Accordingly, he was referred for evaluation in urology to Dr. Karsten Ro on 01/02/2018, and a digital rectal examination was performed at that time revealing right lobe firmness, but no prostate nodules.  The patient proceeded to transrectal ultrasound with 12 biopsies of the prostate on 01/28/2018.  The prostate volume measured 33.76 cc.  Out of 12 core biopsies, 4 were positive.  The maximum Gleason score was 4+4, and this was seen in the left base lateral and left base. Additionally, there was Gleason 4+3 seen in the left mid lateral, and 3+3 in the right apex lateral.  Staging studies include CT Abdomen/Pelvis and bone scans performed on 02/17/2018 which were both negative for metastatic disease.   He has been started on androgen deprivation with Lupron 45 mg, given on 02/18/2018.  The patient reviewed the biopsy results with his urologist and he has kindly been referred today for discussion of potential radiation treatment options.   PREVIOUS RADIATION THERAPY: No  PAST MEDICAL HISTORY:  Past Medical History:  Diagnosis Date  . Arthritis    shoulders  . Hyperlipidemia   . Hypertension   . Kidney stones    passed stone, no surgery required  . Prostate cancer ([place])   . Vertigo    hx of, x 1 - resolved      PAST SURGICAL  HISTORY: Past Surgical History:  Procedure Laterality Date  . CERVICAL FUSION  1998  . COLONOSCOPY  2008  . LUMBAR LAMINECTOMY/DECOMPRESSION MICRODISCECTOMY Left 06/21/2012   Procedure: LUMBAR LAMINECTOMY/DECOMPRESSION MICRODISCECTOMY 1 LEVEL;  Surgeon: Hosie Spangle, MD;  Location: [facility];  Service: Neurosurgery;  Laterality: Left;  LEFT L3-4 extraforaminal microdiskectomy  . MULTIPLE TOOTH EXTRACTIONS     full dentures    FAMILY HISTORY:  Family History  Problem Relation Age of Onset  . Alzheimer's disease Mother   . Heart disease Brother 38       CABG  . Prostate cancer Maternal Uncle   . Prostate cancer Cousin        paternal  . Prostate cancer Cousin        paternal  . Colon cancer Neg Hx   . Colon polyps Neg Hx   . Rectal cancer Neg Hx   . Stomach cancer Neg Hx     SOCIAL HISTORY:  Social History   Socioeconomic History  . Marital status: Married    Spouse name: Gabriel Cirri  . Number of children: 3  . Years of education: 82  . Highest education level: Not on file  Occupational History  . 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 Financial resource strain: Not on file  . Food insecurity:    Worry: Not on file  Inability: Not on file  . Transportation needs:    Medical: Not on file    Non-medical: Not on file  Tobacco Use  . Smoking status: Former Research scientist (life sciences)  . Smokeless tobacco: Never Used  Substance and Sexual Activity  . Alcohol use: Yes    Alcohol/week: 0.0 standard drinks    Comment: beer - every now and then  . Drug use: No  . Sexual activity: Yes  Lifestyle  . Physical activity:    Days per week: Not on file    Minutes per session: Not on file  . Stress: Not on file  Relationships  . Social connections:    Talks on phone: Not on file    Gets together: Not on file    Attends religious service: Not on file    Active member of club or organization: Not on file    Attends  meetings of clubs or organizations: Not on file    Relationship status: Not on file  . Intimate partner violence:    Fear of current or ex partner: Not on file    Emotionally abused: Not on file    Physically abused: Not on file    Forced sexual activity: Not on file  Other Topics Concern  . Not on file  Social History Narrative   Lives with his wife.  Adult children live out of state. Resides in [place].     ALLERGIES: Patient has no known allergies.  MEDICATIONS:  Current Outpatient Medications  Medication Sig Dispense Refill  . diclofenac (VOLTAREN) 75 MG EC tablet Take 1 tablet (75 mg total) by mouth 2 (two) times daily. 50 tablet 2  . ergocalciferol (VITAMIN D2) 1.25 MG (50000 UT) capsule Vitamin D2 1,250 mcg (50,000 unit) capsule  Take 1 capsule every week by oral route.    . gabapentin (NEURONTIN) 300 MG capsule Take 300 mg by mouth at bedtime.  3  . sildenafil (VIAGRA) 100 MG tablet Take 0.5-1 tablets (50-100 mg total) by mouth daily as needed for erectile dysfunction. Stop taking if you are ever prescribed Nitroglycerin 5 tablet 11  . simvastatin (ZOCOR) 40 MG tablet Take 1 tablet (40 mg total) by mouth every evening. 90 tablet 3  . docusate sodium (COLACE) 100 MG capsule Colace 100 mg capsule  Take 1 capsule every day by oral route at bedtime for 30 days.    . hydrochlorothiazide (HYDRODIURIL) 25 MG tablet Take 25 mg by mouth daily.  3  . meloxicam (MOBIC) 7.5 MG tablet Take 1 tablet (7.5 mg total) by mouth 2 (two) times daily. (Patient not taking: Reported on 03/04/2018) 30 tablet 0  . rosuvastatin (CRESTOR) 10 MG tablet Crestor 10 mg tablet  Take 1 tablet every day by oral route in the evening for 30 days.    Marland Kitchen tiZANidine (ZANAFLEX) 4 MG tablet Take 1 tablet (4 mg total) by mouth every 6 (six) hours as needed for muscle spasms. (Patient not taking: Reported on 03/04/2018) 30 tablet 0   Current Facility-Administered Medications  Medication Dose Route Frequency Provider Last  Rate Last Dose  . 0.9 %  sodium chloride infusion  500 mL Intravenous Once Gatha Mayer, MD        REVIEW OF SYSTEMS:  On review of systems, the patient reports that he is doing well overall. He denies any chest pain, shortness of breath, cough, fevers, chills, night sweats, or unintended weight changes. He denies any bowel disturbances, and denies abdominal pain, nausea or vomiting. He denies  any new musculoskeletal or joint aches or pains, but has chronic low back pain related to previous back surgeries. His IPSS was 1, indicating mild urinary symptoms. He denies hematuria, dysuria, leakage or incontinence. His SHIM was 23, indicating he does not have erectile dysfunction. A complete review of systems is obtained and is otherwise negative.    PHYSICAL EXAM:  Wt Readings from Last 3 Encounters:  03/04/18 198 lb 2 oz (89.9 kg)  08/12/17 186 lb (84.4 kg)  04/10/17 192 lb 9.6 oz (87.4 kg)   Temp Readings from Last 3 Encounters:  03/04/18 98.2 F (36.8 C) (Oral)  08/12/17 98.3 F (36.8 C) (Oral)  04/10/17 97.8 F (36.6 C) (Oral)   BP Readings from Last 3 Encounters:  03/04/18 (!) 155/89  08/12/17 116/66  04/10/17 131/81   Pulse Readings from Last 3 Encounters:  03/04/18 (!) 58  08/12/17 60  04/10/17 60   Pain Assessment Pain Score: 0-No pain/10  In general this is a well appearing African-American male in no acute distress. He is alert and oriented x4 and appropriate throughout the examination. HEENT reveals that the patient is normocephalic, atraumatic. EOMs are intact. PERRLA. Skin is intact without any evidence of gross lesions. Cardiovascular exam reveals a regular rate and rhythm, no clicks rubs or murmurs are auscultated. Chest is clear to auscultation bilaterally. Lymphatic assessment is performed and does not reveal any adenopathy in the cervical, supraclavicular, axillary, or inguinal chains. Abdomen has active bowel sounds in all quadrants and is intact. The abdomen is  soft, non tender, non distended. Lower extremities are negative for pretibial pitting edema, deep calf tenderness, cyanosis or clubbing.   KPS = 100  100 - Normal; no complaints; no evidence of disease. 90   - Able to carry on normal activity; minor signs or symptoms of disease. 80   - Normal activity with effort; some signs or symptoms of disease. 45   - Cares for self; unable to carry on normal activity or to do active work. 60   - Requires occasional assistance, but is able to care for most of his personal needs. 50   - Requires considerable assistance and frequent medical care. 98   - Disabled; requires special care and assistance. 18   - Severely disabled; hospital admission is indicated although death not imminent. 65   - Very sick; hospital admission necessary; active supportive treatment necessary. 10   - Moribund; fatal processes progressing rapidly. 0     - Dead  Karnofsky DA, Abelmann Alva, Craver LS and Burchenal [facility] [phone]) The use of the nitrogen mustards in the palliative treatment of carcinoma: with particular reference to bronchogenic carcinoma Cancer 1 [id number]  LABORATORY DATA:  Lab Results  Component Value Date   WBC 5.3 03/13/2014   HGB 17.0 03/13/2014   HCT 47.7 03/13/2014   MCV 83.0 03/13/2014   PLT 236 03/13/2014   Lab Results  Component Value Date   NA 135 03/13/2014   K 4.4 03/13/2014   CL 100 03/13/2014   CO2 29 03/13/2014   Lab Results  Component Value Date   ALT 14 10/03/2012   AST 19 10/03/2012   ALKPHOS 48 10/03/2012   BILITOT 0.5 10/03/2012     RADIOGRAPHY: Nm Bone Scan Whole Body  Result Date: 02/17/2018 CLINICAL DATA:  Prostate cancer; PSA 7.6 on 12/10/2017 EXAM: NUCLEAR MEDICINE WHOLE BODY BONE SCAN TECHNIQUE: Whole body anterior and posterior images were obtained approximately 3 hours after intravenous injection of radiopharmaceutical. RADIOPHARMACEUTICALS:  21.9  mCi Technetium-68mMDP IV COMPARISON:  None Correlation: CT abdomen and pelvis  02/17/2018 FINDINGS: Uptake at the shoulders, sternoclavicular joints, hips, knees, RIGHT elbow, RIGHT wrist, and RIGHT ankle, typically degenerative. Uptake in the cervical spine, at the LEFT lateral aspect of the lower lumbar spine at L4-L5 and at multiple RIGHT costovertebral junctions, typically degenerative. Subtle increased tracer accumulation in the lower thoracic spine at approximately T8-T9, corresponding to degenerative disc disease changes on CT. No definite sites of abnormal osseous tracer accumulation are identified which are suspicious for metastatic prostate cancer. Expected urinary tract and soft tissue distribution of tracer. IMPRESSION: No definite scintigraphic evidence of osseous metastatic disease. Electronically Signed   By: MLavonia DanaM.D.   On: 02/17/2018 15:01      IMPRESSION/PLAN: 1. 68y.o. gentleman with Stage T2b adenocarcinoma of the prostate with Gleason Score of 4+4, and PSA of 7.6. We discussed the patient's workup and outlined the nature of prostate cancer in this setting. The patient's T stage, Gleason's score, and PSA put him into the high risk group. Accordingly, he is eligible for a variety of potential treatment options including LT-ADT in combination with either 8 weeks of external radiation or brachytherapy boost followed by 5 weeks of external radiation. We discussed the available radiation techniques, and focused on the details and logistics and delivery.  We discussed and outlined the risks, benefits, short and long-term effects associated with radiotherapy and compared and contrasted these with prostatectomy. We discussed the role of SpaceOAR in reducing the rectal toxicity associated with radiotherapy. We also detailed the role of ADT in the treatment of high risk prostate cancer and outlined the associated side effects that could be expected with this therapy.  At the end of the conversation the patient is interested in moving forward with brachytherapy boost  followed by a 5 week course of daily radiotherapy and use of SpaceOAR to reduce rectal toxicity from radiotherapy.  This will be used in combination with LT-ADT. He was given a 6 month Lupron injection on 02/18/18 and would therefore be eligible to begin radiotherapy around 04/15/18. The patient met briefly with SRomie Jumperin our office who will be working closely with him to coordinate OR scheduling and pre and post procedure appointments.  We will contact the pharmaceutical rep to ensure that [place] time of procedure.  He will have a prostate MRI following his post-seed CT SIM to confirm appropriate distribution of the [place] We will share this information with Dr. OKarsten Roand proceed accordingly.  We spent 60 minutes face to face with the patient and more than 50% of that time was spent in counseling and/or coordination of care.    ANicholos Johns PA-C    MTyler Pita MD  [facility] Direct Dial: [phone] Fax: [phone]  Skype  LinkedIn  This document serves as a record of services personally performed by MTyler Pita MD and AFreeman Caldron PA-C. It was created on their behalf by HRae Lips a trained medical scribe. The creation of this record is based on the scribe's personal observations and the providers' statements to them. This document has been checked and approved by the attending providers.

## 2018-03-04 NOTE — Progress Notes (Signed)
Introduced myself to Gregory Marshall as the prostate nurse navigator and my role.  He is not aware of any family history of prostate cancer. He is not interested in surgery but here to learn about his radiation options. He did receive Lupron 02/18/18. After discussion with Dr. Tammi Klippel he would like to move forward with 5 1/2 weeks of radiation with seed boost. I gave him my business card and asked him to call me with questions or concerns. He voiced understanding.

## 2018-03-04 NOTE — Progress Notes (Signed)
See progress note under physician encounter. 

## 2018-03-07 ENCOUNTER — Telehealth: Payer: Self-pay | Admitting: *Deleted

## 2018-03-07 ENCOUNTER — Telehealth: Payer: Self-pay | Admitting: Medical Oncology

## 2018-03-07 NOTE — Telephone Encounter (Signed)
CALLED PATIENT TO INFORM OF PRE-SEED APPTS. FOR 03-20-18, LVM FOR A RETURN CALL

## 2018-03-07 NOTE — Telephone Encounter (Signed)
Patient called asking if he will have to pay co-pays for his radiation treatments. I explained that our office will contact his insurance company for approval to treat. He may have a percentage left to pay after insurance but that depends on his coverage. I explained that he can set up payments if he is worried about having to pay a large sum. He voiced understanding. He also asked if I would send Dr. Johny Shears consult note to his primary care MD. I will be happy to send. I asked him to call me back anytime with questions or concerns.

## 2018-03-14 ENCOUNTER — Other Ambulatory Visit: Payer: Self-pay | Admitting: Urology

## 2018-03-14 ENCOUNTER — Telehealth: Payer: Self-pay | Admitting: *Deleted

## 2018-03-14 NOTE — Telephone Encounter (Signed)
Returned patient's phone call, spoke with patient 

## 2018-03-19 ENCOUNTER — Telehealth: Payer: Self-pay | Admitting: *Deleted

## 2018-03-19 NOTE — Telephone Encounter (Signed)
CALLED PATIENT TO REMIND OF PRE-SEED PLANNING CT FOR 03/20/18, SPOKE WITH PATIENT AND HE IS AWARE OF THIS APPT.

## 2018-03-20 ENCOUNTER — Ambulatory Visit
Admission: RE | Admit: 2018-03-20 | Discharge: 2018-03-20 | Disposition: A | Discharge status: Home / Self Care | Payer: Managed Care, Other (non HMO) | Source: Ambulatory Visit | Attending: Radiation Oncology | Admitting: Radiation Oncology

## 2018-03-20 ENCOUNTER — Encounter: Payer: Self-pay | Admitting: Medical Oncology

## 2018-03-20 ENCOUNTER — Other Ambulatory Visit: Payer: Self-pay | Admitting: Urology

## 2018-03-20 ENCOUNTER — Ambulatory Visit
Admission: RE | Admit: 2018-03-20 | Discharge: 2018-03-20 | Disposition: A | Discharge status: Home / Self Care | Payer: Managed Care, Other (non HMO) | Source: Ambulatory Visit | Attending: Urology | Admitting: Urology

## 2018-03-20 DIAGNOSIS — C61 Malignant neoplasm of prostate: Secondary | ICD-10-CM | POA: Insufficient documentation

## 2018-03-20 DIAGNOSIS — Z510 Encounter for antineoplastic radiation therapy: Secondary | ICD-10-CM | POA: Diagnosis not present

## 2018-03-21 ENCOUNTER — Ambulatory Visit (HOSPITAL_COMMUNITY)
Admission: RE | Admit: 2018-03-21 | Discharge: 2018-03-21 | Disposition: A | Discharge status: Home / Self Care | Payer: Managed Care, Other (non HMO) | Source: Ambulatory Visit | Attending: Urology | Admitting: Urology

## 2018-03-21 ENCOUNTER — Encounter (HOSPITAL_COMMUNITY)
Admission: RE | Admit: 2018-03-21 | Discharge: 2018-03-21 | Disposition: A | Discharge status: Home / Self Care | Payer: Managed Care, Other (non HMO) | Source: Ambulatory Visit | Attending: Urology | Admitting: Urology

## 2018-03-21 ENCOUNTER — Other Ambulatory Visit: Payer: Self-pay

## 2018-03-21 DIAGNOSIS — C61 Malignant neoplasm of prostate: Secondary | ICD-10-CM | POA: Diagnosis present

## 2018-03-21 NOTE — Progress Notes (Signed)
  Radiation Oncology         [phone] ________________________________  Name: Gregory Marshall MRN: [id number]  Date: 03/20/2018  DOB: 1950-11-06  SIMULATION AND TREATMENT PLANNING NOTE PUBIC ARCH STUDY  QQ:IWLNLG, Larey Dresser, MD  Kathie Rhodes, MD  DIAGNOSIS: 68 y.o. 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     ICD-10-CM   1. Malignant neoplasm of prostate ([place]) C61     COMPLEX SIMULATION:  The patient presented today for evaluation for possible prostate seed implant. He was brought to the radiation planning suite and placed supine on the CT couch. A 3-dimensional image study set was obtained in upload to the planning computer. There, on each axial slice, I contoured the prostate gland. Then, using three-dimensional radiation planning tools I reconstructed the prostate in view of the structures from the transperineal needle pathway to assess for possible pubic arch interference. In doing so, I did not appreciate any pubic arch interference. Also, the patient's prostate volume was estimated based on the drawn structure. The volume was 33 cc.  Given the pubic arch appearance and prostate volume, patient remains a good candidate to proceed with prostate seed implant. Today, he freely provided informed written consent to proceed.    PLAN: The patient will undergo prostate seed implant boost to 110 Gy followed by IMRT to 45 Gy   ________________________________  Sheral Apley. Tammi Klippel, M.D.   This document serves as a record of services personally performed by Tyler Pita, MD. It was created on his behalf by Wilburn Mylar, a trained medical scribe. The creation of this record is based on the scribe's personal observations and the provider's statements to them. This document has been checked and approved by the attending provider.

## 2018-04-04 NOTE — Progress Notes (Signed)
Patient referred by Audley Hose, MD for hypertension  Subjective:   Gregory Marshall, male    DOB: [birth year], 68 y.o.   MRN: [id number]   Chief Complaint  Patient presents with  . Abnormal ECG    HPI  68 y.o. African American male with hypertension, h/o prostate cancer, referred for evaluation of abnormal EKG.  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  He has had abnormal EKG in the past for which she underwent stress echocardiogram in 2015.  This was reportedly normal.     Past Medical History:  Diagnosis Date  . Arthritis    shoulders  . Hyperlipidemia   . Hypertension   . Kidney stones    passed stone, no surgery required  . Prostate cancer ([place])   . Vertigo    hx of, x 1 - resolved     Past Surgical History:  Procedure Laterality Date  . CERVICAL FUSION  1998  . COLONOSCOPY  2008  . LUMBAR LAMINECTOMY/DECOMPRESSION MICRODISCECTOMY Left 06/21/2012   Procedure: LUMBAR LAMINECTOMY/DECOMPRESSION MICRODISCECTOMY 1 LEVEL;  Surgeon: Hosie Spangle, MD;  Location: [facility];  Service: Neurosurgery;  Laterality: Left;  LEFT L3-4 extraforaminal microdiskectomy  . MULTIPLE TOOTH EXTRACTIONS     full dentures     Social History   Socioeconomic History  . Marital status: Married    Spouse name: Gabriel Cirri  . Number of children: 3  . Years of education: 43  . Highest education level: Not on file  Occupational History  . 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 Financial resource strain: Not on file  . Food insecurity:    Worry: Not on file    Inability: Not on file  . Transportation needs:    Medical: Not on file    Non-medical: Not on file  Tobacco Use  . Smoking status: Never Smoker  . Smokeless tobacco: Never Used  Substance and Sexual Activity  . Alcohol use: Not Currently   Alcohol/week: 0.0 standard drinks  . Drug use: No  . Sexual activity: Yes  Lifestyle  . Physical activity:    Days per week: Not on file    Minutes per session: Not on file  . Stress: Not on file  Relationships  . Social connections:    Talks on phone: Not on file    Gets together: Not on file    Attends religious service: Not on file    Active member of club or organization: Not on file    Attends meetings of clubs or organizations: Not on file    Relationship status: Not on file  . Intimate partner violence:    Fear of current or ex partner: Not on file    Emotionally abused: Not on file    Physically abused: Not on file    Forced sexual activity: Not on file  Other Topics Concern  . Not on file  Social History Narrative   Lives with his wife.  Adult children live out of state. Resides in [place].      Current Outpatient Medications on File Prior to Visit  Medication Sig Dispense Refill  . diclofenac (VOLTAREN) 75 MG EC tablet Take 1 tablet (75 mg total) by mouth 2 (two) times daily. 50 tablet 2  . docusate sodium (COLACE) 100 MG capsule  Colace 100 mg capsule  Take 1 capsule every day by oral route at bedtime for 30 days.    . ergocalciferol (VITAMIN D2) 1.25 MG (50000 UT) capsule Vitamin D2 1,250 mcg (50,000 unit) capsule  Take 1 capsule every week by oral route.    . meloxicam (MOBIC) 7.5 MG tablet Take 1 tablet (7.5 mg total) by mouth 2 (two) times daily. 30 tablet 0  . rosuvastatin (CRESTOR) 10 MG tablet Crestor 10 mg tablet  Take 1 tablet every day by oral route in the evening for 30 days.    . sildenafil (VIAGRA) 100 MG tablet Take 0.5-1 tablets (50-100 mg total) by mouth daily as needed for erectile dysfunction. Stop taking if you are ever prescribed Nitroglycerin 5 tablet 11  . gabapentin (NEURONTIN) 300 MG capsule Take 300 mg by mouth at bedtime.  3  . hydrochlorothiazide (HYDRODIURIL) 25 MG tablet Take 25 mg by mouth daily.  3  . simvastatin (ZOCOR) 40 MG  tablet Take 1 tablet (40 mg total) by mouth every evening. (Patient not taking: Reported on 04/07/2018) 90 tablet 3  . tiZANidine (ZANAFLEX) 4 MG tablet Take 1 tablet (4 mg total) by mouth every 6 (six) hours as needed for muscle spasms. (Patient not taking: Reported on 03/04/2018) 30 tablet 0   Current Facility-Administered Medications on File Prior to Visit  Medication Dose Route Frequency Provider Last Rate Last Dose  . 0.9 %  sodium chloride infusion  500 mL Intravenous Once Gatha Mayer, MD        Cardiovascular studies:  EKG 04/07/2018: Sinus rhythm 60 bpm. Incomplete RBBB Anterolateral T wave inversion. Consider ischemia.  Unchanged compared to prior EKG  EKG 03/21/18: Sinus bradycardia. Left atrial enlargement. ST & Marked T wave abnormality, consider anterolateral ischemia as well as inferior leads No significant change since last tracing  Echocardiogram stress test without contrast 02/06/2013: The stress ECG was borderline positive in theanterolateral leads. However, the echo images were normalat rest and with stress so no evidence for ischemia. Goodexercise tolerance with no chest pain. I suspect that thisis a normal study.   Recent labs:  12/19/2017: Glucose 87. BUN/Cr 10/1.0. eGFR 89. Na/K 140/4.7. H/H 15/47. MCV 87. Platelets 257. Chol 234, TG 95, HDL 48, LDL 167    Review of Systems  Constitution: Negative for decreased appetite, malaise/fatigue, weight gain and weight loss.  HENT: Negative for congestion.   Eyes: Negative for visual disturbance.  Cardiovascular: Negative for chest pain, claudication, dyspnea on exertion, leg swelling, palpitations and syncope.  Respiratory: Negative for shortness of breath.   Endocrine: Negative for cold intolerance.  Hematologic/Lymphatic: Does not bruise/bleed easily.  Skin: Negative for itching and rash.  Musculoskeletal: Negative for myalgias.  Gastrointestinal: Negative for abdominal pain, nausea and vomiting.    Genitourinary: Negative for dysuria.  Neurological: Negative for dizziness and weakness.  Psychiatric/Behavioral: The patient is not nervous/anxious.   All other systems reviewed and are negative.        Vitals:   04/07/18 1352  BP: (!) 141/82  Pulse: 65  SpO2: 100%    Objective:   Physical Exam  Constitutional: He is oriented to person, place, and time. He appears well-developed and well-nourished. No distress.  HENT:  Head: Normocephalic and atraumatic.  Eyes: Pupils are equal, round, and reactive to light. Conjunctivae are normal.  Neck: Neck supple. No JVD present. No thyromegaly present.  Cardiovascular: Normal rate, regular rhythm, normal heart sounds and intact distal pulses. Exam reveals no gallop.  No  murmur heard. Pulmonary/Chest: Effort normal and breath sounds normal. He has no wheezes. He has no rales.  Abdominal: Soft. Bowel sounds are normal. There is no rebound.  Musculoskeletal: Normal range of motion.        General: No edema.  Lymphadenopathy:    He has no cervical adenopathy.  Neurological: He is alert and oriented to person, place, and time. No cranial nerve deficit.  Skin: Skin is warm and dry.  Psychiatric: He has a normal mood and affect.  Nursing note and vitals reviewed.         Assessment & Recommendations:   68 y.o. African American male with hypertension, h/o prostate cancer, referred for evaluation of abnormal EKG.  1. Abnormal ECG Chart review suggests that he has had abnormal EKG with anterolateral T wave inversions for many years.  This predates diagnosis and initiation of management for hypertension.  Normal stress echocardiogram in 2015 with no angina symptoms with excellent physical capacity.  I suspect this could be because of LV strain.  This is a stable finding for the patient without any significant clinical consequences.  Will obtain echocardiogram to rule out any structural change since last study in 2015.  2. Essential  hypertension Relatively well-controlled.  Continue current antihypertensive therapy.  3. Mixed hyperlipidemia Currently on Crestor 10 mg daily.  I suggested increasing Crestor dose.  Patient would like to discuss this with his PCP.  I will see him on as-needed basis, unless significant abnormalities found on echocardiogram.   Thank you for referring the patient to [place]. Please feel free to contact with any questions.  Nigel Mormon, MD [facility] Pager: [phone] Office: [phone] If no answer Cell [phone]

## 2018-04-07 ENCOUNTER — Encounter: Payer: Self-pay | Admitting: Cardiology

## 2018-04-07 ENCOUNTER — Ambulatory Visit: Payer: Managed Care, Other (non HMO) | Admitting: Cardiology

## 2018-04-07 VITALS — BP 141/82 | HR 65 | Ht 65.0 in | Wt 198.2 lb

## 2018-04-07 DIAGNOSIS — R9431 Abnormal electrocardiogram [ECG] [EKG]: Secondary | ICD-10-CM

## 2018-04-07 DIAGNOSIS — E782 Mixed hyperlipidemia: Secondary | ICD-10-CM

## 2018-04-07 DIAGNOSIS — I10 Essential (primary) hypertension: Secondary | ICD-10-CM

## 2018-04-08 ENCOUNTER — Other Ambulatory Visit: Payer: Self-pay

## 2018-04-09 ENCOUNTER — Telehealth: Payer: Self-pay | Admitting: Radiation Oncology

## 2018-04-09 NOTE — Telephone Encounter (Signed)
Faxed completed ONCOLOGY TREATMENT PLAN REQUEST FORM back to Christianne Dolin, Therapist, sports, BSN at [facility]. Fax confirmation of delivery obtained.

## 2018-04-15 ENCOUNTER — Telehealth: Payer: Self-pay | Admitting: *Deleted

## 2018-04-15 NOTE — Telephone Encounter (Signed)
RETURNED PATIENT'S PHONE CALL, SPOKE WITH PATIENT. ?

## 2018-04-17 ENCOUNTER — Ambulatory Visit: Payer: Managed Care, Other (non HMO)

## 2018-04-17 ENCOUNTER — Other Ambulatory Visit: Payer: Self-pay

## 2018-04-17 DIAGNOSIS — I10 Essential (primary) hypertension: Secondary | ICD-10-CM

## 2018-04-17 DIAGNOSIS — R9431 Abnormal electrocardiogram [ECG] [EKG]: Secondary | ICD-10-CM

## 2018-04-22 ENCOUNTER — Telehealth: Payer: Self-pay | Admitting: *Deleted

## 2018-04-22 NOTE — Telephone Encounter (Signed)
RETURNED PATIENT'S PHONE CALL, SPOKE WITH PATIENT. ?

## 2018-04-28 ENCOUNTER — Telehealth: Payer: Self-pay | Admitting: *Deleted

## 2018-04-28 NOTE — Telephone Encounter (Signed)
Returned patient's phone call, lvm for a return call 

## 2018-05-05 ENCOUNTER — Inpatient Hospital Stay (HOSPITAL_COMMUNITY): Admission: RE | Admit: 2018-05-05 | Payer: Managed Care, Other (non HMO) | Source: Ambulatory Visit

## 2018-05-05 ENCOUNTER — Other Ambulatory Visit (HOSPITAL_COMMUNITY): Payer: Managed Care, Other (non HMO)

## 2018-05-12 ENCOUNTER — Ambulatory Visit (HOSPITAL_COMMUNITY): Admission: RE | Admit: 2018-05-12 | Payer: Managed Care, Other (non HMO) | Source: Home / Self Care | Admitting: Urology

## 2018-05-12 SURGERY — INSERTION, RADIATION SOURCE, PROSTATE
Anesthesia: General

## 2018-05-30 ENCOUNTER — Other Ambulatory Visit: Payer: Self-pay | Admitting: Urology

## 2018-06-05 ENCOUNTER — Other Ambulatory Visit: Payer: Self-pay | Admitting: Urology

## 2018-06-20 DIAGNOSIS — H269 Unspecified cataract: Secondary | ICD-10-CM | POA: Insufficient documentation

## 2018-06-20 DIAGNOSIS — K5730 Diverticulosis of large intestine without perforation or abscess without bleeding: Secondary | ICD-10-CM | POA: Insufficient documentation

## 2018-06-30 ENCOUNTER — Encounter (HOSPITAL_COMMUNITY)
Admission: RE | Admit: 2018-06-30 | Discharge: 2018-06-30 | Disposition: A | Discharge status: Home / Self Care | Payer: Managed Care, Other (non HMO) | Source: Ambulatory Visit | Attending: Urology | Admitting: Urology

## 2018-07-01 ENCOUNTER — Other Ambulatory Visit: Payer: Self-pay

## 2018-07-01 ENCOUNTER — Encounter (HOSPITAL_BASED_OUTPATIENT_CLINIC_OR_DEPARTMENT_OTHER): Payer: Self-pay | Admitting: *Deleted

## 2018-07-01 NOTE — Progress Notes (Addendum)
Spoke w/ pt via phone for pre-op interview.  Npo after mn.  Arrive at 1000.  Will take norvasc am dos w/ sips of water and do fleet enema am dos.  Getting lab work (cbc, cmp, pt/inr, ptt) and covid test done Thursday 07-03-2018 @ 1400.  Current ekg and cxr in epic and chart.  Cardiologist consult dated 04-07-2018 for abnormal ekg in epic and chart, dr patwardhan. Chart to anesthesia for review, Konrad Felix PA.

## 2018-07-02 ENCOUNTER — Other Ambulatory Visit (HOSPITAL_COMMUNITY): Payer: Managed Care, Other (non HMO)

## 2018-07-03 ENCOUNTER — Other Ambulatory Visit: Payer: Self-pay

## 2018-07-03 ENCOUNTER — Other Ambulatory Visit (HOSPITAL_COMMUNITY)
Admission: RE | Admit: 2018-07-03 | Discharge: 2018-07-03 | Disposition: A | Discharge status: Home / Self Care | Payer: Managed Care, Other (non HMO) | Source: Ambulatory Visit | Attending: Urology | Admitting: Urology

## 2018-07-03 ENCOUNTER — Encounter (HOSPITAL_COMMUNITY)
Admission: RE | Admit: 2018-07-03 | Discharge: 2018-07-03 | Disposition: A | Discharge status: Home / Self Care | Payer: Managed Care, Other (non HMO) | Source: Ambulatory Visit | Attending: Urology | Admitting: Urology

## 2018-07-03 DIAGNOSIS — Z1159 Encounter for screening for other viral diseases: Secondary | ICD-10-CM | POA: Diagnosis not present

## 2018-07-03 DIAGNOSIS — Z01812 Encounter for preprocedural laboratory examination: Secondary | ICD-10-CM | POA: Diagnosis present

## 2018-07-03 LAB — COMPREHENSIVE METABOLIC PANEL
ALT: 50 U/L — ABNORMAL HIGH (ref 0–44)
AST: 35 U/L (ref 15–41)
Albumin: 3.8 g/dL (ref 3.5–5.0)
Alkaline Phosphatase: 70 U/L (ref 38–126)
Anion gap: 6 (ref 5–15)
BUN: 21 mg/dL (ref 8–23)
CO2: 23 mmol/L (ref 22–32)
Calcium: 9.1 mg/dL (ref 8.9–10.3)
Chloride: 110 mmol/L (ref 98–111)
Creatinine, Ser: 0.84 mg/dL (ref 0.61–1.24)
GFR calc Af Amer: >60 mL/min (ref >60)
GFR calc non Af Amer: >60 mL/min (ref >60)
Glucose, Bld: 97 mg/dL (ref 70–99)
Potassium: 3.9 mmol/L (ref 3.5–5.1)
Sodium: 139 mmol/L (ref 135–145)
Total Bilirubin: 0.7 mg/dL (ref 0.3–1.2)
Total Protein: 7.3 g/dL (ref 6.5–8.1)

## 2018-07-03 LAB — CBC
HCT: 40 % (ref 39.0–52.0)
Hemoglobin: 13 g/dL (ref 13.0–17.0)
MCH: 28.4 pg (ref 26.0–34.0)
MCHC: 32.5 g/dL (ref 30.0–36.0)
MCV: 87.3 fL (ref 80.0–100.0)
Platelets: 238 10*3/uL (ref 150–400)
RBC: 4.58 MIL/uL (ref 4.22–5.81)
RDW: 12.8 % (ref 11.5–15.5)
WBC: 5 10*3/uL (ref 4.0–10.5)
nRBC: 0 % (ref 0.0–0.2)

## 2018-07-03 LAB — PROTIME-INR
INR: 1 (ref 0.8–1.2)
Prothrombin Time: 12.6 seconds (ref 11.4–15.2)

## 2018-07-03 LAB — APTT: aPTT: 29 seconds (ref 24–36)

## 2018-07-03 NOTE — Anesthesia Preprocedure Evaluation (Addendum)
Anesthesia Evaluation  Patient identified by MRN, date of birth, ID band Patient awake    Reviewed: Allergy & Precautions, NPO status , Patient's Chart, lab work & pertinent test results  Airway Mallampati: I       Dental  (+) Upper Dentures, Lower Dentures   Pulmonary neg pulmonary ROS,    Pulmonary exam normal        Cardiovascular hypertension, Pt. on medications Normal cardiovascular exam Rhythm:Regular Rate:Normal     Neuro/Psych negative neurological ROS  negative psych ROS   GI/Hepatic negative GI ROS, Neg liver ROS,   Endo/Other    Renal/GU      Musculoskeletal  (+) Arthritis , Osteoarthritis,    Abdominal Normal abdominal exam  (+)   Peds  Hematology negative hematology ROS (+)   Anesthesia Other Findings  Physician Assistant Certified Anesthesiology Progress Notes Signed Date of Service:  06/30/2018 2:00 PM    Related encounter: Pre-Admission Testing 11 from 06/30/2018 in [place]  Signed     Anesthesia Chart Review    Case:  [id number] Date/Time:  07/07/18 1140  Procedures:      RADIOACTIVE SEED IMPLANT/BRACHYTHERAPY IMPLANT (N/A )     SPACE OAR INSTILLATION (N/A )  Anesthesia type:  General  Pre-op diagnosis:  PROSTATE CANCER  Location:  [facility] OR ROOM 3 / [place]  Surgeon:  Kathie Rhodes, MD    DISCUSSION: 68 yo never smoker with h/o HLD, HTN, RBBB, prostate cancer scheduled for above procedure 07/07/2018 with Dr. Kathie Rhodes.   Pt seen by cardiologist, Dr. Vernell Leep, due to abnormal EKG on 04/07/2018.  Per his note, "The stress ECG was borderline positive in theanterolateral leads. However, the echo images were normalat rest and with stress so no evidence for ischemia. Goodexercise tolerance with no chest pain. I suspect that thisis a normal study."  Anticipate pt can proceed with planned procedure barring  acute status change.   VS: There were no vitals taken for this visit.  PROVIDERS: Audley Hose, MD is PCP   Vernell Leep, MD is Cardiologist  LABS: Labs reviewed: Acceptable for surgery. (all labs ordered are listed, but only abnormal results are displayed)  Labs Reviewed - No data to display   IMAGES:   EKG: 04/07/2018 Rate 60 bpm Sinus rhythm 60 bpm Incomplete RBBB Anterolateral T wave inversion, consider ischemia   CV: Echocardiogram 04/17/2018: Left ventricle cavity is normal in size. Moderate concentric hypertrophy of the left ventricle. Hyperdynamic global wall motion. Doppler evidence of grade I (impaired) diastolic dysfunction, normal LAP. Calculated EF 64%. Left atrial cavity is moderately dilated. Mild tricuspid regurgitation. Estimated pulmonary artery systolic pressure 26 mmHg. IVC is dilated with respiratory variation. Estimated RA pressure 8 mmHg.  Past Medical History: Diagnosis Date . Arthritis   shoulders . Full dentures  . History of kidney stones  . Hyperlipidemia  . Hypertension  . Prostate cancer ([place])  . Renal calculus, left  . Vertigo   hx of, x 1 - resolved    Past Surgical History: Procedure Laterality Date . CARDIAC CATHETERIZATION   . CERVICAL FUSION  1998 . COLONOSCOPY  2008 . LUMBAR LAMINECTOMY/DECOMPRESSION MICRODISCECTOMY Left 06/21/2012  Procedure: LUMBAR LAMINECTOMY/DECOMPRESSION MICRODISCECTOMY 1 LEVEL;  Surgeon: Hosie Spangle, MD;  Location: [facility];  Service: Neurosurgery;  Laterality: Left;  LEFT L3-4 extraforaminal microdiskectomy . MULTIPLE TOOTH EXTRACTIONS    full dentures   MEDICATIONS:  . amLODipine (NORVASC) 2.5 MG tablet . docusate sodium (COLACE) 100  MG capsule . rosuvastatin (CRESTOR) 10 MG tablet   No current facility-administered medications for this encounter.    Konrad Felix, PA-C [facility] Pre-Surgical Testing [phone] 07/03/18 10:07 AM           Reproductive/Obstetrics                            Anesthesia Physical Anesthesia Plan  ASA: II  Anesthesia Plan: General   Post-op Pain Management:    Induction: Intravenous  PONV Risk Score and Plan: 3 and Ondansetron and Dexamethasone  Airway Management Planned: LMA  Additional Equipment:   Intra-op Plan:   Post-operative Plan: Extubation in OR  Informed Consent: I have reviewed the patients History and Physical, chart, labs and discussed the procedure including the risks, benefits and alternatives for the proposed anesthesia with the patient or authorized representative who has indicated his/her understanding and acceptance.     Dental advisory given  Plan Discussed with: CRNA  Anesthesia Plan Comments:         Anesthesia Quick Evaluation

## 2018-07-03 NOTE — H&P (Signed)
HPI: Gregory Marshall is a 68 year-old male with prostate cancer.  Elevated PSA: His PSA in 11/19 was found to be 7.6. no family history. DRE - firm right lobe.  TRUS/BX 01/28/18: Prostate volume - 34 cc, capsule - intact.  Pathology: Adenocarcinoma 4+4 from the left base medial and lateral, 4+3 from left mid prostate lateral and 3+3 from the right apex for a total of 4/12 cores positive.  Stage: T2b  06/24/2018: Pre-operative appointment today prior to undergoing brachytherapy seed implantation with Dr Garnet Koyanagi on 07/07/2018. Procedure was delayed due to the ongoing Corona virus pandemic. He received Lupron as part of ADT in January of this year. He tolerated this well with only mild-intermittent hot flashes. No breast enlargement, nipple tenderness. No excessive fatigue. He continues to void at his baseline with no change in force of stream, increased frequency/urgency. No interval burning or painful urination, gross hematuria. No interval UTI treatment. In early March, he tells me his wife did test positive for Corona virus and has recovered from this. He quarantined in place for several weeks. He reports not developing any symptoms from the virus. He's not had f/c or other constitutional s/s of infection. Reports no changes in medical hx or daily medications.   His prostate cancer was diagnosed 01/28/2018. His PSA at his time of diagnosis was 7.6. His cancer was T2b, 4+4.   He has not undergone surgery for treatment. He has not undergone External Beam Radiation Therapy for treatment. He has undergone Hormonal Therapy for treatment.     ALLERGIES: No Allergies    MEDICATIONS: Crestor 40 mg tablet  Advil TABS Oral     GU PSH: Prostate Needle Biopsy - 01/28/2018      PSH Notes: Neck Surgery, Back Surgery   NON-GU PSH: Low Back Disk Surgery Surgical Pathology, Gross And Microscopic Examination For Prostate Needle - 01/28/2018    GU PMH: Renal calculus, Left, Nonobstructing, small  left renal calculi were identified on his CT scan. - 02/18/2018 Prostate Cancer, He is going to be scheduled for a bone scan and CT scan and then will return to go over the results and discuss options for management. - 02/04/2018 Elevated PSA, I noted some very subtle induration of the right lobe of his prostate. With PSA of 7.6 and a low free to total ratio I have recommended proceeding with TRUS/Bx. - 01/02/2018 Renal cyst, Left, Renal cyst, acquired, left - 2014      PMH Notes: Calculus disease: A CT scan done on 10/08/12 revealed a simple cyst in the left kidney. No renal calculi were noted on the right or left side. A minute (1 mm) stone was seen in the distal right ureter just above the ureterovesical junction.  Stone analysis: Calcium oxalate dihydrate 65% and calcium phosphate 35%   Left renal cyst: A simple cyst of the left kidney was identified on CT scan in 9/14.   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, lumbar region, Lumbar Disc Degeneration - 2014 Personal history of other endocrine, nutritional and metabolic disease, History of hypercholesterolemia - 2014    FAMILY HISTORY: 2 daughters - Daughter 1 son - Son Death In The Family Father - Runs In Family Death In The Family Mother - 86 In [place] is 1 daughter and 1 - Runs In [place] [employer] _4__ Living Daughter - Runs In Family nephrolithiasis - Aunt   SOCIAL HISTORY:  Marital Status: Married Preferred Language: English; Ethnicity: Not Hispanic Or Latino; Race: Black or African American Current Smoking Status: Patient has never smoked.   Tobacco Use Assessment Completed: Used Tobacco in last 30 days? Has never drank.  Drinks 1 caffeinated drink per day.     Notes: Alcohol Use, Never A Smoker, Occupation:, Marital History - Currently Married, Caffeine Use   REVIEW  OF SYSTEMS:    GU Review Male:   Patient denies frequent urination, hard to postpone urination, burning/ pain with urination, get up at night to urinate, leakage of urine, stream starts and stops, trouble starting your stream, have to strain to urinate , erection problems, and penile pain.  Gastrointestinal (Upper):   Patient denies nausea, vomiting, and indigestion/ heartburn.  Gastrointestinal (Lower):   Patient denies diarrhea and constipation.  Constitutional:   Hot Flashes. Patient denies fever, night sweats, weight loss, and fatigue.  Skin:   Patient denies skin rash/ lesion and itching.  Eyes:   Patient denies blurred vision and double vision.  Ears/ Nose/ Throat:   Patient denies sore throat and sinus problems.  Hematologic/Lymphatic:   Patient denies swollen glands and easy bruising.  Cardiovascular:   Patient denies leg swelling and chest pains.  Respiratory:   Patient denies cough and shortness of breath.  Endocrine:   Patient denies excessive thirst.  Musculoskeletal:   Patient denies back pain and joint pain.  Neurological:   Patient denies headaches and dizziness.  Psychologic:   Patient denies anxiety and depression.   VITAL SIGNS:    Weight 186 lb / 84.37 kg  Height 66 in / 167.64 cm  BP 124/77 mmHg  Pulse 59 /min  Temperature 98.2 F / 36.7 C  BMI 30.0 kg/m   MULTI-SYSTEM PHYSICAL EXAMINATION:    Constitutional: Well-nourished. No physical deformities. Normally developed. Good grooming.  Neck: Neck symmetrical, not swollen. Normal tracheal position.  Respiratory: No labored breathing, no use of accessory muscles.   Cardiovascular: Normal temperature, normal extremity pulses, no swelling, no varicosities.  Skin: No paleness, no jaundice, no cyanosis. No lesion, no ulcer, no rash.  Neurologic / Psychiatric: Oriented to time, oriented to place, oriented to person. No depression, no anxiety, no agitation.  Gastrointestinal: No mass, no tenderness, no rigidity, non obese  abdomen.  Musculoskeletal: Normal gait and station of head and neck.   GU PHYSICAL EXAMINATION:    Anus and Perineum: No hemorrhoids. No anal stenosis. No rectal fissure, no anal fissure. No edema, no dimple, no perineal tenderness, no anal tenderness.  Prostate: Right lobe firm. 40 gram or 2+ size. Left lobe normal consistency. Symmetrical lobes. No prostate nodule. Left lobe no tenderness, right lobe no tenderness.   Seminal Vesicles: Nonpalpable.  Sphincter Tone: Normal sphincter. No rectal tenderness. No rectal mass.      PAST DATA REVIEWED:  Source Of History:  Patient, Medical Record Summary  Records Review:   Pathology Reports, Previous Hospital Records, Previous Patient Records  Urine Test Review:   Urinalysis   12/10/17 10/03/12  PSA  Total PSA 7.6 ng/dl 2.35 ng/dl  % Free PSA 7.4 %     PROCEDURES:          Urinalysis - 81003 Dipstick Dipstick Cont'd  Color: Amber Bilirubin: Neg  Appearance: Clear Ketones: Trace  Specific Gravity: >1.030 Blood: Neg  pH: 5.5 Protein: Trace  Glucose: Neg Urobilinogen: 1.0    Nitrites: Neg    Leukocyte Esterase: Neg    ASSESSMENT/PLAN: Prostate Cancer - C61 After discussing the  options for treatment of his prostate cancer he has elected to proceed with radiation and will undergo neoadjuvant androgen deprivation with Lupron.    Renal calculus - N20.0 Left, Nonobstructing, small left renal calculi were identified on his CT scan.   We went over the results of his metastatic workup which has included a CT scan revealing no evidence of adenopathy or metastatic disease. His bone scan also was negative for metastases.   The patient was counseled about the natural history of prostate cancer and the standard treatment options that are available for prostate cancer. It was explained to him how his age and life expectancy, clinical stage, Gleason score, and PSA affect his prognosis, the decision to proceed with additional staging studies, as well  as how that information influences recommended treatment strategies. We discussed the roles for active surveillance, radiation therapy, surgical therapy, androgen deprivation, as well as ablative therapy options for the treatment of prostate cancer as appropriate to his individual cancer situation. We discussed the risks and benefits of these options with regard to their impact on cancer control and also in terms of potential adverse events, complications, and impact on quality of life particularly related to urinary, bowel, and sexual function. The patient was encouraged to ask questions throughout the discussion today and all questions were answered to his stated satisfaction.   Indicated that he would like to proceed with radiation rather than surgery and therefore we discussed the use of androgen deprivation in patients with high risk prostate cancer in order to improve results and therefore I went over Lupron injection with him today. We 1st discussed the mechanism of action of the medication and then I discussed the side effects

## 2018-07-03 NOTE — Progress Notes (Signed)
Anesthesia Chart Review   Case:  [id number] Date/Time:  07/07/18 1140   Procedures:      RADIOACTIVE SEED IMPLANT/BRACHYTHERAPY IMPLANT (N/A )     SPACE OAR INSTILLATION (N/A )   Anesthesia type:  General   Pre-op diagnosis:  PROSTATE CANCER   Location:  [facility] OR ROOM 3 / [place]   Surgeon:  Kathie Rhodes, MD      DISCUSSION: 68 yo never smoker with h/o HLD, HTN, RBBB, prostate cancer scheduled for above procedure 07/07/2018 with Dr. Kathie Rhodes.   Pt seen by cardiologist, Dr. Vernell Leep, due to abnormal EKG on 04/07/2018.  Per his note, "The stress ECG was borderline positive in theanterolateral leads. However, the echo images were normalat rest and with stress so no evidence for ischemia. Goodexercise tolerance with no chest pain. I suspect that thisis a normal study."  Anticipate pt can proceed with planned procedure barring acute status change.   VS: There were no vitals taken for this visit.  PROVIDERS: Audley Hose, MD is PCP   Vernell Leep, MD is Cardiologist  LABS: Labs reviewed: Acceptable for surgery. (all labs ordered are listed, but only abnormal results are displayed)  Labs Reviewed - No data to display   IMAGES:   EKG: 04/07/2018 Rate 60 bpm Sinus rhythm 60 bpm Incomplete RBBB Anterolateral T wave inversion, consider ischemia   CV: Echocardiogram 04/17/2018: Left ventricle cavity is normal in size. Moderate concentric hypertrophy of the left ventricle. Hyperdynamic global wall motion. Doppler evidence of grade I (impaired) diastolic dysfunction, normal LAP. Calculated EF 64%. Left atrial cavity is moderately dilated. Mild tricuspid regurgitation. Estimated pulmonary artery systolic pressure 26 mmHg. IVC is dilated with respiratory variation. Estimated RA pressure 8 mmHg. Past Medical History:  Diagnosis Date  . Arthritis    shoulders  . Full dentures   . History of kidney stones   . Hyperlipidemia   . Hypertension    . Prostate cancer ([place])   . Renal calculus, left   . Vertigo    hx of, x 1 - resolved    Past Surgical History:  Procedure Laterality Date  . CARDIAC CATHETERIZATION    . CERVICAL FUSION  1998  . COLONOSCOPY  2008  . LUMBAR LAMINECTOMY/DECOMPRESSION MICRODISCECTOMY Left 06/21/2012   Procedure: LUMBAR LAMINECTOMY/DECOMPRESSION MICRODISCECTOMY 1 LEVEL;  Surgeon: Hosie Spangle, MD;  Location: [facility];  Service: Neurosurgery;  Laterality: Left;  LEFT L3-4 extraforaminal microdiskectomy  . MULTIPLE TOOTH EXTRACTIONS     full dentures    MEDICATIONS: . amLODipine (NORVASC) 2.5 MG tablet  . docusate sodium (COLACE) 100 MG capsule  . rosuvastatin (CRESTOR) 10 MG tablet   No current facility-administered medications for this encounter.     [facility] Pre-Surgical Testing [phone] 07/03/18 10:07 AM

## 2018-07-04 ENCOUNTER — Telehealth: Payer: Self-pay | Admitting: *Deleted

## 2018-07-04 LAB — NOVEL CORONAVIRUS, NAA (HOSP ORDER, SEND-OUT TO REF LAB; TAT 18-24 HRS): SARS-CoV-2, NAA: NOT DETECTED

## 2018-07-04 NOTE — Progress Notes (Signed)
SPOKE W/  _pt     SCREENING SYMPTOMS OF COVID 19:   COUGH--no  RUNNY NOSE---no   SORE THROAT--no-  NASAL CONGESTION--no--  SNEEZING--no--  SHORTNESS OF BREATH--no-  DIFFICULTY BREATHING---no  TEMP >100.0 -----no  UNEXPLAINED BODY ACHES----no--  CHILLS ------no--   HEADACHES ----no-----  LOSS OF SMELL/ TASTE ---no-----    HAVE YOU OR ANY FAMILY MEMBER TRAVELLED PAST 14 DAYS OUT OF THE   COUNTY--no- STATE-no--- COUNTRY--no--  HAVE YOU OR ANY FAMILY MEMBER BEEN EXPOSED TO ANYONE WITH COVID 19? Wife tested positive in march but is asymptomatic and back at work

## 2018-07-04 NOTE — Telephone Encounter (Signed)
CALLED PATIENT TO REMIND OF IMPLANT FOR 07-07-18, SPOKE WITH PATIENT AND HE IS AWARE OF THIS PROCEDURE.

## 2018-07-07 ENCOUNTER — Ambulatory Visit (HOSPITAL_COMMUNITY): Payer: Managed Care, Other (non HMO)

## 2018-07-07 ENCOUNTER — Encounter (HOSPITAL_BASED_OUTPATIENT_CLINIC_OR_DEPARTMENT_OTHER)
Admission: RE | Disposition: A | Discharge status: Home / Self Care | Payer: Self-pay | Source: Home / Self Care | Attending: Urology

## 2018-07-07 ENCOUNTER — Ambulatory Visit (HOSPITAL_BASED_OUTPATIENT_CLINIC_OR_DEPARTMENT_OTHER)
Admission: RE | Admit: 2018-07-07 | Discharge: 2018-07-07 | Disposition: A | Discharge status: Home / Self Care | Payer: Managed Care, Other (non HMO) | Attending: Urology | Admitting: Urology

## 2018-07-07 ENCOUNTER — Encounter (HOSPITAL_BASED_OUTPATIENT_CLINIC_OR_DEPARTMENT_OTHER): Payer: Self-pay

## 2018-07-07 ENCOUNTER — Ambulatory Visit (HOSPITAL_BASED_OUTPATIENT_CLINIC_OR_DEPARTMENT_OTHER): Payer: Managed Care, Other (non HMO) | Admitting: Certified Registered"

## 2018-07-07 ENCOUNTER — Ambulatory Visit (HOSPITAL_BASED_OUTPATIENT_CLINIC_OR_DEPARTMENT_OTHER): Payer: Managed Care, Other (non HMO) | Admitting: Physician Assistant

## 2018-07-07 DIAGNOSIS — Z981 Arthrodesis status: Secondary | ICD-10-CM | POA: Diagnosis not present

## 2018-07-07 DIAGNOSIS — E7800 Pure hypercholesterolemia, unspecified: Secondary | ICD-10-CM | POA: Diagnosis not present

## 2018-07-07 DIAGNOSIS — N200 Calculus of kidney: Secondary | ICD-10-CM | POA: Insufficient documentation

## 2018-07-07 DIAGNOSIS — Z79899 Other long term (current) drug therapy: Secondary | ICD-10-CM | POA: Insufficient documentation

## 2018-07-07 DIAGNOSIS — M1990 Unspecified osteoarthritis, unspecified site: Secondary | ICD-10-CM | POA: Diagnosis not present

## 2018-07-07 DIAGNOSIS — I10 Essential (primary) hypertension: Secondary | ICD-10-CM | POA: Insufficient documentation

## 2018-07-07 DIAGNOSIS — Z841 Family history of disorders of kidney and ureter: Secondary | ICD-10-CM | POA: Insufficient documentation

## 2018-07-07 DIAGNOSIS — C61 Malignant neoplasm of prostate: Secondary | ICD-10-CM | POA: Diagnosis not present

## 2018-07-07 DIAGNOSIS — Z791 Long term (current) use of non-steroidal anti-inflammatories (NSAID): Secondary | ICD-10-CM | POA: Insufficient documentation

## 2018-07-07 DIAGNOSIS — I4510 Unspecified right bundle-branch block: Secondary | ICD-10-CM | POA: Insufficient documentation

## 2018-07-07 HISTORY — DX: Complete loss of teeth, unspecified cause, unspecified class: K08.109

## 2018-07-07 HISTORY — DX: Calculus of kidney: N20.0

## 2018-07-07 HISTORY — PX: RADIOACTIVE SEED IMPLANT: SHX5150

## 2018-07-07 HISTORY — DX: Personal history of urinary calculi: Z87.442

## 2018-07-07 HISTORY — PX: SPACE OAR INSTILLATION: SHX6769

## 2018-07-07 SURGERY — INSERTION, RADIATION SOURCE, PROSTATE
Anesthesia: General | Site: Prostate

## 2018-07-07 MED ORDER — SODIUM CHLORIDE (PF) 0.9 % IJ SOLN
INTRAMUSCULAR | Status: DC | PRN
  Administered 2018-07-07: 10 mL via INTRAVENOUS

## 2018-07-07 MED ORDER — MEPERIDINE HCL 25 MG/ML IJ SOLN
6.2500 mg | INTRAMUSCULAR | Status: DC | PRN
  Filled 2018-07-07: qty 1

## 2018-07-07 MED ORDER — DEXAMETHASONE SODIUM PHOSPHATE 10 MG/ML IJ SOLN
INTRAMUSCULAR | Status: DC | PRN
  Administered 2018-07-07: 5 mg via INTRAVENOUS

## 2018-07-07 MED ORDER — IOHEXOL 300 MG/ML  SOLN
INTRAMUSCULAR | Status: DC | PRN
  Administered 2018-07-07: 12:00:00 7 mL

## 2018-07-07 MED ORDER — ACETAMINOPHEN 160 MG/5ML PO SOLN
325.0000 mg | ORAL | Status: DC | PRN
  Filled 2018-07-07: qty 20.3

## 2018-07-07 MED ORDER — KETOROLAC TROMETHAMINE 15 MG/ML IJ SOLN
15.0000 mg | Freq: Once | INTRAMUSCULAR | Status: DC
  Filled 2018-07-07: qty 1

## 2018-07-07 MED ORDER — PROPOFOL 10 MG/ML IV BOLUS
INTRAVENOUS | Status: DC | PRN
  Administered 2018-07-07: 150 mg via INTRAVENOUS

## 2018-07-07 MED ORDER — ONDANSETRON HCL 4 MG/2ML IJ SOLN
INTRAMUSCULAR | Status: DC | PRN
  Administered 2018-07-07: 4 mg via INTRAVENOUS

## 2018-07-07 MED ORDER — LACTATED RINGERS IV SOLN
INTRAVENOUS | Status: DC
  Administered 2018-07-07 (×2): via INTRAVENOUS
  Filled 2018-07-07: qty 1000

## 2018-07-07 MED ORDER — FENTANYL CITRATE (PF) 100 MCG/2ML IJ SOLN
INTRAMUSCULAR | Status: DC | PRN
  Administered 2018-07-07 (×4): 25 ug via INTRAVENOUS

## 2018-07-07 MED ORDER — CIPROFLOXACIN HCL 500 MG PO TABS: 500.0000 mg | ORAL_TABLET | Freq: Two times a day (BID) | ORAL | 0 refills | Status: DC

## 2018-07-07 MED ORDER — LIDOCAINE 2% (20 MG/ML) 5 ML SYRINGE
INTRAMUSCULAR | Status: DC | PRN
  Administered 2018-07-07: 100 mg via INTRAVENOUS

## 2018-07-07 MED ORDER — CIPROFLOXACIN IN D5W 400 MG/200ML IV SOLN
400.0000 mg | INTRAVENOUS | Status: AC
  Administered 2018-07-07: 400 mg via INTRAVENOUS
  Filled 2018-07-07: qty 200

## 2018-07-07 MED ORDER — CIPROFLOXACIN IN D5W 400 MG/200ML IV SOLN
INTRAVENOUS | Status: AC
  Filled 2018-07-07: qty 200

## 2018-07-07 MED ORDER — FENTANYL CITRATE (PF) 100 MCG/2ML IJ SOLN
25.0000 ug | INTRAMUSCULAR | Status: DC | PRN
  Administered 2018-07-07: 50 ug via INTRAVENOUS
  Filled 2018-07-07: qty 1

## 2018-07-07 MED ORDER — ACETAMINOPHEN 325 MG PO TABS
325.0000 mg | ORAL_TABLET | ORAL | Status: DC | PRN
  Filled 2018-07-07: qty 2

## 2018-07-07 MED ORDER — OXYCODONE HCL 5 MG PO TABS
5.0000 mg | ORAL_TABLET | Freq: Once | ORAL | Status: DC | PRN
  Filled 2018-07-07: qty 1

## 2018-07-07 MED ORDER — FENTANYL CITRATE (PF) 100 MCG/2ML IJ SOLN
INTRAMUSCULAR | Status: AC
  Filled 2018-07-07: qty 2

## 2018-07-07 MED ORDER — OXYCODONE HCL 5 MG/5ML PO SOLN
5.0000 mg | Freq: Once | ORAL | Status: DC | PRN
  Filled 2018-07-07: qty 5

## 2018-07-07 MED ORDER — ONDANSETRON HCL 4 MG/2ML IJ SOLN
4.0000 mg | Freq: Once | INTRAMUSCULAR | Status: DC | PRN
  Filled 2018-07-07: qty 2

## 2018-07-07 MED ORDER — HYDROCODONE-ACETAMINOPHEN 10-325 MG PO TABS: 1.0000 | ORAL_TABLET | ORAL | 0 refills | Status: DC | PRN

## 2018-07-07 MED ORDER — FLEET ENEMA 7-19 GM/118ML RE ENEM
1.0000 | ENEMA | Freq: Once | RECTAL | Status: DC
  Filled 2018-07-07: qty 1

## 2018-07-07 MED ORDER — SODIUM CHLORIDE 0.9 % IV SOLN
INTRAVENOUS | Status: AC | PRN
  Administered 2018-07-07: 1000 mL

## 2018-07-07 SURGICAL SUPPLY — 45 items
BAG URINE DRAINAGE (UROLOGICAL SUPPLIES) ×3 IMPLANT
BLADE CLIPPER SENSICLIP SURGIC (BLADE) ×3 IMPLANT
CATH FOLEY 2WAY SLVR  5CC 16FR (CATHETERS) ×1
CATH FOLEY 2WAY SLVR 5CC 16FR (CATHETERS) ×2 IMPLANT
CATH ROBINSON RED A/P 16FR (CATHETERS) IMPLANT
CATH ROBINSON RED A/P 20FR (CATHETERS) ×3 IMPLANT
CLOTH BEACON ORANGE TIMEOUT ST (SAFETY) ×3 IMPLANT
CONT SPECI 4OZ STER CLIK (MISCELLANEOUS) ×6 IMPLANT
COVER BACK TABLE 60X90IN (DRAPES) ×3 IMPLANT
COVER MAYO STAND STRL (DRAPES) ×3 IMPLANT
COVER WAND RF STERILE (DRAPES) ×3 IMPLANT
DRSG TEGADERM 4X4.75 (GAUZE/BANDAGES/DRESSINGS) ×6 IMPLANT
DRSG TEGADERM 8X12 (GAUZE/BANDAGES/DRESSINGS) ×6 IMPLANT
GAUZE SPONGE 4X4 12PLY STRL (GAUZE/BANDAGES/DRESSINGS) ×3 IMPLANT
GLOVE BIO SURGEON STRL SZ 6 (GLOVE) IMPLANT
GLOVE BIO SURGEON STRL SZ 6.5 (GLOVE) ×6 IMPLANT
GLOVE BIO SURGEON STRL SZ7 (GLOVE) IMPLANT
GLOVE BIO SURGEON STRL SZ8 (GLOVE) ×3 IMPLANT
GLOVE BIOGEL PI IND STRL 6 (GLOVE) IMPLANT
GLOVE BIOGEL PI IND STRL 6.5 (GLOVE) ×4 IMPLANT
GLOVE BIOGEL PI IND STRL 7.0 (GLOVE) IMPLANT
GLOVE BIOGEL PI IND STRL 8 (GLOVE) IMPLANT
GLOVE BIOGEL PI INDICATOR 6 (GLOVE)
GLOVE BIOGEL PI INDICATOR 6.5 (GLOVE) ×2
GLOVE BIOGEL PI INDICATOR 7.0 (GLOVE)
GLOVE BIOGEL PI INDICATOR 8 (GLOVE)
GLOVE ECLIPSE 8.0 STRL XLNG CF (GLOVE) ×3 IMPLANT
GOWN STRL REUS W/ TWL LRG LVL3 (GOWN DISPOSABLE) ×6 IMPLANT
GOWN STRL REUS W/TWL LRG LVL3 (GOWN DISPOSABLE) ×3
GOWN STRL REUS W/TWL XL LVL3 (GOWN DISPOSABLE) ×3 IMPLANT
HOLDER FOLEY CATH W/STRAP (MISCELLANEOUS) IMPLANT
I-Seed AgX100 ×144 IMPLANT
IMPL SPACEOAR SYSTEM 10ML (Spacer) ×2 IMPLANT
IMPLANT SPACEOAR SYSTEM 10ML (Spacer) ×3 IMPLANT
IV NS 1000ML (IV SOLUTION) ×1
IV NS 1000ML BAXH (IV SOLUTION) ×2 IMPLANT
KIT TURNOVER CYSTO (KITS) ×3 IMPLANT
MARKER SKIN DUAL TIP RULER LAB (MISCELLANEOUS) ×3 IMPLANT
PACK CYSTO (CUSTOM PROCEDURE TRAY) ×3 IMPLANT
SURGILUBE 2OZ TUBE FLIPTOP (MISCELLANEOUS) ×3 IMPLANT
SUT BONE WAX W31G (SUTURE) IMPLANT
SYR 10ML LL (SYRINGE) IMPLANT
TOWEL OR 17X26 10 PK STRL BLUE (TOWEL DISPOSABLE) ×6 IMPLANT
UNDERPAD 30X30 (UNDERPADS AND DIAPERS) ×6 IMPLANT
WATER STERILE IRR 500ML POUR (IV SOLUTION) ×3 IMPLANT

## 2018-07-07 NOTE — Anesthesia Procedure Notes (Signed)
Procedure Name: LMA Insertion Date/Time: 07/07/2018 11:36 AM Performed by: Myna Bright, CRNA Pre-anesthesia Checklist: Patient identified, Emergency Drugs available, Suction available and Patient being monitored Patient Re-evaluated:Patient Re-evaluated prior to induction Oxygen Delivery Method: Circle system utilized Preoxygenation: Pre-oxygenation with 100% oxygen Induction Type: IV induction Ventilation: Mask ventilation without difficulty LMA: LMA inserted LMA Size: 5.0 Number of attempts: 1 Placement Confirmation: positive ETCO2 and breath sounds checked- equal and bilateral Tube secured with: Tape Dental Injury: Teeth and Oropharynx as per pre-operative assessment

## 2018-07-07 NOTE — Op Note (Signed)
PATIENT:  Gregory Marshall  PRE-OPERATIVE DIAGNOSIS:  Adenocarcinoma of the prostate  POST-OPERATIVE DIAGNOSIS:  Same  PROCEDURE:  1. I-125 radioactive seed implantation 2. Cystoscopy  3. Placement of SpaceOAR 4. Fluoroscopy use with time less than 1 hour.  SURGEON:  Surgeon(s): Claybon Jabs  Radiation oncologist: Dr. Tyler Pita  ANESTHESIA:  General  EBL:  Minimal  DRAINS: None  INDICATION: Gregory Marshall is a 68 year old male with biopsy proven adenocarcinoma the prostate.  He had some Gleason 4+4 as well as 4+3 and 3+3 with a total of 4/12 cores positive.  His clinical stage was T2b.  He has elected to proceed with radiation and presents today for radioactive seed implant which will then be followed by external beam radiation.  Description of procedure: After informed consent the patient was brought to the major OR, placed on the table and administered general anesthesia. He was then moved to the modified lithotomy position with his perineum perpendicular to the floor. His perineum and genitalia were then sterilely prepped. An official timeout was then performed. A 16 French Foley catheter was then placed in the bladder and filled with dilute contrast, a rectal tube was placed in the rectum and the transrectal ultrasound probe was placed in the rectum and affixed to the stand. He was then sterilely draped.  Real time ultrasonography was used along with the seed planning software On[employer] Prostate vs. 4.2.2.4. This was used to develop the seed plan including the number of needles as well as number of seeds required for complete and adequate coverage.  The needles were then preloaded with seeds and spacers according to the previously developed plan.  Real-time ultrasonography was then used along with the previously developed plan to implant a total of 48 seeds using 23 needles. This proceeded without difficulty or complication.   I then proceeded with placement of SpaceOAR by  introducing a needle with the bevel angled inferiorly approximately 2 cm superior to the anus. This was angled downward and under direct ultrasound was placed within the space between the prostatic capsule and rectum. This was confirmed with a small amount of sterile saline injected and this was performed under direct ultrasound. I then attached the SpaceOAR to the needle and injected this in the space between the prostate and rectum with good placement noted.  A Foley catheter was then removed as well as the transrectal ultrasound probe and rectal probe. Flexible cystoscopy was then performed using the 17 French flexible scope which revealed a normal urethra throughout its length down to the sphincter which appeared intact. The prostatic urethra revealed bilobar hypertrophy but no evidence of obstruction, seeds, spacers or lesions. The bladder was then entered and fully and systematically inspected. The ureteral orifices were noted to be of normal configuration and position. The mucosa revealed no evidence of tumors. There were also no stones identified within the bladder. I noted no seeds or spacers on the floor of the bladder and retroflexion of the scope revealed no seeds protruding from the base of the prostate.  C-arm fluoroscopy was then used to evaluate the distribution of seeds placement and aid in determining confirmation of the number of seeds placed.  Real-time fluoroscopy was used with saved images revealing the location of the seeds placed both in AP and oblique views.  The cystoscope was then removed and the patient was awakened and taken to recovery room in stable and satisfactory condition. He tolerated procedure well and there were no intraoperative complications.

## 2018-07-07 NOTE — Transfer of Care (Signed)
Immediate Anesthesia Transfer of Care Note  Patient: Gregory Marshall  Procedure(s) Performed: RADIOACTIVE SEED IMPLANT/BRACHYTHERAPY IMPLANT (N/A ) SPACE OAR INSTILLATION (N/A )  Patient Location: PACU  Anesthesia Type:General  Level of Consciousness: sedated and responds to stimulation  Airway & Oxygen Therapy: Patient Spontanous Breathing and Patient connected to face mask oxygen  Post-op Assessment: Report given to RN, Post -op Vital signs reviewed and stable and Patient moving all extremities  Post vital signs: Reviewed and stable  Last Vitals:  Vitals Value Taken Time  BP 126/76 07/07/2018  1:01 PM  Temp    Pulse 59 07/07/2018  1:06 PM  Resp 10 07/07/2018  1:06 PM  SpO2 100 % 07/07/2018  1:06 PM  Vitals shown include unvalidated device data.  Last Pain:  Vitals:   07/07/18 1300  TempSrc:   PainSc: 0-No pain      Patients Stated Pain Goal: 5 (33/38/32 9191)  Complications: No apparent anesthesia complications

## 2018-07-07 NOTE — Discharge Instructions (Signed)
DISCHARGE INSTRUCTIONS FOR PROSTATE SEED IMPLANTATION ° °Antibiotics °You may be given a prescription for an antibiotic to take when you arrive home. If so, be sure to take every tablet in the bottle, even if you are feeling better before the prescription is finished. If you begin itching, notice a rash or start to swell on your trunk, arms, legs and/or throat, immediately stop taking the antibiotic and call your Urologist. °Diet °Resume your usual diet when you return home. To keep your bowels moving easily and softly, drink prune, apple and cranberry juice at room temperature. You may also take a stool softener, such as Colace, which is available without prescription at local pharmacies. °Daily activities °? No driving or heavy lifting for at least two days after the implant. °? No bike riding, horseback riding or riding lawn mowers for the first month after the implant. °? Any strenuous physical activity should be approved by your doctor before you resume it. °Sexual relations °You may resume sexual relations two weeks after the procedure. A condom should be used for the first two weeks. Your semen may be dark brown or black; this is normal and is related bleeding that may have occurred during the implant. °Postoperative swelling °Expect swelling and bruising of the scrotum and perineum (the area between the scrotum and anus). Both the swelling and the bruising should resolve in l or 2 weeks. Ice packs and over- the-counter medications such as Tylenol, Advil or Aleve may lessen your discomfort. °Postoperative urination °Most men experience burning on urination and/or urinary frequency. If this becomes bothersome, contact your Urologist.  Medication can be prescribed to relieve these problems.  It is normal to have some blood in your urine for a few days after the implant. °Special instructions related to the seeds °It is unlikely that you will pass an Iodine-125 seed in your urine. The seeds are silver in color  and are about as large as a grain of rice. If you pass a seed, do not handle it with your fingers. Use a spoon to place it in an envelope or jar in place this in base occluded area such as the garage or basement for return to the radiation clinic at your convenience. ° °Contact your doctor for °? Temperature greater than 101 F °? Increasing pain °? Inability to urinate °Follow-up ° You should have follow up with your urologist and radiation oncologist about 3 weeks after the procedure. °General information regarding Iodine seeds °? Iodine-125 is a low energy radioactive material. It is not deeply penetrating and loses energy at short distances. Your prostate will absorb the radiation. Objects that are touched or used by the patient do not become radioactive. °? Body wastes (urine and stool) or body fluids (saliva, tears, semen or blood) are not radioactive. °? The Nuclear Regulatory Commission (NRC) has determined that no radiation precautions are needed for patients undergoing Iodine-125 seed implantation. The NRC states that such patients do not present a risk to the people around them, including young children and pregnant women. However, in keeping with the general principle that radiation exposure should be kept as low reasonably possible, we suggest the following: °? Children and pets should not sit on the patient's lap for the first two (2) weeks after the implant. °? Pregnant (or possibly pregnant) women should avoid prolonged, close contact with the patient for the first two (2) weeks after the implant. °? A distance of three (3) feet is acceptable. °? At a distance of three (3)   with the patient.     Post Anesthesia Home Care Instructions  Activity: Get plenty of rest for the remainder of the day. A responsible individual must stay with you for 24 hours following the procedure.  For the next 24 hours, DO NOT: -Drive a car -Operate machinery -Drink alcoholic  beverages -Take any medication unless instructed by your physician -Make any legal decisions or sign important papers.  Meals: Start with liquid foods such as gelatin or soup. Progress to regular foods as tolerated. Avoid greasy, spicy, heavy foods. If nausea and/or vomiting occur, drink only clear liquids until the nausea and/or vomiting subsides. Call your physician if vomiting continues.  Special Instructions/Symptoms: Your throat may feel dry or sore from the anesthesia or the breathing tube placed in your throat during surgery. If this causes discomfort, gargle with warm salt water. The discomfort should disappear within 24 hours.      

## 2018-07-07 NOTE — Anesthesia Postprocedure Evaluation (Signed)
Anesthesia Post Note  Patient: Gregory Marshall  Procedure(s) Performed: RADIOACTIVE SEED IMPLANT/BRACHYTHERAPY IMPLANT (N/A Prostate) SPACE OAR INSTILLATION (N/A )     Patient location during evaluation: PACU Anesthesia Type: General Level of consciousness: sedated Pain management: pain level controlled Vital Signs Assessment: post-procedure vital signs reviewed and stable Respiratory status: spontaneous breathing Cardiovascular status: stable Postop Assessment: no apparent nausea or vomiting Anesthetic complications: no    Last Vitals:  Vitals:   07/07/18 1300 07/07/18 1315  BP: 126/76 114/65  Pulse: 73 (!) 55  Resp: 11 11  Temp: (!) 36.3 C   SpO2: 95% 100%    Last Pain:  Vitals:   07/07/18 1300  TempSrc:   PainSc: 0-No pain   Pain Goal: Patients Stated Pain Goal: 5 (07/07/18 1000)                 Huston Foley

## 2018-07-08 ENCOUNTER — Encounter: Payer: Self-pay | Admitting: Medical Oncology

## 2018-07-08 ENCOUNTER — Encounter (HOSPITAL_BASED_OUTPATIENT_CLINIC_OR_DEPARTMENT_OTHER): Payer: Self-pay | Admitting: Urology

## 2018-07-13 NOTE — Progress Notes (Signed)
  Radiation Oncology         [phone] ________________________________  Name: Gregory Marshall MRN: [id number]  Date: 07/07/2018  DOB: 05/29/1950       Prostate Seed Implant  CC:Bakare, Mobolaji B, MD  No ref. provider found  DIAGNOSIS: 68 yo gentleman with Stage T2b adenocarcinoma of the prostate with Gleason score of 4+4, and PSA of 7.6.    ICD-10-CM   1. Malignant neoplasm of prostate [facility])  [place] Discharge patient    PROCEDURE: Insertion of radioactive I-125 seeds into the prostate gland.  RADIATION DOSE: 110 Gy, boost therapy.  TECHNIQUE: Gregory Marshall was brought to the operating room with the urologist. He was placed in the dorsolithotomy position. He was catheterized and a rectal tube was inserted. The perineum was shaved, prepped and draped. The ultrasound probe was then introduced into the rectum to see the prostate gland.  TREATMENT DEVICE: A needle grid was attached to the ultrasound probe stand and anchor needles were placed.  3D PLANNING: The prostate was imaged in 3D using a sagittal sweep of the prostate probe. These images were transferred to the planning computer. There, the prostate, urethra and rectum were defined on each axial reconstructed image. Then, the software created an optimized 3D plan and a few seed positions were adjusted. The quality of the plan was reviewed using [facility] information for the target and the following two organs at risk:  Urethra and Rectum.  Then the accepted plan was printed and handed off to the radiation therapist.  Under my supervision, the custom loading of the seeds and spacers was carried out and loaded into sealed vicryl sleeves.  These pre-loaded needles were then placed into the needle holder.Marland Kitchen  PROSTATE VOLUME STUDY:  Using transrectal ultrasound the volume of the prostate was verified to be 21.2 cc.  SPECIAL TREATMENT PROCEDURE/SUPERVISION AND HANDLING: The pre-loaded needles were then delivered under sagittal guidance. A total of 23  needles were used to deposit 48 seeds in the prostate gland. The individual seed activity was 0.337 mCi.  SpaceOAR:  Yes  COMPLEX SIMULATION: At the end of the procedure, an anterior radiograph of the pelvis was obtained to document seed positioning and count. Cystoscopy was performed to check the urethra and bladder.  MICRODOSIMETRY: At the end of the procedure, the patient was emitting 0.05 mR/hr at 1 meter. Accordingly, he was considered safe for hospital discharge.  PLAN: The patient will return to the radiation oncology clinic for post implant CT dosimetry in three weeks.   ________________________________  Sheral Apley Tammi Klippel, M.D.

## 2018-07-15 ENCOUNTER — Telehealth: Payer: Self-pay | Admitting: *Deleted

## 2018-07-15 NOTE — Telephone Encounter (Signed)
CALLED PATIENT TO REMIND OF POST SEED APPTS. AND MRI, LVM FOR A RETURN CALL

## 2018-07-16 ENCOUNTER — Telehealth: Payer: Self-pay | Admitting: *Deleted

## 2018-07-16 NOTE — Telephone Encounter (Signed)
Returned patient's phone call, spoke with patient 

## 2018-07-17 ENCOUNTER — Other Ambulatory Visit: Payer: Self-pay

## 2018-07-17 ENCOUNTER — Ambulatory Visit (HOSPITAL_COMMUNITY)
Admission: RE | Admit: 2018-07-17 | Discharge: 2018-07-17 | Disposition: A | Discharge status: Home / Self Care | Payer: Managed Care, Other (non HMO) | Source: Ambulatory Visit | Attending: Urology | Admitting: Urology

## 2018-07-17 ENCOUNTER — Ambulatory Visit: Payer: Managed Care, Other (non HMO) | Admitting: Urology

## 2018-07-17 ENCOUNTER — Ambulatory Visit: Payer: Managed Care, Other (non HMO) | Admitting: Radiation Oncology

## 2018-07-17 ENCOUNTER — Ambulatory Visit
Admission: RE | Admit: 2018-07-17 | Discharge: 2018-07-17 | Disposition: A | Discharge status: Home / Self Care | Payer: Managed Care, Other (non HMO) | Source: Ambulatory Visit | Attending: Radiation Oncology | Admitting: Radiation Oncology

## 2018-07-17 DIAGNOSIS — C61 Malignant neoplasm of prostate: Secondary | ICD-10-CM

## 2018-07-17 DIAGNOSIS — Z510 Encounter for antineoplastic radiation therapy: Secondary | ICD-10-CM | POA: Diagnosis not present

## 2018-07-21 NOTE — Progress Notes (Signed)
  Radiation Oncology         [phone] ________________________________  Name: Gregory Marshall MRN: [id number]  Date: 07/17/2018  DOB: 01-Jan-1951  SIMULATION AND TREATMENT PLANNING NOTE    ICD-10-CM   1. Malignant neoplasm of prostate ([place])  C61     DIAGNOSIS:  68 yo gentleman with Stage T2b adenocarcinoma of the prostate with Gleason score of 4+4, and PSA of 7.6  NARRATIVE:  The patient was brought to the [place].  Identity was confirmed.  All relevant records and images related to the planned course of therapy were reviewed.  The patient freely provided informed written consent to proceed with treatment after reviewing the details related to the planned course of therapy. The consent form was witnessed and verified by the simulation staff.  Then, the patient was set-up in a stable reproducible supine position for radiation therapy.  A vacuum lock pillow device was custom fabricated to position his legs in a reproducible immobilized position.  Then, I performed a urethrogram under sterile conditions to identify the prostatic apex.  CT images were obtained.  Surface markings were placed.  The CT images were loaded into the planning software.  Then the prostate target and avoidance structures including the rectum, bladder, bowel and hips were contoured.  Treatment planning then occurred.  The radiation prescription was entered and confirmed.  A total of one complex treatment devices were fabricated. I have requested : Intensity Modulated Radiotherapy (IMRT) is medically necessary for this case for the following reason:  Rectal sparing.Marland Kitchen  PLAN:  The patient will receive 45 Gy in 25 fractions of 1.8 Gy, to supplement an up-front prostate seed implant boost of 110 Gy to achieve a total nominal dose of 165 Gy.  ________________________________  Sheral Apley Tammi Klippel, M.D.

## 2018-07-23 DIAGNOSIS — C61 Malignant neoplasm of prostate: Secondary | ICD-10-CM | POA: Diagnosis not present

## 2018-07-28 ENCOUNTER — Encounter: Payer: Self-pay | Admitting: Medical Oncology

## 2018-07-28 ENCOUNTER — Other Ambulatory Visit: Payer: Self-pay

## 2018-07-28 ENCOUNTER — Ambulatory Visit
Admission: RE | Admit: 2018-07-28 | Discharge: 2018-07-28 | Disposition: A | Discharge status: Home / Self Care | Payer: Managed Care, Other (non HMO) | Source: Ambulatory Visit | Attending: Radiation Oncology | Admitting: Radiation Oncology

## 2018-07-28 DIAGNOSIS — C61 Malignant neoplasm of prostate: Secondary | ICD-10-CM | POA: Diagnosis not present

## 2018-07-29 ENCOUNTER — Other Ambulatory Visit: Payer: Self-pay

## 2018-07-29 ENCOUNTER — Ambulatory Visit
Admission: RE | Admit: 2018-07-29 | Discharge: 2018-07-29 | Disposition: A | Discharge status: Home / Self Care | Payer: Managed Care, Other (non HMO) | Source: Ambulatory Visit | Attending: Radiation Oncology | Admitting: Radiation Oncology

## 2018-07-29 DIAGNOSIS — C61 Malignant neoplasm of prostate: Secondary | ICD-10-CM | POA: Diagnosis not present

## 2018-07-30 ENCOUNTER — Ambulatory Visit
Admission: RE | Admit: 2018-07-30 | Discharge: 2018-07-30 | Disposition: A | Discharge status: Home / Self Care | Payer: Managed Care, Other (non HMO) | Source: Ambulatory Visit | Attending: Radiation Oncology | Admitting: Radiation Oncology

## 2018-07-30 ENCOUNTER — Other Ambulatory Visit: Payer: Self-pay

## 2018-07-30 DIAGNOSIS — Z510 Encounter for antineoplastic radiation therapy: Secondary | ICD-10-CM | POA: Insufficient documentation

## 2018-07-30 DIAGNOSIS — C61 Malignant neoplasm of prostate: Secondary | ICD-10-CM | POA: Diagnosis present

## 2018-07-31 ENCOUNTER — Ambulatory Visit
Admission: RE | Admit: 2018-07-31 | Discharge: 2018-07-31 | Disposition: A | Discharge status: Home / Self Care | Payer: Managed Care, Other (non HMO) | Source: Ambulatory Visit | Attending: Radiation Oncology | Admitting: Radiation Oncology

## 2018-07-31 ENCOUNTER — Other Ambulatory Visit: Payer: Self-pay

## 2018-07-31 DIAGNOSIS — C61 Malignant neoplasm of prostate: Secondary | ICD-10-CM | POA: Diagnosis not present

## 2018-08-04 ENCOUNTER — Other Ambulatory Visit: Payer: Self-pay

## 2018-08-04 ENCOUNTER — Ambulatory Visit
Admission: RE | Admit: 2018-08-04 | Discharge: 2018-08-04 | Disposition: A | Discharge status: Home / Self Care | Payer: Managed Care, Other (non HMO) | Source: Ambulatory Visit | Attending: Radiation Oncology | Admitting: Radiation Oncology

## 2018-08-04 DIAGNOSIS — C61 Malignant neoplasm of prostate: Secondary | ICD-10-CM | POA: Diagnosis not present

## 2018-08-05 ENCOUNTER — Other Ambulatory Visit: Payer: Self-pay

## 2018-08-05 ENCOUNTER — Ambulatory Visit
Admission: RE | Admit: 2018-08-05 | Discharge: 2018-08-05 | Disposition: A | Discharge status: Home / Self Care | Payer: Managed Care, Other (non HMO) | Source: Ambulatory Visit | Attending: Radiation Oncology | Admitting: Radiation Oncology

## 2018-08-05 ENCOUNTER — Encounter: Payer: Self-pay | Admitting: Radiation Oncology

## 2018-08-05 ENCOUNTER — Telehealth: Payer: Self-pay | Admitting: Radiation Oncology

## 2018-08-05 DIAGNOSIS — C61 Malignant neoplasm of prostate: Secondary | ICD-10-CM | POA: Diagnosis not present

## 2018-08-05 NOTE — Telephone Encounter (Signed)
Faxed copy of radiation records to [facility] as requested. Confirmation fax of delivery obtained.

## 2018-08-06 ENCOUNTER — Ambulatory Visit
Admission: RE | Admit: 2018-08-06 | Discharge: 2018-08-06 | Disposition: A | Discharge status: Home / Self Care | Payer: Managed Care, Other (non HMO) | Source: Ambulatory Visit | Attending: Radiation Oncology | Admitting: Radiation Oncology

## 2018-08-06 ENCOUNTER — Other Ambulatory Visit: Payer: Self-pay

## 2018-08-06 DIAGNOSIS — C61 Malignant neoplasm of prostate: Secondary | ICD-10-CM | POA: Diagnosis not present

## 2018-08-07 ENCOUNTER — Other Ambulatory Visit: Payer: Self-pay

## 2018-08-07 ENCOUNTER — Ambulatory Visit
Admission: RE | Admit: 2018-08-07 | Discharge: 2018-08-07 | Disposition: A | Discharge status: Home / Self Care | Payer: Managed Care, Other (non HMO) | Source: Ambulatory Visit | Attending: Radiation Oncology | Admitting: Radiation Oncology

## 2018-08-07 DIAGNOSIS — C61 Malignant neoplasm of prostate: Secondary | ICD-10-CM | POA: Diagnosis not present

## 2018-08-08 ENCOUNTER — Ambulatory Visit
Admission: RE | Admit: 2018-08-08 | Discharge: 2018-08-08 | Disposition: A | Discharge status: Home / Self Care | Payer: Managed Care, Other (non HMO) | Source: Ambulatory Visit | Attending: Radiation Oncology | Admitting: Radiation Oncology

## 2018-08-08 ENCOUNTER — Other Ambulatory Visit: Payer: Self-pay

## 2018-08-08 DIAGNOSIS — C61 Malignant neoplasm of prostate: Secondary | ICD-10-CM | POA: Diagnosis not present

## 2018-08-11 ENCOUNTER — Other Ambulatory Visit: Payer: Self-pay

## 2018-08-11 ENCOUNTER — Ambulatory Visit
Admission: RE | Admit: 2018-08-11 | Discharge: 2018-08-11 | Disposition: A | Discharge status: Home / Self Care | Payer: Managed Care, Other (non HMO) | Source: Ambulatory Visit | Attending: Radiation Oncology | Admitting: Radiation Oncology

## 2018-08-11 DIAGNOSIS — C61 Malignant neoplasm of prostate: Secondary | ICD-10-CM | POA: Diagnosis not present

## 2018-08-12 ENCOUNTER — Ambulatory Visit
Admission: RE | Admit: 2018-08-12 | Discharge: 2018-08-12 | Disposition: A | Discharge status: Home / Self Care | Payer: Managed Care, Other (non HMO) | Source: Ambulatory Visit | Attending: Radiation Oncology | Admitting: Radiation Oncology

## 2018-08-12 ENCOUNTER — Other Ambulatory Visit: Payer: Self-pay

## 2018-08-12 DIAGNOSIS — C61 Malignant neoplasm of prostate: Secondary | ICD-10-CM | POA: Diagnosis not present

## 2018-08-13 ENCOUNTER — Ambulatory Visit
Admission: RE | Admit: 2018-08-13 | Discharge: 2018-08-13 | Disposition: A | Discharge status: Home / Self Care | Payer: Managed Care, Other (non HMO) | Source: Ambulatory Visit | Attending: Radiation Oncology | Admitting: Radiation Oncology

## 2018-08-13 ENCOUNTER — Other Ambulatory Visit: Payer: Self-pay

## 2018-08-13 DIAGNOSIS — C61 Malignant neoplasm of prostate: Secondary | ICD-10-CM | POA: Diagnosis not present

## 2018-08-14 ENCOUNTER — Ambulatory Visit
Admission: RE | Admit: 2018-08-14 | Discharge: 2018-08-14 | Disposition: A | Discharge status: Home / Self Care | Payer: Managed Care, Other (non HMO) | Source: Ambulatory Visit | Attending: Radiation Oncology | Admitting: Radiation Oncology

## 2018-08-14 ENCOUNTER — Other Ambulatory Visit: Payer: Self-pay

## 2018-08-14 DIAGNOSIS — C61 Malignant neoplasm of prostate: Secondary | ICD-10-CM | POA: Diagnosis not present

## 2018-08-15 ENCOUNTER — Ambulatory Visit
Admission: RE | Admit: 2018-08-15 | Discharge: 2018-08-15 | Disposition: A | Discharge status: Home / Self Care | Payer: Managed Care, Other (non HMO) | Source: Ambulatory Visit | Attending: Radiation Oncology | Admitting: Radiation Oncology

## 2018-08-15 ENCOUNTER — Other Ambulatory Visit: Payer: Self-pay

## 2018-08-15 DIAGNOSIS — C61 Malignant neoplasm of prostate: Secondary | ICD-10-CM | POA: Diagnosis not present

## 2018-08-17 NOTE — Progress Notes (Signed)
  Radiation Oncology         [phone] ________________________________  Name: Sallie Maker MRN: [id number]  Date: 08/05/2018  DOB: [birth year]  3D Planning Note   Prostate Brachytherapy Post-Implant Dosimetry  Diagnosis: 68 y.o. 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   Narrative: On a previous date, Trindon Dorton returned following prostate seed implantation for post implant planning. He underwent CT scan complex simulation to delineate the three-dimensional structures of the pelvis and demonstrate the radiation distribution.  Since that time, the seed localization, and complex isodose planning with dose volume histograms have now been completed.  Results:   Prostate Coverage - The dose of radiation delivered to the 90% or more of the prostate gland (D90) was 111.85% of the prescription dose. This exceeds our goal of greater than 90%. Rectal Sparing - The volume of rectal tissue receiving the prescription dose or higher was 0.0 cc. This falls under our thresholds tolerance of 1.0 cc.  Impression: The prostate seed implant appears to show adequate target coverage and appropriate rectal sparing.  Plan:  The patient will continue to follow with urology for ongoing PSA determinations. I would anticipate a high likelihood for local tumor control with minimal risk for rectal morbidity.  ________________________________  Sheral Apley Tammi Klippel, M.D.

## 2018-08-18 ENCOUNTER — Other Ambulatory Visit: Payer: Self-pay

## 2018-08-18 ENCOUNTER — Ambulatory Visit
Admission: RE | Admit: 2018-08-18 | Discharge: 2018-08-18 | Disposition: A | Discharge status: Home / Self Care | Payer: Managed Care, Other (non HMO) | Source: Ambulatory Visit | Attending: Radiation Oncology | Admitting: Radiation Oncology

## 2018-08-18 DIAGNOSIS — C61 Malignant neoplasm of prostate: Secondary | ICD-10-CM | POA: Diagnosis not present

## 2018-08-19 ENCOUNTER — Ambulatory Visit
Admission: RE | Admit: 2018-08-19 | Discharge: 2018-08-19 | Disposition: A | Discharge status: Home / Self Care | Payer: Managed Care, Other (non HMO) | Source: Ambulatory Visit | Attending: Radiation Oncology | Admitting: Radiation Oncology

## 2018-08-19 ENCOUNTER — Other Ambulatory Visit: Payer: Self-pay

## 2018-08-19 DIAGNOSIS — C61 Malignant neoplasm of prostate: Secondary | ICD-10-CM | POA: Diagnosis not present

## 2018-08-20 ENCOUNTER — Ambulatory Visit
Admission: RE | Admit: 2018-08-20 | Discharge: 2018-08-20 | Disposition: A | Discharge status: Home / Self Care | Payer: Managed Care, Other (non HMO) | Source: Ambulatory Visit | Attending: Radiation Oncology | Admitting: Radiation Oncology

## 2018-08-20 DIAGNOSIS — C61 Malignant neoplasm of prostate: Secondary | ICD-10-CM | POA: Diagnosis not present

## 2018-08-21 ENCOUNTER — Other Ambulatory Visit: Payer: Self-pay

## 2018-08-21 ENCOUNTER — Ambulatory Visit
Admission: RE | Admit: 2018-08-21 | Discharge: 2018-08-21 | Disposition: A | Discharge status: Home / Self Care | Payer: Managed Care, Other (non HMO) | Source: Ambulatory Visit | Attending: Radiation Oncology | Admitting: Radiation Oncology

## 2018-08-21 DIAGNOSIS — C61 Malignant neoplasm of prostate: Secondary | ICD-10-CM | POA: Diagnosis not present

## 2018-08-22 ENCOUNTER — Ambulatory Visit
Admission: RE | Admit: 2018-08-22 | Discharge: 2018-08-22 | Disposition: A | Discharge status: Home / Self Care | Payer: Managed Care, Other (non HMO) | Source: Ambulatory Visit | Attending: Radiation Oncology | Admitting: Radiation Oncology

## 2018-08-22 ENCOUNTER — Other Ambulatory Visit: Payer: Self-pay

## 2018-08-22 DIAGNOSIS — C61 Malignant neoplasm of prostate: Secondary | ICD-10-CM | POA: Diagnosis not present

## 2018-08-25 ENCOUNTER — Other Ambulatory Visit: Payer: Self-pay

## 2018-08-25 ENCOUNTER — Ambulatory Visit
Admission: RE | Admit: 2018-08-25 | Discharge: 2018-08-25 | Disposition: A | Discharge status: Home / Self Care | Payer: Managed Care, Other (non HMO) | Source: Ambulatory Visit | Attending: Radiation Oncology | Admitting: Radiation Oncology

## 2018-08-25 DIAGNOSIS — C61 Malignant neoplasm of prostate: Secondary | ICD-10-CM | POA: Diagnosis not present

## 2018-08-26 ENCOUNTER — Other Ambulatory Visit: Payer: Self-pay

## 2018-08-26 ENCOUNTER — Ambulatory Visit
Admission: RE | Admit: 2018-08-26 | Discharge: 2018-08-26 | Disposition: A | Discharge status: Home / Self Care | Payer: Managed Care, Other (non HMO) | Source: Ambulatory Visit | Attending: Radiation Oncology | Admitting: Radiation Oncology

## 2018-08-26 DIAGNOSIS — C61 Malignant neoplasm of prostate: Secondary | ICD-10-CM | POA: Diagnosis not present

## 2018-08-27 ENCOUNTER — Ambulatory Visit
Admission: RE | Admit: 2018-08-27 | Discharge: 2018-08-27 | Disposition: A | Discharge status: Home / Self Care | Payer: Managed Care, Other (non HMO) | Source: Ambulatory Visit | Attending: Radiation Oncology | Admitting: Radiation Oncology

## 2018-08-27 ENCOUNTER — Other Ambulatory Visit: Payer: Self-pay

## 2018-08-27 DIAGNOSIS — C61 Malignant neoplasm of prostate: Secondary | ICD-10-CM | POA: Diagnosis not present

## 2018-08-28 ENCOUNTER — Other Ambulatory Visit: Payer: Self-pay

## 2018-08-28 ENCOUNTER — Ambulatory Visit
Admission: RE | Admit: 2018-08-28 | Discharge: 2018-08-28 | Disposition: A | Discharge status: Home / Self Care | Payer: Managed Care, Other (non HMO) | Source: Ambulatory Visit | Attending: Radiation Oncology | Admitting: Radiation Oncology

## 2018-08-28 DIAGNOSIS — C61 Malignant neoplasm of prostate: Secondary | ICD-10-CM | POA: Diagnosis not present

## 2018-08-29 ENCOUNTER — Ambulatory Visit
Admission: RE | Admit: 2018-08-29 | Discharge: 2018-08-29 | Disposition: A | Discharge status: Home / Self Care | Payer: Managed Care, Other (non HMO) | Source: Ambulatory Visit | Attending: Radiation Oncology | Admitting: Radiation Oncology

## 2018-08-29 ENCOUNTER — Other Ambulatory Visit: Payer: Self-pay

## 2018-08-29 DIAGNOSIS — C61 Malignant neoplasm of prostate: Secondary | ICD-10-CM | POA: Diagnosis not present

## 2018-09-01 ENCOUNTER — Ambulatory Visit
Admission: RE | Admit: 2018-09-01 | Discharge: 2018-09-01 | Disposition: A | Discharge status: Home / Self Care | Payer: Managed Care, Other (non HMO) | Source: Ambulatory Visit | Attending: Radiation Oncology | Admitting: Radiation Oncology

## 2018-09-01 ENCOUNTER — Encounter: Payer: Self-pay | Admitting: Radiation Oncology

## 2018-09-01 ENCOUNTER — Encounter: Payer: Self-pay | Admitting: Medical Oncology

## 2018-09-01 ENCOUNTER — Other Ambulatory Visit: Payer: Self-pay

## 2018-09-01 DIAGNOSIS — Z510 Encounter for antineoplastic radiation therapy: Secondary | ICD-10-CM | POA: Insufficient documentation

## 2018-09-01 DIAGNOSIS — C61 Malignant neoplasm of prostate: Secondary | ICD-10-CM | POA: Insufficient documentation

## 2018-09-03 ENCOUNTER — Encounter: Payer: Self-pay | Admitting: Podiatry

## 2018-09-03 ENCOUNTER — Other Ambulatory Visit: Payer: Self-pay

## 2018-09-03 ENCOUNTER — Other Ambulatory Visit: Payer: Self-pay | Admitting: Podiatry

## 2018-09-03 ENCOUNTER — Ambulatory Visit (INDEPENDENT_AMBULATORY_CARE_PROVIDER_SITE_OTHER): Payer: Managed Care, Other (non HMO)

## 2018-09-03 ENCOUNTER — Ambulatory Visit: Payer: Managed Care, Other (non HMO) | Admitting: Podiatry

## 2018-09-03 VITALS — Temp 97.3°F

## 2018-09-03 DIAGNOSIS — M7672 Peroneal tendinitis, left leg: Secondary | ICD-10-CM

## 2018-09-03 DIAGNOSIS — M79672 Pain in left foot: Secondary | ICD-10-CM

## 2018-09-03 NOTE — Progress Notes (Signed)
Subjective:   Patient ID: Gregory Marshall, male   DOB: 67 y.o.   MRN: [id number]   HPI Patient presents stating he has been having some pain in the outside of his left foot for around 6 months and does not remember injury.  States it is sore with shoe gear and he has been treated for prostate cancer recently   ROS      Objective:  Physical Exam  Scaler status intact with patient found to have inflammation base of fifth metatarsal left with fluid buildup at the peroneal insertion and no indication of muscle strength loss     Assessment:  Appears to be peroneal tendinitis left with inflammation     Plan:  H&P condition reviewed at today I did do a sheath injection of the peroneal 3 mg Kenalog 5 mg Xylocaine and advised on ice therapy and reappoint to recheck  X-ray indicates that the patient does have some reactivity around the base the fifth metatarsal-localized

## 2018-09-21 NOTE — Progress Notes (Signed)
  Radiation Oncology         [phone] ________________________________  Name: Corvin Bogus MRN: [id number]  Date: 09/01/2018  DOB: 11/02/1950  End of Treatment Note  Diagnosis:   68 y.o. gentleman with Stage T2b adenocarcinoma of the prostate with Gleason score of 4+4, and PSA of 7.6     Indication for treatment:  Curative, Definitive Radiotherapy       Radiation treatment dates:   6/29-09/01/18  Site/dose:  1. Radioactive seeds were implanted into the prostate for a total of 110 Gy. 2. The prostate, seminal vesicles, and pelvic lymph nodes were boosted with 45 Gy in 25 fractions of 1.8 Gy, for a total dose of 155 Gy  Beams/energy:  1. The radioactive seeds were delivered under sagittal guidance with 3D imaging. 2. The prostate, seminal vesicles, and pelvic lymph nodes were initially treated using helical intensity modulated radiotherapy delivering 6 megavolt photons. Image guidance was performed with megavoltage CT studies prior to each fraction. He was immobilized with a body fix lower extremity mold.   Narrative: The patient tolerated radiation treatment relatively well. The patient experienced some minor urinary irritation and modest fatigue.  He had increased obstructive symptoms and nocturia during radiation.  Plan: The patient has completed radiation treatment. He will return to radiation oncology clinic for routine followup in one month. I advised him to call or return sooner if he has any questions or concerns related to his recovery or treatment. ________________________________  Sheral Apley. Tammi Klippel, M.D.

## 2018-10-08 ENCOUNTER — Ambulatory Visit
Admission: RE | Admit: 2018-10-08 | Discharge: 2018-10-08 | Disposition: A | Discharge status: Home / Self Care | Payer: Managed Care, Other (non HMO) | Source: Ambulatory Visit | Attending: Radiation Oncology | Admitting: Radiation Oncology

## 2018-10-08 ENCOUNTER — Other Ambulatory Visit: Payer: Self-pay

## 2018-10-08 DIAGNOSIS — C61 Malignant neoplasm of prostate: Secondary | ICD-10-CM | POA: Insufficient documentation

## 2018-10-08 DIAGNOSIS — Z510 Encounter for antineoplastic radiation therapy: Secondary | ICD-10-CM | POA: Insufficient documentation

## 2018-10-08 NOTE — Progress Notes (Signed)
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 07/07/18:  Radioactive seeds were implanted into the prostate for a total of 110 Gy. 2. 07/28/18-09/01/18:  The prostate, seminal vesicles, and pelvic lymph nodes were boosted with 45 Gy in 25 fractions of 1.8 Gy, for a total dose of 155 Gy  Narrative: I spoke with the patient to conduct his routine scheduled 1 month follow up visit via telephone to spare the patient unnecessary potential exposure in the healthcare setting during the current COVID-19 pandemic.  The patient was notified in advance and gave permission to proceed with this visit format. He tolerated radiation treatment relatively well with minor urinary irritation and modest fatigue.  He had increased obstructive symptoms and nocturia during radiation but did not require medications.  He also experienced occasional episodes of loose stools which were managed with imodium prn.                        On review of systems, the patient states that he is doing well overall.  He has noticed gradual improvement in his LUTS, currently with only mild occasional dysuria but continues with increased frequency, urgency and nocturia x3-4 per night.  He denies gross hematuria, weak stream or incomplete bladder emptying.  He has a prescription for Flomax but has not started taking this as he was not sure if this was for the dysuria or to help with his urgency/frequency.  He denies any abdominal pain, nausea, vomiting, diarrhea or constipation.  He reports a healthy appetite and is maintaining his weight.  He continues with mild fatigue but feels that this is  gradually improving.  He continues to tolerate the ADT well and is unsure when he is due for his next injection.  ALLERGIES:  has No Known Allergies.  Meds: Current Outpatient Medications  Medication Sig Dispense Refill  . amLODipine (NORVASC) 2.5 MG tablet Take 2.5 mg by mouth daily.     Marland Kitchen docusate sodium (COLACE) 100 MG capsule Take 100 mg by mouth daily as needed.     . Ergocalciferol (VITAMIN D2 PO) Vitamin D2  1 tab weekly    . HYDROcodone-acetaminophen (NORCO) 10-325 MG tablet Take 1-2 tablets by mouth every 4 (four) hours as needed for moderate pain. Maximum dose per 24 hours - 8 pills 10 tablet 0  . rosuvastatin (CRESTOR) 10 MG tablet Take 10 mg by mouth at bedtime.     No current facility-administered medications for this encounter.     Physical Findings:  vitals were not taken for this visit.   /10 Unable to assess due to telephone follow up visit format.  Lab Findings: Lab Results  Component Value Date   WBC 5.0 07/03/2018   HGB 13.0 07/03/2018   HCT 40.0 07/03/2018   MCV 87.3 07/03/2018   PLT 238 07/03/2018     Radiographic Findings: No results found.  Impression/Plan: 1. 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 He will continue to follow up with urology for ongoing PSA determinations.  He does not currently  have any scheduled follow-up appointments with Dr. Karsten Ro to his knowledge.  He anticipates completing a 2-year course of ADT and is unsure when he is due for his next injection.  He received his first Lupron injection in January 2020 but does not recall having any further since that time.  He understands what to expect with regards to PSA monitoring going forward. I will look forward to following his response to treatment via correspondence with urology, and would be happy to continue to participate in his care if clinically indicated. I talked to the patient about what to expect in the future,  including his risk for erectile dysfunction and rectal bleeding. I encouraged him to call or return to the office if he has any questions regarding his previous radiation or possible radiation side effects. He was comfortable with this plan and will follow up as needed.      Nicholos Johns, PA-C

## 2018-10-13 ENCOUNTER — Other Ambulatory Visit: Payer: Self-pay | Admitting: Neurosurgery

## 2018-10-27 NOTE — Progress Notes (Signed)
[id number] [address], [place] [address] [place] [address] [phone] [phone]  [facility] [place], [place] [address] [place] [address] Phone: [phone] Fax: [phone]      Your procedure is scheduled on Thursday, October 1st, 2020.  Report to [facility] Main Entrance "A" at 5:30 A.M., and check in at the Admitting office.   Call this number if you have problems the morning of surgery:  [phone]  Call [phone] if you have any questions prior to your surgery date Monday-Friday 8am-4pm    Remember:  Do not eat or drink after midnight the night before your surgery    Take these medicines the morning of surgery with A SIP OF WATER: Amlodipine (Norvasc) Gabapentin (Neurontin)   7 days prior to surgery STOP taking any Aspirin (unless otherwise instructed by your surgeon), Aleve, Naproxen, Ibuprofen, Motrin, Advil, Goody's, BC's, all herbal medications, fish oil, and all vitamins.    The Morning of Surgery  Do not wear jewelry, make-up or nail polish.  Do not wear lotions, powders, or perfumes/colognes, or deodorant  Do not shave 48 hours prior to surgery.  Men may shave face and neck.  Do not bring valuables to the hospital.  [facility] is not responsible for any belongings or valuables.  If you are a smoker, DO NOT Smoke 24 hours prior to surgery IF you wear a CPAP at night please bring your mask, tubing, and machine the morning of surgery   Remember that you must have someone to transport you home after your surgery, and remain with you for 24 hours if you are discharged the same day.   Contacts, glasses, hearing aids, dentures or bridgework may not be worn into surgery.    Leave your suitcase in the car.  After surgery it may be brought to your room.  For patients admitted to the hospital, discharge time will be determined by your treatment  team.  Patients discharged the day of surgery will not be allowed to drive home.    Special instructions:   [facility]- Preparing For Surgery  Before surgery, you can play an important role. Because skin is not sterile, your skin needs to be as free of germs as possible. You can reduce the number of germs on your skin by washing with CHG (chlorahexidine gluconate) Soap before surgery.  CHG is an antiseptic cleaner which kills germs and bonds with the skin to continue killing germs even after washing.    Oral Hygiene is also important to reduce your risk of infection.  Remember - BRUSH YOUR TEETH THE MORNING OF SURGERY WITH YOUR REGULAR TOOTHPASTE  Please do not use if you have an allergy to CHG or antibacterial soaps. If your skin becomes reddened/irritated stop using the CHG.  Do not shave (including legs and underarms) for at least 48 hours prior to first CHG shower. It is OK to shave your face.  Please follow these instructions carefully.   1. Shower the NIGHT BEFORE SURGERY and the MORNING OF SURGERY with CHG Soap.   2. If you chose to wash your hair, wash your hair first as usual with your normal shampoo.  3. After you shampoo, rinse your hair and body thoroughly to remove the shampoo.  4. Use CHG as you would any other liquid soap. You can apply CHG directly to the skin and wash gently with a scrungie or  a clean washcloth.   5. Apply the CHG Soap to your body ONLY FROM THE NECK DOWN.  Do not use on open wounds or open sores. Avoid contact with your eyes, ears, mouth and genitals (private parts). Wash Face and genitals (private parts)  with your normal soap.   6. Wash thoroughly, paying special attention to the area where your surgery will be performed.  7. Thoroughly rinse your body with warm water from the neck down.  8. DO NOT shower/wash with your normal soap after using and rinsing off the CHG Soap.  9. Pat yourself dry with a CLEAN TOWEL.  10. Wear CLEAN PAJAMAS to bed  the night before surgery, wear comfortable clothes the morning of surgery  11. Place CLEAN SHEETS on your bed the night of your first shower and DO NOT SLEEP WITH PETS.    Day of Surgery:  Do not apply any deodorants/lotions. Please shower the morning of surgery with the CHG soap  Please wear clean clothes to the hospital/surgery center.   Remember to brush your teeth WITH YOUR REGULAR TOOTHPASTE.   Please read over the following fact sheets that you were given.

## 2018-10-28 ENCOUNTER — Other Ambulatory Visit: Payer: Self-pay

## 2018-10-28 ENCOUNTER — Other Ambulatory Visit (HOSPITAL_COMMUNITY)
Admission: RE | Admit: 2018-10-28 | Discharge: 2018-10-28 | Disposition: A | Discharge status: Home / Self Care | Payer: Managed Care, Other (non HMO) | Source: Ambulatory Visit | Attending: Neurosurgery | Admitting: Neurosurgery

## 2018-10-28 ENCOUNTER — Encounter (HOSPITAL_COMMUNITY): Payer: Self-pay

## 2018-10-28 ENCOUNTER — Encounter (HOSPITAL_COMMUNITY)
Admission: RE | Admit: 2018-10-28 | Discharge: 2018-10-28 | Disposition: A | Discharge status: Home / Self Care | Payer: Managed Care, Other (non HMO) | Source: Ambulatory Visit | Attending: Neurosurgery | Admitting: Neurosurgery

## 2018-10-28 LAB — CBC
HCT: 41.3 % (ref 39.0–52.0)
Hemoglobin: 13.7 g/dL (ref 13.0–17.0)
MCH: 29.7 pg (ref 26.0–34.0)
MCHC: 33.2 g/dL (ref 30.0–36.0)
MCV: 89.4 fL (ref 80.0–100.0)
Platelets: 250 10*3/uL (ref 150–400)
RBC: 4.62 MIL/uL (ref 4.22–5.81)
RDW: 13 % (ref 11.5–15.5)
WBC: 4.5 10*3/uL (ref 4.0–10.5)
nRBC: 0 % (ref 0.0–0.2)

## 2018-10-28 LAB — BASIC METABOLIC PANEL
Anion gap: 9 (ref 5–15)
BUN: 13 mg/dL (ref 8–23)
CO2: 25 mmol/L (ref 22–32)
Calcium: 9.2 mg/dL (ref 8.9–10.3)
Chloride: 102 mmol/L (ref 98–111)
Creatinine, Ser: 1 mg/dL (ref 0.61–1.24)
GFR calc Af Amer: >60 mL/min (ref >60)
GFR calc non Af Amer: >60 mL/min (ref >60)
Glucose, Bld: 131 mg/dL — ABNORMAL HIGH (ref 70–99)
Potassium: 4.3 mmol/L (ref 3.5–5.1)
Sodium: 136 mmol/L (ref 135–145)

## 2018-10-28 LAB — SARS CORONAVIRUS 2 (TAT 6-24 HRS): SARS Coronavirus 2: NEGATIVE

## 2018-10-28 LAB — SURGICAL PCR SCREEN
MRSA, PCR: NEGATIVE
Staphylococcus aureus: NEGATIVE

## 2018-10-28 LAB — TYPE AND SCREEN
ABO/RH(D): O POS
Antibody Screen: NEGATIVE

## 2018-10-28 LAB — ABO/RH: ABO/RH(D): O POS

## 2018-10-28 MED ORDER — CHLORHEXIDINE GLUCONATE CLOTH 2 % EX PADS: 6.0000 | MEDICATED_PAD | Freq: Once | CUTANEOUS | Status: DC

## 2018-10-28 NOTE — Progress Notes (Signed)
PCP - DR Marcie Bal Cardiologist - NA     Rd  Chest x-ray - 2/20 EKG - 3/20 Stress Test - NA ECHO3/20-  Cardiac Cath -''years ago' PT CANT REMEMBER    F-    s: Aspirin Instructions:STOP   -   COVID TEST- TODAY   Anesthesia review: EKG     Patient denies shortness of breath, fever, cough and chest pain at PAT appointment   Patient verbalized understanding of instructions that were given to them at the PAT appointment. Patient was also instructed that they will need to review over the PAT instructions again at home before surgery.

## 2018-10-29 ENCOUNTER — Other Ambulatory Visit: Payer: Self-pay | Admitting: Neurosurgery

## 2018-10-29 NOTE — Anesthesia Preprocedure Evaluation (Addendum)
Anesthesia Evaluation  Patient identified by MRN, date of birth, ID band Patient awake    Reviewed: Allergy & Precautions, NPO status   History of Anesthesia Complications (+) PONV  Airway Mallampati: II  TM Distance: >3 FB     Dental   Pulmonary    breath sounds clear to auscultation       Cardiovascular hypertension,  Rhythm:Regular Rate:Normal     Neuro/Psych    GI/Hepatic negative GI ROS, Neg liver ROS,   Endo/Other    Renal/GU Renal disease     Musculoskeletal   Abdominal   Peds  Hematology   Anesthesia Other Findings   Reproductive/Obstetrics                          Anesthesia Physical Anesthesia Plan  ASA: II  Anesthesia Plan: General   Post-op Pain Management:    Induction: Intravenous  PONV Risk Score and Plan:   Airway Management Planned: Oral ETT  Additional Equipment:   Intra-op Plan:   Post-operative Plan:   Informed Consent: I have reviewed the patients History and Physical, chart, labs and discussed the procedure including the risks, benefits and alternatives for the proposed anesthesia with the patient or authorized representative who has indicated his/her understanding and acceptance.     Dental advisory given  Plan Discussed with: CRNA and Anesthesiologist  Anesthesia Plan Comments: (Hx of abnormal EKG. Seen by cardiologist, Dr. Vernell Leep on 04/07/2018 for eval.  Per his note, "The stress ECG was borderline positive in the anterolateral leads. However, the echo images were normal at rest and with stress so no evidence for ischemia. Good exercise tolerance with no chest pain. I suspect that this is a normal study."  Echocardiogram 04/17/2018: Left ventricle cavity is normal in size. Moderate concentric hypertrophy of the left ventricle. Hyperdynamic global wall motion. Doppler evidence of grade I (impaired) diastolic dysfunction, normal LAP.  Calculated EF 64%. Left atrial cavity is moderately dilated. Mild tricuspid regurgitation. Estimated pulmonary artery systolic pressure 26 mmHg. IVC is dilated with respiratory variation. Estimated RA pressure 8 mmHg.  )     Anesthesia Quick Evaluation

## 2018-10-30 ENCOUNTER — Inpatient Hospital Stay (HOSPITAL_COMMUNITY): Payer: Managed Care, Other (non HMO) | Admitting: Certified Registered"

## 2018-10-30 ENCOUNTER — Encounter (HOSPITAL_COMMUNITY): Payer: Self-pay | Admitting: *Deleted

## 2018-10-30 ENCOUNTER — Encounter (HOSPITAL_COMMUNITY)
Admission: RE | Disposition: A | Discharge status: Home / Self Care | Payer: Self-pay | Source: Home / Self Care | Attending: Neurosurgery

## 2018-10-30 ENCOUNTER — Inpatient Hospital Stay (HOSPITAL_COMMUNITY): Payer: Managed Care, Other (non HMO) | Admitting: Physician Assistant

## 2018-10-30 ENCOUNTER — Other Ambulatory Visit: Payer: Self-pay

## 2018-10-30 ENCOUNTER — Inpatient Hospital Stay (HOSPITAL_COMMUNITY): Payer: Managed Care, Other (non HMO)

## 2018-10-30 ENCOUNTER — Inpatient Hospital Stay (HOSPITAL_COMMUNITY)
Admission: RE | Admit: 2018-10-30 | Discharge: 2018-10-31 | DRG: 455 | Disposition: A | Discharge status: Home / Self Care | Payer: Managed Care, Other (non HMO) | Attending: Neurosurgery | Admitting: Neurosurgery

## 2018-10-30 DIAGNOSIS — M48062 Spinal stenosis, lumbar region with neurogenic claudication: Principal | ICD-10-CM | POA: Diagnosis present

## 2018-10-30 DIAGNOSIS — M5116 Intervertebral disc disorders with radiculopathy, lumbar region: Secondary | ICD-10-CM | POA: Diagnosis present

## 2018-10-30 DIAGNOSIS — C61 Malignant neoplasm of prostate: Secondary | ICD-10-CM | POA: Diagnosis present

## 2018-10-30 DIAGNOSIS — R7303 Prediabetes: Secondary | ICD-10-CM | POA: Diagnosis present

## 2018-10-30 DIAGNOSIS — E782 Mixed hyperlipidemia: Secondary | ICD-10-CM | POA: Diagnosis present

## 2018-10-30 DIAGNOSIS — Z79899 Other long term (current) drug therapy: Secondary | ICD-10-CM | POA: Diagnosis not present

## 2018-10-30 DIAGNOSIS — I10 Essential (primary) hypertension: Secondary | ICD-10-CM | POA: Diagnosis present

## 2018-10-30 DIAGNOSIS — M4316 Spondylolisthesis, lumbar region: Secondary | ICD-10-CM | POA: Diagnosis present

## 2018-10-30 DIAGNOSIS — Z419 Encounter for procedure for purposes other than remedying health state, unspecified: Secondary | ICD-10-CM

## 2018-10-30 DIAGNOSIS — M545 Low back pain: Secondary | ICD-10-CM | POA: Diagnosis present

## 2018-10-30 DIAGNOSIS — Z20828 Contact with and (suspected) exposure to other viral communicable diseases: Secondary | ICD-10-CM | POA: Diagnosis present

## 2018-10-30 SURGERY — POSTERIOR LUMBAR FUSION 2 LEVEL
Anesthesia: General | Site: Back

## 2018-10-30 MED ORDER — MIDAZOLAM HCL 2 MG/2ML IJ SOLN
INTRAMUSCULAR | Status: AC
  Filled 2018-10-30: qty 2

## 2018-10-30 MED ORDER — ROCURONIUM BROMIDE 10 MG/ML (PF) SYRINGE
PREFILLED_SYRINGE | INTRAVENOUS | Status: AC
  Filled 2018-10-30: qty 10

## 2018-10-30 MED ORDER — KETOROLAC TROMETHAMINE 15 MG/ML IJ SOLN
INTRAMUSCULAR | Status: AC
  Filled 2018-10-30: qty 1

## 2018-10-30 MED ORDER — ROSUVASTATIN CALCIUM 5 MG PO TABS
10.0000 mg | ORAL_TABLET | Freq: Every day | ORAL | Status: DC
  Administered 2018-10-30: 20:00:00 10 mg via ORAL
  Filled 2018-10-30: qty 2

## 2018-10-30 MED ORDER — LACTATED RINGERS IV SOLN
INTRAVENOUS | Status: DC | PRN
  Administered 2018-10-30: 07:00:00 via INTRAVENOUS

## 2018-10-30 MED ORDER — 0.9 % SODIUM CHLORIDE (POUR BTL) OPTIME
TOPICAL | Status: DC | PRN
  Administered 2018-10-30 (×5): 1000 mL

## 2018-10-30 MED ORDER — SODIUM CHLORIDE 0.9 % IV SOLN
INTRAVENOUS | Status: DC | PRN
  Administered 2018-10-30: 500 mL

## 2018-10-30 MED ORDER — ONDANSETRON HCL 4 MG PO TABS: 4.0000 mg | ORAL_TABLET | Freq: Four times a day (QID) | ORAL | Status: DC | PRN

## 2018-10-30 MED ORDER — THROMBIN 20000 UNITS EX SOLR
CUTANEOUS | Status: DC | PRN
  Administered 2018-10-30: 20 mL

## 2018-10-30 MED ORDER — METHYLPREDNISOLONE ACETATE 80 MG/ML IJ SUSP
INTRAMUSCULAR | Status: AC
  Filled 2018-10-30: qty 1

## 2018-10-30 MED ORDER — ROCURONIUM BROMIDE 10 MG/ML (PF) SYRINGE
PREFILLED_SYRINGE | INTRAVENOUS | Status: AC
  Filled 2018-10-30: qty 20

## 2018-10-30 MED ORDER — LIDOCAINE 2% (20 MG/ML) 5 ML SYRINGE
INTRAMUSCULAR | Status: DC | PRN
  Administered 2018-10-30: 100 mg via INTRAVENOUS

## 2018-10-30 MED ORDER — PROMETHAZINE HCL 25 MG/ML IJ SOLN
6.2500 mg | INTRAMUSCULAR | Status: AC | PRN
  Administered 2018-10-30 (×2): 6.25 mg via INTRAVENOUS

## 2018-10-30 MED ORDER — ACETAMINOPHEN 325 MG PO TABS: 650.0000 mg | ORAL_TABLET | ORAL | Status: DC | PRN

## 2018-10-30 MED ORDER — SODIUM CHLORIDE 0.9% FLUSH
3.0000 mL | Freq: Two times a day (BID) | INTRAVENOUS | Status: DC
  Administered 2018-10-30: 3 mL via INTRAVENOUS

## 2018-10-30 MED ORDER — FENTANYL CITRATE (PF) 250 MCG/5ML IJ SOLN
INTRAMUSCULAR | Status: AC
  Filled 2018-10-30: qty 5

## 2018-10-30 MED ORDER — CEFAZOLIN SODIUM-DEXTROSE 2-4 GM/100ML-% IV SOLN
INTRAVENOUS | Status: AC
  Filled 2018-10-30: qty 100

## 2018-10-30 MED ORDER — LACTATED RINGERS IV SOLN
INTRAVENOUS | Status: DC | PRN
  Administered 2018-10-30 (×2): via INTRAVENOUS

## 2018-10-30 MED ORDER — LIDOCAINE-EPINEPHRINE 1 %-1:100000 IJ SOLN
INTRAMUSCULAR | Status: DC | PRN
  Administered 2018-10-30: 13 mL

## 2018-10-30 MED ORDER — HYDROMORPHONE HCL 1 MG/ML IJ SOLN
INTRAMUSCULAR | Status: AC
  Filled 2018-10-30: qty 1

## 2018-10-30 MED ORDER — PROPOFOL 10 MG/ML IV BOLUS
INTRAVENOUS | Status: DC | PRN
  Administered 2018-10-30: 150 mg via INTRAVENOUS

## 2018-10-30 MED ORDER — ONDANSETRON HCL 4 MG/2ML IJ SOLN: 4.0000 mg | Freq: Four times a day (QID) | INTRAMUSCULAR | Status: DC | PRN

## 2018-10-30 MED ORDER — THROMBIN 5000 UNITS EX SOLR
OROMUCOSAL | Status: DC | PRN
  Administered 2018-10-30: 5 mL

## 2018-10-30 MED ORDER — MENTHOL 3 MG MT LOZG: 1.0000 | LOZENGE | OROMUCOSAL | Status: DC | PRN

## 2018-10-30 MED ORDER — ONDANSETRON HCL 4 MG/2ML IJ SOLN
INTRAMUSCULAR | Status: AC
  Filled 2018-10-30: qty 2

## 2018-10-30 MED ORDER — HYDROMORPHONE HCL 1 MG/ML IJ SOLN
0.2500 mg | INTRAMUSCULAR | Status: DC | PRN
  Administered 2018-10-30: 0.25 mg via INTRAVENOUS

## 2018-10-30 MED ORDER — KCL IN DEXTROSE-NACL 20-5-0.45 MEQ/L-%-% IV SOLN: INTRAVENOUS | Status: DC

## 2018-10-30 MED ORDER — ACETAMINOPHEN 10 MG/ML IV SOLN
1000.0000 mg | Freq: Once | INTRAVENOUS | Status: AC
  Administered 2018-10-30: 15:00:00 1000 mg via INTRAVENOUS

## 2018-10-30 MED ORDER — KETOROLAC TROMETHAMINE 30 MG/ML IJ SOLN
INTRAMUSCULAR | Status: AC
  Filled 2018-10-30: qty 1

## 2018-10-30 MED ORDER — ALUM & MAG HYDROXIDE-SIMETH 200-200-20 MG/5ML PO SUSP: 30.0000 mL | Freq: Four times a day (QID) | ORAL | Status: DC | PRN

## 2018-10-30 MED ORDER — CEFAZOLIN SODIUM-DEXTROSE 2-4 GM/100ML-% IV SOLN
2.0000 g | INTRAVENOUS | Status: AC
  Administered 2018-10-30: 08:00:00 2 g via INTRAVENOUS

## 2018-10-30 MED ORDER — THROMBIN 5000 UNITS EX SOLR
CUTANEOUS | Status: AC
  Filled 2018-10-30: qty 5000

## 2018-10-30 MED ORDER — PROMETHAZINE HCL 25 MG/ML IJ SOLN
INTRAMUSCULAR | Status: AC
  Filled 2018-10-30: qty 1

## 2018-10-30 MED ORDER — GABAPENTIN 100 MG PO CAPS
100.0000 mg | ORAL_CAPSULE | Freq: Three times a day (TID) | ORAL | Status: DC
  Administered 2018-10-30 – 2018-10-31 (×2): 100 mg via ORAL
  Filled 2018-10-30 (×2): qty 1

## 2018-10-30 MED ORDER — KETOROLAC TROMETHAMINE 30 MG/ML IJ SOLN
15.0000 mg | Freq: Once | INTRAMUSCULAR | Status: AC
  Administered 2018-10-30: 15 mg via INTRAVENOUS

## 2018-10-30 MED ORDER — DEXAMETHASONE SODIUM PHOSPHATE 10 MG/ML IJ SOLN
INTRAMUSCULAR | Status: DC | PRN
  Administered 2018-10-30: 4 mg via INTRAVENOUS

## 2018-10-30 MED ORDER — HYDROXYZINE HCL 25 MG PO TABS: 50.0000 mg | ORAL_TABLET | ORAL | Status: DC | PRN

## 2018-10-30 MED ORDER — ONDANSETRON HCL 4 MG/2ML IJ SOLN
INTRAMUSCULAR | Status: DC | PRN
  Administered 2018-10-30: 4 mg via INTRAVENOUS

## 2018-10-30 MED ORDER — CYCLOBENZAPRINE HCL 5 MG PO TABS: 5.0000 mg | ORAL_TABLET | Freq: Three times a day (TID) | ORAL | Status: DC | PRN

## 2018-10-30 MED ORDER — PHENOL 1.4 % MT LIQD: 1.0000 | OROMUCOSAL | Status: DC | PRN

## 2018-10-30 MED ORDER — KETOROLAC TROMETHAMINE 30 MG/ML IJ SOLN
15.0000 mg | Freq: Four times a day (QID) | INTRAMUSCULAR | Status: DC
  Administered 2018-10-30 – 2018-10-31 (×4): 15 mg via INTRAVENOUS
  Filled 2018-10-30 (×4): qty 1

## 2018-10-30 MED ORDER — BISACODYL 10 MG RE SUPP: 10.0000 mg | Freq: Every day | RECTAL | Status: DC | PRN

## 2018-10-30 MED ORDER — LIDOCAINE-EPINEPHRINE 1 %-1:100000 IJ SOLN
INTRAMUSCULAR | Status: AC
  Filled 2018-10-30: qty 1

## 2018-10-30 MED ORDER — ROCURONIUM BROMIDE 10 MG/ML (PF) SYRINGE
PREFILLED_SYRINGE | INTRAVENOUS | Status: DC | PRN
  Administered 2018-10-30: 50 mg via INTRAVENOUS
  Administered 2018-10-30 (×4): 20 mg via INTRAVENOUS

## 2018-10-30 MED ORDER — AMLODIPINE BESYLATE 2.5 MG PO TABS
2.5000 mg | ORAL_TABLET | Freq: Every day | ORAL | Status: DC
  Administered 2018-10-31: 10:00:00 2.5 mg via ORAL
  Filled 2018-10-30 (×2): qty 1

## 2018-10-30 MED ORDER — FERROUS SULFATE 325 (65 FE) MG PO TABS
325.0000 mg | ORAL_TABLET | Freq: Every day | ORAL | Status: DC
  Administered 2018-10-31: 325 mg via ORAL
  Filled 2018-10-30: qty 1

## 2018-10-30 MED ORDER — PROPOFOL 10 MG/ML IV BOLUS
INTRAVENOUS | Status: AC
  Filled 2018-10-30: qty 40

## 2018-10-30 MED ORDER — ACETAMINOPHEN 10 MG/ML IV SOLN
INTRAVENOUS | Status: AC
  Filled 2018-10-30: qty 100

## 2018-10-30 MED ORDER — MAGNESIUM HYDROXIDE 400 MG/5ML PO SUSP: 30.0000 mL | Freq: Every day | ORAL | Status: DC | PRN

## 2018-10-30 MED ORDER — BUPIVACAINE HCL (PF) 0.5 % IJ SOLN
INTRAMUSCULAR | Status: AC
  Filled 2018-10-30: qty 30

## 2018-10-30 MED ORDER — PHENYLEPHRINE 40 MCG/ML (10ML) SYRINGE FOR IV PUSH (FOR BLOOD PRESSURE SUPPORT)
PREFILLED_SYRINGE | INTRAVENOUS | Status: DC | PRN
  Administered 2018-10-30: 40 ug via INTRAVENOUS
  Administered 2018-10-30: 80 ug via INTRAVENOUS
  Administered 2018-10-30: 40 ug via INTRAVENOUS

## 2018-10-30 MED ORDER — SUGAMMADEX SODIUM 200 MG/2ML IV SOLN
INTRAVENOUS | Status: DC | PRN
  Administered 2018-10-30: 180 mg via INTRAVENOUS

## 2018-10-30 MED ORDER — SODIUM CHLORIDE 0.9% FLUSH: 3.0000 mL | INTRAVENOUS | Status: DC | PRN

## 2018-10-30 MED ORDER — MORPHINE SULFATE (PF) 4 MG/ML IV SOLN: 4.0000 mg | INTRAVENOUS | Status: DC | PRN

## 2018-10-30 MED ORDER — HYDROCODONE-ACETAMINOPHEN 5-325 MG PO TABS
1.0000 | ORAL_TABLET | ORAL | Status: DC | PRN
  Administered 2018-10-30: 1 via ORAL
  Administered 2018-10-31: 10:00:00 2 via ORAL
  Administered 2018-10-31 (×2): 1 via ORAL
  Filled 2018-10-30 (×5): qty 1

## 2018-10-30 MED ORDER — BUPIVACAINE HCL (PF) 0.5 % IJ SOLN
INTRAMUSCULAR | Status: DC | PRN
  Administered 2018-10-30: 13 mL

## 2018-10-30 MED ORDER — HYDROXYZINE HCL 50 MG/ML IM SOLN: 50.0000 mg | INTRAMUSCULAR | Status: DC | PRN

## 2018-10-30 MED ORDER — FENTANYL CITRATE (PF) 250 MCG/5ML IJ SOLN
INTRAMUSCULAR | Status: DC | PRN
  Administered 2018-10-30 (×3): 25 ug via INTRAVENOUS
  Administered 2018-10-30: 50 ug via INTRAVENOUS
  Administered 2018-10-30: 100 ug via INTRAVENOUS
  Administered 2018-10-30: 25 ug via INTRAVENOUS
  Administered 2018-10-30 (×3): 50 ug via INTRAVENOUS
  Administered 2018-10-30: 25 ug via INTRAVENOUS

## 2018-10-30 MED ORDER — THROMBIN 20000 UNITS EX SOLR
CUTANEOUS | Status: AC
  Filled 2018-10-30: qty 20000

## 2018-10-30 MED ORDER — ACETAMINOPHEN 650 MG RE SUPP: 650.0000 mg | RECTAL | Status: DC | PRN

## 2018-10-30 MED ORDER — CEFAZOLIN SODIUM-DEXTROSE 2-3 GM-%(50ML) IV SOLR
INTRAVENOUS | Status: DC | PRN
  Administered 2018-10-30: 2 g via INTRAVENOUS

## 2018-10-30 MED ORDER — DEXAMETHASONE SODIUM PHOSPHATE 10 MG/ML IJ SOLN
INTRAMUSCULAR | Status: AC
  Filled 2018-10-30: qty 1

## 2018-10-30 MED ORDER — ONDANSETRON HCL 4 MG/2ML IJ SOLN
4.0000 mg | Freq: Once | INTRAMUSCULAR | Status: AC
  Administered 2018-10-30: 4 mg via INTRAVENOUS

## 2018-10-30 MED ORDER — LIDOCAINE 2% (20 MG/ML) 5 ML SYRINGE
INTRAMUSCULAR | Status: AC
  Filled 2018-10-30: qty 5

## 2018-10-30 MED ORDER — SUCCINYLCHOLINE CHLORIDE 20 MG/ML IJ SOLN
INTRAMUSCULAR | Status: DC | PRN
  Administered 2018-10-30: 100 mg via INTRAVENOUS

## 2018-10-30 MED ORDER — MIDAZOLAM HCL 5 MG/5ML IJ SOLN
INTRAMUSCULAR | Status: DC | PRN
  Administered 2018-10-30: 2 mg via INTRAVENOUS

## 2018-10-30 MED ORDER — FLEET ENEMA 7-19 GM/118ML RE ENEM: 1.0000 | ENEMA | Freq: Once | RECTAL | Status: DC | PRN

## 2018-10-30 SURGICAL SUPPLY — 79 items
BAG DECANTER FOR FLEXI CONT (MISCELLANEOUS) ×2 IMPLANT
BENZOIN TINCTURE PRP APPL 2/3 (GAUZE/BANDAGES/DRESSINGS) IMPLANT
BLADE CLIPPER SURG (BLADE) IMPLANT
BUR ACRON 5.0MM COATED (BURR) ×4 IMPLANT
BUR MATCHSTICK NEURO 3.0 LAGG (BURR) ×2 IMPLANT
CAGE POST LUM 11X23X9 6D (Cage) ×4 IMPLANT
CAGE TRITAN 8X23X6D (Cage) ×4 IMPLANT
CANISTER SUCT 3000ML PPV (MISCELLANEOUS) ×2 IMPLANT
CAP LCK SPNE (Orthopedic Implant) ×6 IMPLANT
CAP LOCK SPINE RADIUS (Orthopedic Implant) ×6 IMPLANT
CAP LOCKING (Orthopedic Implant) ×6 IMPLANT
CARTRIDGE OIL MAESTRO DRILL (MISCELLANEOUS) ×1 IMPLANT
CONT SPEC 4OZ CLIKSEAL STRL BL (MISCELLANEOUS) ×2 IMPLANT
COVER BACK TABLE 60X90IN (DRAPES) ×2 IMPLANT
COVER WAND RF STERILE (DRAPES) ×2 IMPLANT
DECANTER SPIKE VIAL GLASS SM (MISCELLANEOUS) ×2 IMPLANT
DERMABOND ADVANCED (GAUZE/BANDAGES/DRESSINGS) ×1
DERMABOND ADVANCED .7 DNX12 (GAUZE/BANDAGES/DRESSINGS) ×1 IMPLANT
DIFFUSER DRILL AIR PNEUMATIC (MISCELLANEOUS) ×2 IMPLANT
DRAPE C-ARM 42X72 X-RAY (DRAPES) ×4 IMPLANT
DRAPE C-ARMOR (DRAPES) ×2 IMPLANT
DRAPE HALF SHEET 40X57 (DRAPES) IMPLANT
DRAPE LAPAROTOMY 100X72X124 (DRAPES) ×2 IMPLANT
DRAPE POUCH INSTRU U-SHP 10X18 (DRAPES) ×2 IMPLANT
ELECT BLADE 4.0 EZ CLEAN MEGAD (MISCELLANEOUS) ×2
ELECT REM PT RETURN 9FT ADLT (ELECTROSURGICAL) ×2
ELECTRODE BLDE 4.0 EZ CLN MEGD (MISCELLANEOUS) ×1 IMPLANT
ELECTRODE REM PT RTRN 9FT ADLT (ELECTROSURGICAL) ×1 IMPLANT
GAUZE 4X4 16PLY RFD (DISPOSABLE) IMPLANT
GAUZE SPONGE 4X4 12PLY STRL (GAUZE/BANDAGES/DRESSINGS) ×2 IMPLANT
GLOVE BIOGEL PI IND STRL 7.0 (GLOVE) ×1 IMPLANT
GLOVE BIOGEL PI IND STRL 8 (GLOVE) ×2 IMPLANT
GLOVE BIOGEL PI INDICATOR 7.0 (GLOVE) ×1
GLOVE BIOGEL PI INDICATOR 8 (GLOVE) ×2
GLOVE ECLIPSE 7.5 STRL STRAW (GLOVE) ×4 IMPLANT
GLOVE SURG SS PI 6.5 STRL IVOR (GLOVE) ×2 IMPLANT
GOWN STRL REUS W/ TWL LRG LVL3 (GOWN DISPOSABLE) ×1 IMPLANT
GOWN STRL REUS W/ TWL XL LVL3 (GOWN DISPOSABLE) ×2 IMPLANT
GOWN STRL REUS W/TWL 2XL LVL3 (GOWN DISPOSABLE) IMPLANT
GOWN STRL REUS W/TWL LRG LVL3 (GOWN DISPOSABLE) ×1
GOWN STRL REUS W/TWL XL LVL3 (GOWN DISPOSABLE) ×2
HEMOSTAT POWDER KIT SURGIFOAM (HEMOSTASIS) ×2 IMPLANT
KIT BASIN OR (CUSTOM PROCEDURE TRAY) ×2 IMPLANT
KIT TURNOVER KIT B (KITS) ×2 IMPLANT
NEEDLE 18GX1X1/2 (RX/OR ONLY) (NEEDLE) ×2 IMPLANT
NEEDLE ASP BONE MRW 8GX15 (NEEDLE) ×2 IMPLANT
NEEDLE SPNL 18GX3.5 QUINCKE PK (NEEDLE) ×6 IMPLANT
NEEDLE SPNL 22GX3.5 QUINCKE BK (NEEDLE) ×2 IMPLANT
NS IRRIG 1000ML POUR BTL (IV SOLUTION) ×10 IMPLANT
OIL CARTRIDGE MAESTRO DRILL (MISCELLANEOUS) ×2
PACK LAMINECTOMY NEURO (CUSTOM PROCEDURE TRAY) ×2 IMPLANT
PAD ARMBOARD 7.5X6 YLW CONV (MISCELLANEOUS) ×6 IMPLANT
PATTIES SURGICAL .5 X.5 (GAUZE/BANDAGES/DRESSINGS) IMPLANT
PATTIES SURGICAL .5 X1 (DISPOSABLE) IMPLANT
PATTIES SURGICAL 1X1 (DISPOSABLE) ×4 IMPLANT
ROD 50MM (Rod) ×2 IMPLANT
ROD SPNL 50X5.5XNS TI RDS (Rod) ×2 IMPLANT
SCREW 5.75X40M (Screw) ×2 IMPLANT
SCREW 5.75X45MM (Screw) ×8 IMPLANT
SCREW 5.75X50MM (Screw) ×4 IMPLANT
SPONGE LAP 4X18 RFD (DISPOSABLE) IMPLANT
SPONGE NEURO XRAY DETECT 1X3 (DISPOSABLE) ×6 IMPLANT
SPONGE SURGIFOAM ABS GEL 100 (HEMOSTASIS) IMPLANT
STAPLER SKIN PROX WIDE 3.9 (STAPLE) IMPLANT
STRIP BIOACTIVE VITOSS 25X100X (Neuro Prosthesis/Implant) ×6 IMPLANT
STRIP CLOSURE SKIN 1/2X4 (GAUZE/BANDAGES/DRESSINGS) IMPLANT
SUT PROLENE 6 0 BV (SUTURE) IMPLANT
SUT VIC AB 1 CT1 18XBRD ANBCTR (SUTURE) ×3 IMPLANT
SUT VIC AB 1 CT1 8-18 (SUTURE) ×3
SUT VIC AB 2-0 CP2 18 (SUTURE) ×4 IMPLANT
SYR 3ML LL SCALE MARK (SYRINGE) IMPLANT
SYR 5ML LL (SYRINGE) IMPLANT
SYR CONTROL 10ML LL (SYRINGE) ×2 IMPLANT
TAPE CLOTH SURG 4X10 WHT LF (GAUZE/BANDAGES/DRESSINGS) ×2 IMPLANT
TOWEL GREEN STERILE (TOWEL DISPOSABLE) ×2 IMPLANT
TOWEL GREEN STERILE FF (TOWEL DISPOSABLE) ×2 IMPLANT
TRAP SPECIMEN MUCOUS 40CC (MISCELLANEOUS) IMPLANT
TRAY FOLEY MTR SLVR 16FR STAT (SET/KITS/TRAYS/PACK) ×2 IMPLANT
WATER STERILE IRR 1000ML POUR (IV SOLUTION) ×2 IMPLANT

## 2018-10-30 NOTE — Op Note (Signed)
10/30/2018  2:53 PM  PATIENT:  Gregory Marshall  68 y.o. 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 spondylolisthesis; lumbar spondylosis; lumbar degenerative disc disease  PROCEDURE:  Procedure(s): L3-L5 decompressive lumbar laminectomy with bilateral L3-4 and L4-5 facetectomies, with bilateral L3, L4, and L5 foraminotomies for decompression of canal, lateral recess, and 3 level neuroforaminal stenosis with decompression beyond that required for interbody arthrodesis; bilateral L3-4 and L4-5 posterior lumbar interbody arthrodesis with Tritanium interbody implants and Vitoss BA with bone marrow aspirate; L3-L5 posterior lateral arthrodesis with segmental radius posterior instrumentation and Vitoss BA with bone marrow aspirate  SURGEON:  Surgeon(s): Jovita Gamma, MD  ANESTHESIA:   general  EBL:  Total I/O In: 2300 [I.V.:2100; Blood:200] Out: 1225 [Urine:725; Blood:500]  BLOOD ADMINISTERED:200 CC CELLSAVER  COUNT: Correct per nursing staff  DICTATION: Patient was brought to the operating room placed under general endotracheal anesthesia. The patient was turned to prone position, the lumbar region was prepped with Betadine soap and solution and draped in a sterile fashion. The midline was infiltrated with local anesthesia with epinephrine. A localizing x-ray was taken and the L3-L5 levels were identified.  Midline incision was made through the previous midline incision, and extended rostrally and caudally.  It was carried down through the subcutaneous tissue, bipolar cautery and electrocautery were used to  maintain hemostasis. Dissection was carried down to the lumbar fascia. The fascia was incised bilaterally and the paraspinal muscles were dissected with a spinous process and lamina in a subperiosteal fashion. Another x-ray was taken for localization and the L3-4 and L4-5 levels were localized. Dissection was then carried out laterally over the facet complexes and the transverse processes of L3, L4, and L5 were exposed and decorticated.  We then proceeded with the decompression.  An L3, L4, and L5 laminectomy was performed using double-action rongeurs, the high-speed drill and Kerrison punches. Dissection was carried out laterally including facetectomy and foraminotomies with decompression of the stenotic compression of the exiting L3, L4, and L5 nerve roots bilaterally. Once the decompression of the stenotic compression of the thecal sac and exiting nerve roots was completed we proceeded with the posterior lumbar interbody arthrodesis. The annulus at each level was incised bilaterally and the disc space entered. A thorough discectomy was performed using pituitary rongeurs and curettes. Once the discectomy was completed we began to prepare the endplate surfaces, removing the cartilaginous endplates surface. We then measured the height of the intervertebral disc space. We selected 10 x 23 x 6 x 9 Tritanium interbody implants for the L3-4 level, and 8 x 23 x 6 x 9 Tritanium interbody implants for the L4-5 level.  The C-arm fluoroscope was then draped and brought in the field and we identified the pedicle entry points bilaterally at the L3, L4, and L5 levels. Each of the 6 pedicles was probed, we aspirated bone marrow aspirate from the vertebral bodies, this was injected over three 10 cc strips of Vitoss BA.  Each of the pedicles was examined with the ball probe, good bony surfaces were found and no bony cuts were found. Each of the pedicles was then tapped with a 5.25 mm tap, again examined with the ball probe  good threading was found and no bony cuts were  found. We then placed 5.75 by 40 millimeter screw on the left at L3 and a 5.75 by 45 mm on the right at L3, 5.75 by 45 millimeter screws bilaterally at the L4 level, and 5.75 by 50 millimeter screws bilaterally at the L5 level.  We then packed the Tritanium interbody implants with Vitoss BA with bone marrow aspirate, and then placed the first implant at the L4-5 level on the right side, carefully retracting the thecal sac and nerve root medially. We then went back to the left side and packed the midline with additional Vitoss BA with bone marrow aspirate and then placed a second implant on the left side again retracting the thecal sac and nerve root medially. Then at the L3-4 level, we placed the first implant on the right side, carefully retracting the thecal sac and nerve root medially. We then went back to the left side and packed the midline with additional Vitoss BA with bone marrow aspirate and then placed a second implant on the left side, again retracting the thecal sac and nerve root medially.   We then packed the lateral gutter over the transverse processes and intertransverse space with Vitoss BA with bone marrow aspirate. We then selected 50 mm pre-lordosis rods, they were placed within the screw heads and secured with locking caps once all 6 locking caps were placed final tightening was performed against a counter torque.  The wound had been irrigated multiple times during the procedure with saline solution and bacitracin solution, good hemostasis was established with a combination of bipolar cautery and Gelfoam with thrombin. Once good hemostasis was confirmed we proceeded with closure.  In separate layers the paraspinal muscles deep fascia and Scarpa's fascia were closed with interrupted undyed 1 Vicryl sutures the subcutaneous and subcuticular closed with interrupted inverted 2-0 undyed Vicryl sutures the skin edges were approximated with  Dermabond.  There was dressed with sterile gauze and Hypafix.  Following surgery the patient was turned back to the supine position to be reversed and the anesthetic extubated and transferred to the recovery room for further care.   PLAN OF CARE: Admit to inpatient   PATIENT DISPOSITION:  PACU - hemodynamically stable.   Delay start of Pharmacological VTE agent (>24hrs) due to surgical blood loss or risk of bleeding:  yes

## 2018-10-30 NOTE — Progress Notes (Signed)
Orthopedic Tech Progress Note Patient Details:  Gregory Marshall 21-Feb-1950 [id number] Patient was supplied with brace before surgery  Patient ID: Gregory Marshall, male   DOB: 10/11/1950, 68 y.o.   MRN: [id number]   Gregory Marshall 10/30/2018, 3:11 PM

## 2018-10-30 NOTE — Progress Notes (Signed)
Vitals:   10/30/18 1551 10/30/18 1553 10/30/18 1625 10/30/18 1644  BP: (!) 126/101 118/61 115/73 131/69  Pulse: 69 67 75 75  Resp: 17 18 16 16   Temp:    97.6 F (36.4 C)  TempSrc:    Oral  SpO2: 98% 96% 97% 100%    CBC Recent Labs    10/28/18 0949  WBC 4.5  HGB 13.7  HCT 41.3  PLT 250   BMET Recent Labs    10/28/18 0949  NA 136  K 4.3  CL 102  CO2 25  GLUCOSE 131*  BUN 13  CREATININE 1.00  CALCIUM 9.2    Patient resting in bed.  Comfortable.  Dressing clean and dry.  Has not yet ambulated.  Plan: To begin ambulation in the halls this evening, and continue to progress.  Will progress through postoperative recovery.  Will DC Foley once up and ambulating.  Hosie Spangle, MD 10/30/2018, 6:55 PM'

## 2018-10-30 NOTE — Anesthesia Procedure Notes (Signed)
Procedure Name: Intubation Date/Time: 10/30/2018 7:37 AM Performed by: Amadeo Garnet, CRNA Pre-anesthesia Checklist: Patient identified, Emergency Drugs available, Suction available, Patient being monitored and Timeout performed Patient Re-evaluated:Patient Re-evaluated prior to induction Oxygen Delivery Method: Circle system utilized Preoxygenation: Pre-oxygenation with 100% oxygen Induction Type: IV induction Ventilation: Mask ventilation without difficulty Laryngoscope Size: Mac and 3 Grade View: Grade I Tube type: Oral Tube size: 7.5 mm Number of attempts: 1 Airway Equipment and Method: Stylet Secured at: 22 cm Tube secured with: Tape Dental Injury: Teeth and Oropharynx as per pre-operative assessment

## 2018-10-30 NOTE — Discharge Instructions (Signed)
°  Call Your Doctor If Any of These Occur °Redness, drainage, or swelling at the wound.  °Temperature greater than 101 degrees. °Severe pain not relieved by pain medication. °Incision starts to come apart. °Follow Up Appt °Call today for appointment in 3 weeks [phone]) or for problems.  If you have any hardware placed in your spine, you will need an x-ray before your appointment. °

## 2018-10-30 NOTE — Anesthesia Postprocedure Evaluation (Signed)
Anesthesia Post Note  Patient: Gregory Marshall  Procedure(s) Performed: LUMBAR THREE- LUMBAR FIVE DECOMPRESSION, LUMBAR THREE- LUMBAR FOUR, LUMBAR FOUR- LUMBAR FIVE POSTERIOR LUMBAR INTERBODY FUSION, LUMBAR THREE- LUMBAR FIVE POSTERIOR LATERAL ARTHRODESIS (N/A Back)     Patient location during evaluation: PACU Anesthesia Type: General Level of consciousness: awake Pain management: pain level controlled Vital Signs Assessment: post-procedure vital signs reviewed and stable Respiratory status: spontaneous breathing Cardiovascular status: stable Postop Assessment: no apparent nausea or vomiting Anesthetic complications: no    Last Vitals:  Vitals:   10/30/18 1551 10/30/18 1553  BP: (!) 126/101 118/61  Pulse: 69 67  Resp: 17 18  Temp:    SpO2: 98% 96%    Last Pain:  Vitals:   10/30/18 1551  TempSrc:   PainSc: 6                  Jemuel Laursen

## 2018-10-30 NOTE — H&P (Signed)
Subjective: Patient is a 68 y.o. male who is admitted for treatment of multilevel, multifactorial lumbar stenosis at the L3-4 and L4-5 levels with associated neurogenic claudication and lumbar radiculopathy.  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  Patient's worst pain is through the left lower extremity into the anterior left thigh and knee but also into the left buttock and low back.  He is comfortable with sitting, but pain develops with standing and activity.  His MRI scan shows overall canal stenosis but particular neuroforaminal stenosis to the left.  He is admitted now for a L3-L5 decompressive lumbar laminectomy, bilateral facetectomies, and foraminotomies, and stabilization via a L3-4 and L4-5 posterior lumbar interbody arthrodesis with interbody implants and bone graft and a bilateral L3-L5 posterior lateral arthrodesis with posterior instrumentation and bone graft.   Patient Active Problem List   Diagnosis Date Noted  . Cataract 06/20/2018  . Diverticulosis of colon 06/20/2018  . Essential hypertension 04/07/2018  . Carcinoma of prostate ([place]) 03/01/2018  . Malignant neoplasm of prostate ([place]) 02/28/2018  . Fecal occult blood test positive 03/11/2017  . Anal fissure 03/11/2017  . Constipation 03/11/2017  . Hypertensive left ventricular hypertrophy 03/11/2017  . Shoulder pain 03/11/2017  . Obesity 05/21/2016  . Prediabetes 05/20/2016  . Vitamin D deficiency 05/20/2016  . Primary erectile dysfunction 01/18/2016  . Palpitations 12/24/2012  . Left knee pain 08/05/2012  . Sciatica 06/11/2012  . Abnormal ECG 02/28/2009  . Mixed hyperlipidemia 02/24/2009   Past Medical History:  Diagnosis Date  . Arthritis    shoulders  . Full dentures   . History of kidney stones   . Hyperlipidemia   . Hypertension   . Prostate cancer ([place])   . Renal calculus, left   . Vertigo    hx of, x 1 - resolved    Past Surgical History:  Procedure Laterality Date  .  CARDIAC CATHETERIZATION    . CERVICAL FUSION  1998  . COLONOSCOPY  2008  . LUMBAR LAMINECTOMY/DECOMPRESSION MICRODISCECTOMY Left 06/21/2012   Procedure: LUMBAR LAMINECTOMY/DECOMPRESSION MICRODISCECTOMY 1 LEVEL;  Surgeon: Hosie Spangle, MD;  Location: [place] [facility];  Service: Neurosurgery;  Laterality: Left;  LEFT L3-4 extraforaminal microdiskectomy  . MULTIPLE TOOTH EXTRACTIONS     full dentures  . RADIOACTIVE SEED IMPLANT N/A 07/07/2018   Procedure: RADIOACTIVE SEED IMPLANT/BRACHYTHERAPY IMPLANT;  Surgeon: Kathie Rhodes, MD;  Location: [facility];  Service: Urology;  Laterality: N/A;  . SPACE OAR INSTILLATION N/A 07/07/2018   Procedure: SPACE OAR INSTILLATION;  Surgeon: Kathie Rhodes, MD;  Location: [facility];  Service: Urology;  Laterality: N/A;    Medications Prior to Admission  Medication Sig Dispense Refill Last Dose  . amLODipine (NORVASC) 2.5 MG tablet Take 2.5 mg by mouth daily.    10/29/2018 at Unknown time  . ferrous sulfate 325 (65 FE) MG tablet Take 325 mg by mouth daily with breakfast.   10/29/2018 at Unknown time  . gabapentin (NEURONTIN) 100 MG capsule Take 100 mg by mouth 3 (three) times daily.   10/29/2018 at Unknown time  . rosuvastatin (CRESTOR) 10 MG tablet Take 10 mg by mouth at bedtime.   10/29/2018 at Unknown time   No Known Allergies  Social History   Tobacco Use  . Smoking status: Never Smoker  . Smokeless tobacco: Never Used  Substance Use Topics  . Alcohol use: Not Currently    Alcohol/week: 0.0 standard drinks    Family History  Problem Relation Age of  Onset  . Alzheimer's disease Mother   . Heart disease Brother 47       CABG  . Prostate cancer Maternal Uncle   . Prostate cancer Cousin        paternal  . Prostate cancer Cousin        paternal  . Colon cancer Neg Hx   . Colon polyps Neg Hx   . Rectal cancer Neg Hx   . Stomach cancer Neg Hx      Review of Systems Pertinent items noted in HPI and remainder of  comprehensive ROS otherwise negative.  Objective: Vital signs in last 24 hours: Temp:  [98.4 F (36.9 C)] 98.4 F (36.9 C) (10/01 0541) Pulse Rate:  [71] 71 (10/01 0541) Resp:  [18] 18 (10/01 0541) BP: (148)/(71) 148/71 (10/01 0541) SpO2:  [98 %] 98 % (10/01 0541)  EXAM: Patient is well-developed well-nourished male in no acute distress.   Lungs are clear to auscultation , the patient has symmetrical respiratory excursion. Heart has a regular rate and rhythm normal S1 and S2 no murmur.   Abdomen is soft nontender nondistended bowel sounds are present. Extremity examination shows no clubbing cyanosis or edema. Motor examination shows 5 over 5 strength in the lower extremities including the iliopsoas quadriceps dorsiflexor extensor hallicus  longus and plantar flexor bilaterally. Sensation is intact to pinprick in the distal lower extremities. Reflexes are symmetrical bilaterally. No pathologic reflexes are present. Patient has a normal gait and stance.  Data Review:CBC    Component Value Date/Time   WBC 4.5 10/28/2018 0949   RBC 4.62 10/28/2018 0949   HGB 13.7 10/28/2018 0949   HCT 41.3 10/28/2018 0949   PLT 250 10/28/2018 0949   MCV 89.4 10/28/2018 0949   MCV 90.4 10/03/2012 1651   MCH 29.7 10/28/2018 0949   MCHC 33.2 10/28/2018 0949   RDW 13.0 10/28/2018 0949                          BMET    Component Value Date/Time   NA 136 10/28/2018 0949   K 4.3 10/28/2018 0949   CL 102 10/28/2018 0949   CO2 25 10/28/2018 0949   GLUCOSE 131 (H) 10/28/2018 0949   BUN 13 10/28/2018 0949   CREATININE 1.00 10/28/2018 0949   CREATININE 1.66 (H) 10/08/2012 0957   CALCIUM 9.2 10/28/2018 0949   GFRNONAA >60 10/28/2018 0949   GFRAA >60 10/28/2018 0949     Assessment/Plan: Patient with lumbar stenosis and neurogenic claudication who is admitted for lumbar decompression and stabilization.  I've discussed with the patient the nature of his condition, the nature the surgical procedure, the  typical length of surgery, hospital stay, and overall recuperation, the limitations postoperatively, and risks of surgery. I discussed risks including risks of infection, bleeding, possibly need for transfusion, the risk of nerve root dysfunction with pain, weakness, numbness, or paresthesias, the risk of dural tear and CSF leakage and possible need for further surgery, the risk of failure of the arthrodesis and possibly for further surgery, the risk of anesthetic complications including myocardial infarction, stroke, pneumonia, and death. We discussed the need for postoperative immobilization in a lumbar brace. Understanding all this the patient does wish to proceed with surgery and is admitted for such.   Hosie Spangle, MD 10/30/2018 7:07 AM

## 2018-10-30 NOTE — Transfer of Care (Signed)
Immediate Anesthesia Transfer of Care Note  Patient: Lawerance Cruel  Procedure(s) Performed: LUMBAR THREE- LUMBAR FIVE DECOMPRESSION, LUMBAR THREE- LUMBAR FOUR, LUMBAR FOUR- LUMBAR FIVE POSTERIOR LUMBAR INTERBODY FUSION, LUMBAR THREE- LUMBAR FIVE POSTERIOR LATERAL ARTHRODESIS (N/A Back)  Patient Location: PACU  Anesthesia Type:General  Level of Consciousness: drowsy and patient cooperative  Airway & Oxygen Therapy: Patient Spontanous Breathing  Post-op Assessment: Report given to RN and Post -op Vital signs reviewed and stable  Post vital signs: Reviewed and stable  Last Vitals:  Vitals Value Taken Time  BP 127/75 10/30/18 1506  Temp    Pulse 74 10/30/18 1507  Resp 18 10/30/18 1507  SpO2 98 % 10/30/18 1507  Vitals shown include unvalidated device data.  Last Pain:  Vitals:   10/30/18 0603  TempSrc:   PainSc: 0-No pain         Complications: No apparent anesthesia complications

## 2018-10-31 MED ORDER — HYDROCODONE-ACETAMINOPHEN 5-325 MG PO TABS: 1.0000 | ORAL_TABLET | ORAL | 0 refills | Status: DC | PRN

## 2018-10-31 MED FILL — Sodium Chloride IV Soln 0.9%: INTRAVENOUS | Qty: 1000 | Status: AC

## 2018-10-31 MED FILL — Heparin Sodium (Porcine) Inj 1000 Unit/ML: INTRAMUSCULAR | Qty: 30 | Status: AC

## 2018-10-31 MED FILL — Sodium Chloride Irrigation Soln 0.9%: Qty: 3000 | Status: AC

## 2018-10-31 NOTE — Plan of Care (Signed)
Patient alert and oriented, mae's well, voiding adequate amount of urine, swallowing without difficulty, no c/o pain at time of discharge. Patient discharged home with family. Script and discharged instructions given to patient. Patient and family stated understanding of instructions given. Patient has an appointment with Dr. Nudelman 

## 2018-10-31 NOTE — Discharge Summary (Signed)
Physician Discharge Summary  Patient ID: Gregory Marshall MRN: [id number] DOB/AGE: [birth year] 68 y.o.  Admit date: 10/30/2018 Discharge date: 10/31/2018  Admission Diagnoses:  L3-4 and L4-5 multilevel, multifactorial lumbar stenosis with neurogenic claudication and left lumbar radiculopathy; L3-4 grade 1-2 dynamic degenerative spondylolisthesis; lumbar spondylosis; lumbar degenerative disc disease  Discharge Diagnoses:  L3-4 and L4-5 multilevel, multifactorial lumbar stenosis with neurogenic claudication and left lumbar radiculopathy; L3-4 grade 1-2 dynamic degenerative spondylolisthesis; lumbar spondylosis; lumbar degenerative disc disease Active Problems:   Lumbar stenosis with neurogenic claudication   Discharged Condition: good  Hospital Course: Patient was admitted, underwent an L3-L5 lumbar decompression, PLIF, and PLA.  He has done well following surgery.  He is up and ambulating actively in the halls.  He is voiding well.  His incision is clean and dry.  He and his wife have been given instructions regarding wound care and activities following discharge.  He is scheduled to follow-up with me in the office with x-rays in about 3 weeks.  Discharge Exam: Blood pressure 98/62, pulse 78, temperature 99 F (37.2 C), temperature source Oral, resp. rate 18, SpO2 99 %.  Disposition: Discharge disposition: 01-Home or Self Care       Discharge Instructions    Discharge wound care:   Complete by: As directed    Leave the wound open to air. Shower daily with the wound uncovered. Water and soapy water should run over the incision area. Do not wash directly on the incision for 2 weeks. Remove the glue after 2 weeks.   Driving Restrictions   Complete by: As directed    No driving for 2 weeks. May ride in the car locally now. May begin to drive locally in 2 weeks.   Other Restrictions   Complete by: As directed    Walk gradually increasing distances out in the fresh air at least twice a day.  Walking additional 6 times inside the house, gradually increasing distances, daily. No bending, lifting, or twisting. Perform activities between shoulder and waist height (that is at counter height when standing or table height when sitting).     Allergies as of 10/31/2018   No Known Allergies     Medication List    TAKE these medications   amLODipine 2.5 MG tablet Commonly known as: NORVASC Take 2.5 mg by mouth daily.   ferrous sulfate 325 (65 FE) MG tablet Take 325 mg by mouth daily with breakfast.   gabapentin 100 MG capsule Commonly known as: NEURONTIN Take 100 mg by mouth 3 (three) times daily.   HYDROcodone-acetaminophen 5-325 MG tablet Commonly known as: NORCO/VICODIN Take 1-2 tablets by mouth every 4 (four) hours as needed (pain).   rosuvastatin 10 MG tablet Commonly known as: CRESTOR Take 10 mg by mouth at bedtime.            Discharge Care Instructions  (From admission, onward)         Start     Ordered   10/31/18 0000  Discharge wound care:    Comments: Leave the wound open to air. Shower daily with the wound uncovered. Water and soapy water should run over the incision area. Do not wash directly on the incision for 2 weeks. Remove the glue after 2 weeks.   10/31/18 0859         Follow-up Information    Jovita Gamma, MD Follow up.   Specialty: Neurosurgery Contact information: [address] [place] [place] [address] [phone]  SignedHosie Spangle 10/31/2018, 8:59 AM

## 2018-12-11 ENCOUNTER — Other Ambulatory Visit: Payer: Self-pay

## 2018-12-11 ENCOUNTER — Ambulatory Visit: Payer: Managed Care, Other (non HMO) | Attending: Neurosurgery | Admitting: Physical Therapy

## 2018-12-11 ENCOUNTER — Encounter: Payer: Self-pay | Admitting: Physical Therapy

## 2018-12-11 DIAGNOSIS — M6281 Muscle weakness (generalized): Secondary | ICD-10-CM | POA: Diagnosis present

## 2018-12-11 DIAGNOSIS — R262 Difficulty in walking, not elsewhere classified: Secondary | ICD-10-CM | POA: Diagnosis present

## 2018-12-11 DIAGNOSIS — M545 Low back pain, unspecified: Secondary | ICD-10-CM

## 2018-12-11 NOTE — Therapy (Signed)
[facility] [address] [place], [place], [address] Phone: [phone]   Fax:  [phone]  Physical Therapy Evaluation  Patient Details  Name: Gregory Marshall MRN: [id number] Date of Birth: 10/16/1950 Referring Provider (PT): Dr. Sherwood Gambler    Encounter Date: 12/11/2018  PT End of Session - 12/11/18 1947    Visit Number  1    Date for PT Re-Evaluation  02/05/19    Authorization Type  [employer]; Medicare:  10th visit progress note;  KX at visit 15    PT Start Time  1230    PT Stop Time  1314    PT Time Calculation (min)  44 min    Activity Tolerance  Patient tolerated treatment well       Past Medical History:  Diagnosis Date  . Arthritis    shoulders  . Full dentures   . History of kidney stones   . Hyperlipidemia   . Hypertension   . Prostate cancer ([place])   . Renal calculus, left   . Vertigo    hx of, x 1 - resolved    Past Surgical History:  Procedure Laterality Date  . CARDIAC CATHETERIZATION    . CERVICAL FUSION  1998  . COLONOSCOPY  2008  . LUMBAR LAMINECTOMY/DECOMPRESSION MICRODISCECTOMY Left 06/21/2012   Procedure: LUMBAR LAMINECTOMY/DECOMPRESSION MICRODISCECTOMY 1 LEVEL;  Surgeon: Hosie Spangle, MD;  Location: [place] [facility];  Service: Neurosurgery;  Laterality: Left;  LEFT L3-4 extraforaminal microdiskectomy  . MULTIPLE TOOTH EXTRACTIONS     full dentures  . RADIOACTIVE SEED IMPLANT N/A 07/07/2018   Procedure: RADIOACTIVE SEED IMPLANT/BRACHYTHERAPY IMPLANT;  Surgeon: Kathie Rhodes, MD;  Location: [facility];  Service: Urology;  Laterality: N/A;  . SPACE OAR INSTILLATION N/A 07/07/2018   Procedure: SPACE OAR INSTILLATION;  Surgeon: Kathie Rhodes, MD;  Location: [facility];  Service: Urology;  Laterality: N/A;    There were no vitals filed for this visit.   Subjective Assessment - 12/11/18 1234    Subjective  Pre-op had left LBP and left LE sypmptoms.  L3-5  10/1  lumbar fusion "both sides".  Dr. said coming along pretty good.  Pain with walk/stand sharp right side.  No sitting pain.    Pertinent History  3rd back surgery: neck '98, left back '14 DDD;  recent surgery 10/30/2018 L3-5 fusion    Limitations  House hold activities;Walking;Standing    How long can you stand comfortably?  5-10 minutes 7-8/10    How long can you walk comfortably?  pain immediately but can do 15-20 walks with [facility]   7-8/10    Diagnostic tests  x-rays post op "still looking good."    Patient Stated Goals  Healing; get rid of pain in lower right side    Currently in Pain?  Yes    Pain Score  2    walking 7-8   Pain Location  Back    Pain Orientation  Right    Pain Type  Surgical pain    Pain Onset  More than a month ago    Pain Frequency  Constant    Aggravating Factors   walking, standing; lying flat    Pain Relieving Factors  sitting, recliner (sleeps there)         [facility] PT Assessment - 12/11/18 0001      Assessment   Medical Diagnosis  Lumbar fusion/decompression     Referring Provider (PT)  Dr. Sherwood Gambler     Onset  Date/Surgical Date  10/30/18    Next MD Visit  January     Prior Therapy  none      Precautions   Precautions  Back    Precaution Comments  Brace until 12/1     Required Braces or Orthoses  Spinal Brace    Spinal Brace  Thoracolumbosacral orthotic      Restrictions   Weight Bearing Restrictions  No      Balance Screen   Has the patient fallen in the past 6 months  No    Has the patient had a decrease in activity level because of a fear of falling?   No    Is the patient reluctant to leave their home because of a fear of falling?   No      Home Film/video editor residence    Living Arrangements  Spouse/significant other    Home Access  Stairs to enter    Entrance Stairs-Number of Steps  2    [place]  One level      Prior [place]  Other (comment)   hasn't worked since surgery    Vocation Requirements   [employer] lifting,  standing, walking very physical     Leisure  cycling; going out with family       Observation/Other Assessments   Focus on Therapeutic Outcomes (FOTO)   59% limitation       AROM   Overall AROM Comments  lumbar ROM not assessed secondary to surgical precautions for multi level fusion       Strength   Overall Strength Comments  Able to rise from chair without UE use     Right Hip Flexion  4+/5    Right Hip ABduction  3+/5    Left Hip Flexion  4+/5    Left Hip ABduction  3+/5    Right Knee Flexion  5/5    Right Knee Extension  4/5    Left Knee Flexion  5/5    Left Knee Extension  4/5    Right Ankle Dorsiflexion  5/5    Right Ankle Plantar Flexion  4/5    Right Ankle Inversion  5/5    Right Ankle Eversion  5/5    Left Ankle Dorsiflexion  5/5    Left Ankle Plantar Flexion  4/5    Left Ankle Inversion  5/5    Left Ankle Eversion  5/5    Lumbar Flexion  3+/5    Lumbar Extension  3+/5      Palpation   Palpation comment  right QL tightness/spasm       Straight Leg Raise   Findings  Negative    Comment  neural tension but no radiating pain       Ambulation/Gait   Assistive device  Small based quad cane    Gait Comments  decreased gait speed                Objective measurements completed on examination: See above findings.              PT Education - 12/11/18 1945    Education Details  Access Code: [email or web address]   standing heel raises; supine abdominal brace, supine neural glide, sit to stand    Person(s) Educated  Patient    Methods  Explanation;Demonstration;Handout    Comprehension  Returned demonstration;Verbalized understanding       PT Short Term Goals - 12/11/18 2022  PT SHORT TERM GOAL #1   Title  The patient will report understanding of basic self care and ex's to promote post surgical healing    Time  4    Period  Weeks    Status  New    Target Date  01/08/19      PT SHORT TERM GOAL #2   Title  The patient will be  able to walk 15 min with pain 5/10 or less    Time  4    Period  Weeks    Status  New      PT SHORT TERM GOAL #3   Title  The patient will report a 30% improvement in sleep; able to sleep in bed instead of the recliner    Time  4    Period  Weeks    Status  New      PT SHORT TERM GOAL #4   Title  The patient will be able to stand 5-10 minutes with pain level 5/10 or less    Time  4    Period  Weeks    Status  New        PT Long Term Goals - 12/11/18 2028      PT LONG TERM GOAL #1   Title  The patient will be independent with safe self progression of HEP    Time  8    Period  Weeks    Status  New    Target Date  02/05/19      PT LONG TERM GOAL #2   Title  The patient will report an overall improvement in pain and function at 60%    Time  8    Period  Weeks    Status  New      PT LONG TERM GOAL #3   Title  The patient will be able to walk for 20-25 minutes with pain level 4/10 or less    Time  8    Period  Weeks    Status  New      PT LONG TERM GOAL #4   Title  The patient will have lumbo/pelvic/hip core strength grossly 4+/5 needed for stair climbing and standing longer periods of time and return to cycling.    Time  8    Period  Weeks    Status  New      PT LONG TERM GOAL #5   Title  FOTO functional outcome score improved from 59% limitation to 41% indicating improved function with less pain    Time  8    Period  Weeks    Status  New             Plan - 12/11/18 1950    Clinical Impression Statement  The patient underwent multi-level lumbar fusion L3-5 on October 30, 2018.  He presents wearing a lumbar brace ( wear when ambulating until 12/1) and is using a very small based cane.  He reports pre-surgical symptoms were on the left side with left LE symptoms.  He states this has resolved and now he has sharp right low back pain particularly with walking, standing and lying flat.  He has been sleeping in the recliner.  His pain will increase to 7-8/10 with  walking 15 minutes.  Spinal ROM not formally assessed secondary to spinal precautions.  LE strength grossly 4/5 except hip abduction 3+/5. Decreased activation of transverse abdominus.  Negative SLR but neural tension left > right.  Tender  point/spasm right quadratus lumborum and paraspinals.  He is unable to perform his job at [employer] which is very physical.  He is passionate about returning to cycling after he recovers.   He would benefit from PT to address these deficits.    Personal Factors and Comorbidities  Age;Past/Current Experience;Comorbidity 1;Comorbidity 2    Comorbidities  2 previous spinal surgeries: neck in '98, back '14; multi level DDD and stenosis;  HTN; history of shoulder pain    Examination-Activity Limitations  Locomotion Level;Stairs;Stand;Carry;Bend;Sit;Lift;Squat;Sleep    Examination-Participation Restrictions  Community Activity;Other;Meal Prep;Shop    Stability/Clinical Decision Making  Evolving/Moderate complexity    Clinical Decision Making  Moderate    Rehab Potential  Good    PT Frequency  2x / week    PT Duration  8 weeks    PT Treatment/Interventions  ADLs/Self Care Home Management;Electrical Stimulation;Cryotherapy;Moist Heat;Ultrasound;Neuromuscular re-education;Therapeutic exercise;Therapeutic activities;Patient/family education;Manual techniques;Taping;Dry needling    PT Next Visit Plan  Nu-Step or UBE;   progression of abdominal brace and low level lumbo/pelvic/hip core strength in  semi recumbent or sitting if supine too painful;  manual work to right QL;  ES/heat if needed for pain relief    Consulted and Agree with Plan of Care  Patient       Patient will benefit from skilled therapeutic intervention in order to improve the following deficits and impairments:  Difficulty walking, Increased muscle spasms, Increased fascial restricitons, Decreased activity tolerance, Pain, Decreased strength  Visit Diagnosis: Acute right-sided low back pain without sciatica -  Plan: PT plan of care cert/re-cert  Muscle weakness (generalized) - Plan: PT plan of care cert/re-cert  Difficulty in walking, not elsewhere classified - Plan: PT plan of care cert/re-cert     Problem List Patient Active Problem List   Diagnosis Date Noted  . Lumbar stenosis with neurogenic claudication 10/30/2018  . Cataract 06/20/2018  . Diverticulosis of colon 06/20/2018  . Essential hypertension 04/07/2018  . Carcinoma of prostate ([place]) 03/01/2018  . Malignant neoplasm of prostate ([place]) 02/28/2018  . Fecal occult blood test positive 03/11/2017  . Anal fissure 03/11/2017  . Constipation 03/11/2017  . Hypertensive left ventricular hypertrophy 03/11/2017  . Shoulder pain 03/11/2017  . Obesity 05/21/2016  . Prediabetes 05/20/2016  . Vitamin D deficiency 05/20/2016  . Primary erectile dysfunction 01/18/2016  . Palpitations 12/24/2012  . Left knee pain 08/05/2012  . Sciatica 06/11/2012  . Abnormal ECG 02/28/2009  . Mixed hyperlipidemia 02/24/2009   Ruben Im, PT 12/11/18 8:35 PM Phone: [phone] Fax: [phone] Alvera Singh 12/11/2018, 8:35 PM  [facility] [address], [place], [place], [address] Phone: [phone]   Fax:  [phone]  Name: Gregory Marshall MRN: [id number] Date of Birth: 22-Sep-1950

## 2018-12-11 NOTE — Patient Instructions (Signed)
Access Code: [email or web address]  URL: [email or web address]  Date: 12/11/2018  Prepared by: Ruben Im   Exercises  Standing Heel Raise with Support - 10 reps - 1 sets - 1x daily - 7x weekly  Sit to Stand without Arm Support - 10 reps - 1 sets - 1x daily - 7x weekly  Hooklying Transversus Abdominis Palpation - 10 reps - 1 sets - 5 hold - 1x daily - 7x weekly  Supine Sciatic Nerve Glide - 10 reps - 1 sets - 1x daily - 7x weekly

## 2018-12-15 ENCOUNTER — Encounter: Payer: Self-pay | Admitting: Physical Therapy

## 2018-12-15 ENCOUNTER — Other Ambulatory Visit: Payer: Self-pay

## 2018-12-15 ENCOUNTER — Ambulatory Visit: Payer: Managed Care, Other (non HMO) | Admitting: Physical Therapy

## 2018-12-15 DIAGNOSIS — M545 Low back pain, unspecified: Secondary | ICD-10-CM

## 2018-12-15 DIAGNOSIS — M6281 Muscle weakness (generalized): Secondary | ICD-10-CM

## 2018-12-15 DIAGNOSIS — R262 Difficulty in walking, not elsewhere classified: Secondary | ICD-10-CM

## 2018-12-15 NOTE — Patient Instructions (Signed)

## 2018-12-15 NOTE — Therapy (Signed)
[facility] [address], [place], [place], [address] Phone: [phone]   Fax:  [phone]  Physical Therapy Treatment  Patient Details  Name: Gregory Marshall MRN: [id number] Date of Birth: 1950-10-03 Referring Provider (PT): Dr. Sherwood Gambler    Encounter Date: 12/15/2018  PT End of Session - 12/15/18 1143    Visit Number  2    Date for PT Re-Evaluation  02/05/19    Authorization Type  [employer]; Medicare:  10th visit progress note;  KX at visit 15    PT Start Time  1143    PT Stop Time  1229    PT Time Calculation (min)  46 min    Activity Tolerance  Patient tolerated treatment well    Behavior During Therapy  WFL for tasks assessed/performed       Past Medical History:  Diagnosis Date  . Arthritis    shoulders  . Full dentures   . History of kidney stones   . Hyperlipidemia   . Hypertension   . Prostate cancer ([place])   . Renal calculus, left   . Vertigo    hx of, x 1 - resolved    Past Surgical History:  Procedure Laterality Date  . CARDIAC CATHETERIZATION    . CERVICAL FUSION  1998  . COLONOSCOPY  2008  . LUMBAR LAMINECTOMY/DECOMPRESSION MICRODISCECTOMY Left 06/21/2012   Procedure: LUMBAR LAMINECTOMY/DECOMPRESSION MICRODISCECTOMY 1 LEVEL;  Surgeon: Hosie Spangle, MD;  Location: [facility];  Service: Neurosurgery;  Laterality: Left;  LEFT L3-4 extraforaminal microdiskectomy  . MULTIPLE TOOTH EXTRACTIONS     full dentures  . RADIOACTIVE SEED IMPLANT N/A 07/07/2018   Procedure: RADIOACTIVE SEED IMPLANT/BRACHYTHERAPY IMPLANT;  Surgeon: Kathie Rhodes, MD;  Location: [facility];  Service: Urology;  Laterality: N/A;  . SPACE OAR INSTILLATION N/A 07/07/2018   Procedure: SPACE OAR INSTILLATION;  Surgeon: Kathie Rhodes, MD;  Location: [facility];  Service: Urology;  Laterality: N/A;    There were no vitals filed for this visit.  Subjective Assessment - 12/15/18 1148    Subjective   Dong my HEP. Going ok. Still have same pain in my back.    Pertinent History  3rd back surgery: neck '98, left back '14 DDD;  recent surgery 10/30/2018 L3-5 fusion    Currently in Pain?  Yes    Pain Score  5     Pain Location  Back    Pain Orientation  Lower    Pain Descriptors / Indicators  Aching    Aggravating Factors   Walking    Pain Relieving Factors  Sitting    Multiple Pain Sites  No                       [facility] Adult PT Treatment/Exercise - 12/15/18 0001      Lumbar Exercises: Aerobic   Nustep  L1 x 6 min MHP put behind pt.       Lumbar Exercises: Supine   Ab Set  10 reps    Glut Set  10 reps;5 seconds    Clam  10 reps    Heel Slides  5 reps   Bil   Bent Knee Raise  10 reps      Manual Therapy   Manual Therapy  Soft tissue mobilization    Soft tissue mobilization  Combination manual and Addaday assisted soft tissue work to RT QL, proximal glutes and around G trochanter  PT Education - 12/15/18 1159    Education Details  Level 1 core with LE movements added to HEP. Also spoke with pt about our aquatics program and about the benefits of exercising in the water.    Person(s) Educated  Patient    Methods  Explanation;Demonstration;Tactile cues;Verbal cues;Handout    Comprehension  Returned demonstration;Verbalized understanding       PT Short Term Goals - 12/11/18 2022      PT SHORT TERM GOAL #1   Title  The patient will report understanding of basic self care and ex's to promote post surgical healing    Time  4    Period  Weeks    Status  New    Target Date  01/08/19      PT SHORT TERM GOAL #2   Title  The patient will be able to walk 15 min with pain 5/10 or less    Time  4    Period  Weeks    Status  New      PT SHORT TERM GOAL #3   Title  The patient will report a 30% improvement in sleep; able to sleep in bed instead of the recliner    Time  4    Period  Weeks    Status  New      PT SHORT TERM GOAL #4   Title  The  patient will be able to stand 5-10 minutes with pain level 5/10 or less    Time  4    Period  Weeks    Status  New        PT Long Term Goals - 12/11/18 2028      PT LONG TERM GOAL #1   Title  The patient will be independent with safe self progression of HEP    Time  8    Period  Weeks    Status  New    Target Date  02/05/19      PT LONG TERM GOAL #2   Title  The patient will report an overall improvement in pain and function at 60%    Time  8    Period  Weeks    Status  New      PT LONG TERM GOAL #3   Title  The patient will be able to walk for 20-25 minutes with pain level 4/10 or less    Time  8    Period  Weeks    Status  New      PT LONG TERM GOAL #4   Title  The patient will have lumbo/pelvic/hip core strength grossly 4+/5 needed for stair climbing and standing longer periods of time and return to cycling.    Time  8    Period  Weeks    Status  New      PT LONG TERM GOAL #5   Title  FOTO functional outcome score improved from 59% limitation to 41% indicating improved function with less pain    Time  8    Period  Weeks    Status  New            Plan - 12/15/18 1149    Clinical Impression Statement  Pt is compliant and independent in his initial HEP. By adding an audible exhale ( into tthe mask was the cue) pt obtained a really solid core contraction. PTA in fact had to cue pt to lessen the effort some. We added some LE movements to  challenge the core contraction and added it to HEP. Pt could only slide the RT leg out about half way before having pain in his hip & back. He was instructed to either hold on the RT side or keep it very small. Pt had no pain with the Nustep. Pt RT QL was not tender nor did it feel too thickened, but the proximal glutes and around the greater trochanter felt dense. No pain at end of session.    Personal Factors and Comorbidities  Age;Past/Current Experience;Comorbidity 1;Comorbidity 2    Comorbidities  2 previous spinal surgeries:  neck in '98, back '14; multi level DDD and stenosis;  HTN; history of shoulder pain    Examination-Activity Limitations  Locomotion Level;Stairs;Stand;Carry;Bend;Sit;Lift;Squat;Sleep    Examination-Participation Restrictions  Community Activity;Other;Meal Prep;Shop    Stability/Clinical Decision Making  Evolving/Moderate complexity    Rehab Potential  Good    PT Frequency  2x / week    PT Duration  8 weeks    PT Treatment/Interventions  ADLs/Self Care Home Management;Electrical Stimulation;Cryotherapy;Moist Heat;Ultrasound;Neuromuscular re-education;Therapeutic exercise;Therapeutic activities;Patient/family education;Manual techniques;Taping;Dry needling    PT Next Visit Plan  Nustep, pt liked heat to lumbar for supine exercises. Continue with core strength and add hip strength where he score < 4/5 on his MMT on eval. See if pt is interested in the aquatic program.    Consulted and Agree with Plan of Care  Patient       Patient will benefit from skilled therapeutic intervention in order to improve the following deficits and impairments:  Difficulty walking, Increased muscle spasms, Increased fascial restricitons, Decreased activity tolerance, Pain, Decreased strength  Visit Diagnosis: Acute right-sided low back pain without sciatica  Muscle weakness (generalized)  Difficulty in walking, not elsewhere classified     Problem List Patient Active Problem List   Diagnosis Date Noted  . Lumbar stenosis with neurogenic claudication 10/30/2018  . Cataract 06/20/2018  . Diverticulosis of colon 06/20/2018  . Essential hypertension 04/07/2018  . Carcinoma of prostate ([place]) 03/01/2018  . Malignant neoplasm of prostate ([place]) 02/28/2018  . Fecal occult blood test positive 03/11/2017  . Anal fissure 03/11/2017  . Constipation 03/11/2017  . Hypertensive left ventricular hypertrophy 03/11/2017  . Shoulder pain 03/11/2017  . Obesity 05/21/2016  . Prediabetes 05/20/2016  . Vitamin D deficiency  05/20/2016  . Primary erectile dysfunction 01/18/2016  . Palpitations 12/24/2012  . Left knee pain 08/05/2012  . Sciatica 06/11/2012  . Abnormal ECG 02/28/2009  . Mixed hyperlipidemia 02/24/2009    ,, PTA 12/15/2018, 12:43 PM  [facility] [address], [place], [place], [address] Phone: [phone]   Fax:  [phone]  Name: Gregory Marshall MRN: [id number] Date of Birth: April 19, 1950

## 2018-12-19 ENCOUNTER — Encounter: Payer: Self-pay | Admitting: Physical Therapy

## 2018-12-19 ENCOUNTER — Ambulatory Visit: Payer: Managed Care, Other (non HMO) | Admitting: Physical Therapy

## 2018-12-19 ENCOUNTER — Other Ambulatory Visit: Payer: Self-pay

## 2018-12-19 DIAGNOSIS — M545 Low back pain, unspecified: Secondary | ICD-10-CM

## 2018-12-19 DIAGNOSIS — R262 Difficulty in walking, not elsewhere classified: Secondary | ICD-10-CM

## 2018-12-19 DIAGNOSIS — M6281 Muscle weakness (generalized): Secondary | ICD-10-CM

## 2018-12-19 NOTE — Therapy (Addendum)
[facility] [address], [place], [place], [address] Phone: [phone]   Fax:  [phone]  Physical Therapy Treatment  Patient Details  Name: Gregory Marshall MRN: [id number] Date of Birth: [birth year] Referring Provider (PT): Dr. Sherwood Gambler    Encounter Date: 12/19/2018  PT End of Session - 12/19/18 0935    Visit Number  3    Date for PT Re-Evaluation  02/05/19    Authorization Type  [employer]; Medicare:  10th visit progress note;  KX at visit 15    PT Start Time  0932    PT Stop Time  1030    PT Time Calculation (min)  58 min    Activity Tolerance  Patient tolerated treatment well    Behavior During Therapy  WFL for tasks assessed/performed       Past Medical History:  Diagnosis Date  . Arthritis    shoulders  . Full dentures   . History of kidney stones   . Hyperlipidemia   . Hypertension   . Prostate cancer ([place])   . Renal calculus, left   . Vertigo    hx of, x 1 - resolved    Past Surgical History:  Procedure Laterality Date  . CARDIAC CATHETERIZATION    . CERVICAL FUSION  1998  . COLONOSCOPY  2008  . LUMBAR LAMINECTOMY/DECOMPRESSION MICRODISCECTOMY Left 06/21/2012   Procedure: LUMBAR LAMINECTOMY/DECOMPRESSION MICRODISCECTOMY 1 LEVEL;  Surgeon: Hosie Spangle, MD;  Location: [facility];  Service: Neurosurgery;  Laterality: Left;  LEFT L3-4 extraforaminal microdiskectomy  . MULTIPLE TOOTH EXTRACTIONS     full dentures  . RADIOACTIVE SEED IMPLANT N/A 07/07/2018   Procedure: RADIOACTIVE SEED IMPLANT/BRACHYTHERAPY IMPLANT;  Surgeon: Kathie Rhodes, MD;  Location: [facility];  Service: Urology;  Laterality: N/A;  . SPACE OAR INSTILLATION N/A 07/07/2018   Procedure: SPACE OAR INSTILLATION;  Surgeon: Kathie Rhodes, MD;  Location: [facility];  Service: Urology;  Laterality: N/A;    There were no vitals filed for this visit.  Subjective Assessment - 12/19/18 0936    Subjective   I did ok after my last session. I have the normal pain in my back but the last few days the top of my left thigh has been hurting on & off.    Pertinent History  3rd back surgery: neck '98, left back '14 DDD;  recent surgery 10/30/2018 L3-5 fusion    Diagnostic tests  x-rays post op "still looking good."    Currently in Pain?  Yes    Pain Score  5     Pain Location  Back    Pain Orientation  Right;Lower    Pain Descriptors / Indicators  Aching    Multiple Pain Sites  No                       [facility] Adult PT Treatment/Exercise - 12/19/18 0001      Lumbar Exercises: Stretches   Single Knee to Chest Stretch  Right;Left;2 reps;20 seconds    Single Knee to Chest Stretch Limitations  added small knee extensions 5x      Lumbar Exercises: Aerobic   Nustep  L2 x 7 min MHP to lumbar with discussion of current status       Lumbar Exercises: Standing   Heel Raises  10 reps    Heel Raises Limitations  Added to HEP    Other Standing Lumbar Exercises  Hip abduction Bil 10x, added to  HEP      Lumbar Exercises: Supine   Ab Set  --   Review: good demo 6x, VC to hold longer at home.   Glut Set  10 reps;5 seconds    Clam  --   Good retrun demo 10x   Clam Limitations  added red band for hip ER att home 10x    Bent Knee Raise  --   good return demo 10x alternating   Bridge  --   Small ROM 5x VC to contract his gluteals            PT Education - 12/19/18 1003    Education Details  HEP progression: red band to supine clamshells, standing heel raises, hp abduction bil    Person(s) Educated  Patient    Methods  Explanation;Demonstration;Tactile cues;Verbal cues;Handout    Comprehension  Verbalized understanding;Returned demonstration       PT Short Term Goals - 12/11/18 2022      PT SHORT TERM GOAL #1   Title  The patient will report understanding of basic self care and ex's to promote post surgical healing    Time  4    Period  Weeks    Status  New    Target Date   01/08/19      PT SHORT TERM GOAL #2   Title  The patient will be able to walk 15 min with pain 5/10 or less    Time  4    Period  Weeks    Status  New      PT SHORT TERM GOAL #3   Title  The patient will report a 30% improvement in sleep; able to sleep in bed instead of the recliner    Time  4    Period  Weeks    Status  New      PT SHORT TERM GOAL #4   Title  The patient will be able to stand 5-10 minutes with pain level 5/10 or less    Time  4    Period  Weeks    Status  New        PT Long Term Goals - 12/11/18 2028      PT LONG TERM GOAL #1   Title  The patient will be independent with safe self progression of HEP    Time  8    Period  Weeks    Status  New    Target Date  02/05/19      PT LONG TERM GOAL #2   Title  The patient will report an overall improvement in pain and function at 60%    Time  8    Period  Weeks    Status  New      PT LONG TERM GOAL #3   Title  The patient will be able to walk for 20-25 minutes with pain level 4/10 or less    Time  8    Period  Weeks    Status  New      PT LONG TERM GOAL #4   Title  The patient will have lumbo/pelvic/hip core strength grossly 4+/5 needed for stair climbing and standing longer periods of time and return to cycling.    Time  8    Period  Weeks    Status  New      PT LONG TERM GOAL #5   Title  FOTO functional outcome score improved from 59% limitation to 41% indicating improved function  with less pain    Time  8    Period  Weeks    Status  New            Plan - 12/19/18 0935    Clinical Impression Statement  Pt tolerating new HEP well at home. He is compliant and independent. Added some resistance and weightbearing to his hip and back muscles for additional HEP today. He does report a new, intermittent pain in along the anterior medial thigh. This new symptom was not present during our session today. Pt wanted to try Estim at the end of session for his back pain.    Personal Factors and  Comorbidities  Age;Past/Current Experience;Comorbidity 1;Comorbidity 2    Comorbidities  2 previous spinal surgeries: neck in '98, back '14; multi level DDD and stenosis;  HTN; history of shoulder pain    Examination-Activity Limitations  Locomotion Level;Stairs;Stand;Carry;Bend;Sit;Lift;Squat;Sleep    Examination-Participation Restrictions  Community Activity;Other;Meal Prep;Shop    Stability/Clinical Decision Making  Evolving/Moderate complexity    Rehab Potential  Good    PT Frequency  2x / week    PT Duration  8 weeks    PT Treatment/Interventions  ADLs/Self Care Home Management;Electrical Stimulation;Cryotherapy;Moist Heat;Ultrasound;Neuromuscular re-education;Therapeutic exercise;Therapeutic activities;Patient/family education;Manual techniques;Taping;Dry needling    PT Next Visit Plan  Nustep, review new home exercises given today: continue with core, hip strength. Assess benefit of Estim at end of session.    PT Home Exercise Plan  Access Code: [email or web address] and Agree with Plan of Care  Patient       Patient will benefit from skilled therapeutic intervention in order to improve the following deficits and impairments:  Difficulty walking, Increased muscle spasms, Increased fascial restricitons, Decreased activity tolerance, Pain, Decreased strength  Visit Diagnosis: Acute right-sided low back pain without sciatica  Muscle weakness (generalized)  Difficulty in walking, not elsewhere classified     Problem List Patient Active Problem List   Diagnosis Date Noted  . Lumbar stenosis with neurogenic claudication 10/30/2018  . Cataract 06/20/2018  . Diverticulosis of colon 06/20/2018  . Essential hypertension 04/07/2018  . Carcinoma of prostate ([place]) 03/01/2018  . Malignant neoplasm of prostate ([place]) 02/28/2018  . Fecal occult blood test positive 03/11/2017  . Anal fissure 03/11/2017  . Constipation 03/11/2017  . Hypertensive left ventricular hypertrophy 03/11/2017  .  Shoulder pain 03/11/2017  . Obesity 05/21/2016  . Prediabetes 05/20/2016  . Vitamin D deficiency 05/20/2016  . Primary erectile dysfunction 01/18/2016  . Palpitations 12/24/2012  . Left knee pain 08/05/2012  . Sciatica 06/11/2012  . Abnormal ECG 02/28/2009  . Mixed hyperlipidemia 02/24/2009    Latressa Harries, PTA 12/19/2018, 11:11 AM   [facility] [address] [place], [place], [address] Phone: [phone]   Fax:  [phone]  Name: Gregory Marshall MRN: [id number] Date of Birth: 27-Jan-1951  Access Code: [email or web address]  URL: [email or web address]  Date: 12/19/2018  Prepared by: Myrene Galas   Exercises  Standing Heel Raises - 20 reps - 1 sets - 2x daily - 7x weekly  Hooklying Isometric Clamshell - 10 reps - 1 sets - 2x daily - 7x weekly  Standing Hip Abduction - 10 reps - 1 sets - 2x daily - 7x weekly   Addendum: Post Tx estim to lumbar x15 min in hooklying

## 2018-12-22 ENCOUNTER — Other Ambulatory Visit: Payer: Self-pay

## 2018-12-22 ENCOUNTER — Encounter: Payer: Self-pay | Admitting: Physical Therapy

## 2018-12-22 ENCOUNTER — Ambulatory Visit: Payer: Managed Care, Other (non HMO) | Admitting: Physical Therapy

## 2018-12-22 DIAGNOSIS — M545 Low back pain, unspecified: Secondary | ICD-10-CM

## 2018-12-22 DIAGNOSIS — R262 Difficulty in walking, not elsewhere classified: Secondary | ICD-10-CM

## 2018-12-22 DIAGNOSIS — M6281 Muscle weakness (generalized): Secondary | ICD-10-CM

## 2018-12-22 NOTE — Therapy (Signed)
[facility] [address] [place], [place], [address] Phone: [phone]   Fax:  [phone]  Physical Therapy Treatment  Patient Details  Name: Gregory Marshall MRN: [id number] Date of Birth: [birth year] Referring Provider (PT): Dr. Sherwood Gambler    Encounter Date: 12/22/2018  PT End of Session - 12/22/18 1437    Visit Number  4    Date for PT Re-Evaluation  02/05/19    Authorization Type  [employer]; Medicare:  10th visit progress note;  KX at visit 15    PT Start Time  1434    PT Stop Time  1531    PT Time Calculation (min)  57 min    Activity Tolerance  Patient tolerated treatment well    Behavior During Therapy  WFL for tasks assessed/performed       Past Medical History:  Diagnosis Date  . Arthritis    shoulders  . Full dentures   . History of kidney stones   . Hyperlipidemia   . Hypertension   . Prostate cancer ([place])   . Renal calculus, left   . Vertigo    hx of, x 1 - resolved    Past Surgical History:  Procedure Laterality Date  . CARDIAC CATHETERIZATION    . CERVICAL FUSION  1998  . COLONOSCOPY  2008  . LUMBAR LAMINECTOMY/DECOMPRESSION MICRODISCECTOMY Left 06/21/2012   Procedure: LUMBAR LAMINECTOMY/DECOMPRESSION MICRODISCECTOMY 1 LEVEL;  Surgeon: Hosie Spangle, MD;  Location: [facility];  Service: Neurosurgery;  Laterality: Left;  LEFT L3-4 extraforaminal microdiskectomy  . MULTIPLE TOOTH EXTRACTIONS     full dentures  . RADIOACTIVE SEED IMPLANT N/A 07/07/2018   Procedure: RADIOACTIVE SEED IMPLANT/BRACHYTHERAPY IMPLANT;  Surgeon: Kathie Rhodes, MD;  Location: [facility];  Service: Urology;  Laterality: N/A;  . SPACE OAR INSTILLATION N/A 07/07/2018   Procedure: SPACE OAR INSTILLATION;  Surgeon: Kathie Rhodes, MD;  Location: [facility];  Service: Urology;  Laterality: N/A;    There were no vitals filed for this visit.  Subjective Assessment - 12/22/18 1439    Subjective   Went to gym and rode the exercise bike for 30 min. Did fine. I am still having intermittent symptoms in the LT anteriorly but now it is fine.    Pertinent History  3rd back surgery: neck '98, left back '14 DDD;  recent surgery 10/30/2018 L3-5 fusion    Currently in Pain?  Yes    Pain Score  --   7/10 Rt lumbar when standing or walking   Aggravating Factors   walking    Pain Relieving Factors  sitting down no pain.    Multiple Pain Sites  No                       [facility] Adult PT Treatment/Exercise - 12/22/18 0001      Lumbar Exercises: Stretches   Single Knee to Chest Stretch  Right;Left;1 rep;20 seconds    Other Lumbar Stretch Exercise  Supine long leg/leg lengthener stretch 3x 5 sec     Other Lumbar Stretch Exercise  LTLE sciatic n floss 10x.      Lumbar Exercises: Aerobic   Nustep  L3 x 10 min with concurrent discusison about status and pain assessment      Lumbar Exercises: Standing   Heel Raises  20 reps    Other Standing Lumbar Exercises  Hip abduction bil 10x 2# added      Lumbar Exercises: Seated  Other Seated Lumbar Exercises  sitting on black pad: 3# shoulder to shoulder     Other Seated Lumbar Exercises  sittin gon black pad, red band horizontal abduction 10x       Lumbar Exercises: Supine   Clam  15 reps    Clam Limitations  red band    Bent Knee Raise  15 reps    Bridge  10 reps    Bridge Limitations  Demo slightly more ROM      Acupuncturist Location  RT lumbar    Electrical Stimulation Action  IFC to tolerance    Electrical Stimulation Parameters  15 min hooklying    Electrical Stimulation Goals  Pain               PT Short Term Goals - 12/11/18 2022      PT SHORT TERM GOAL #1   Title  The patient will report understanding of basic self care and ex's to promote post surgical healing    Time  4    Period  Weeks    Status  New    Target Date  01/08/19      PT SHORT TERM GOAL #2   Title  The  patient will be able to walk 15 min with pain 5/10 or less    Time  4    Period  Weeks    Status  New      PT SHORT TERM GOAL #3   Title  The patient will report a 30% improvement in sleep; able to sleep in bed instead of the recliner    Time  4    Period  Weeks    Status  New      PT SHORT TERM GOAL #4   Title  The patient will be able to stand 5-10 minutes with pain level 5/10 or less    Time  4    Period  Weeks    Status  New        PT Long Term Goals - 12/11/18 2028      PT LONG TERM GOAL #1   Title  The patient will be independent with safe self progression of HEP    Time  8    Period  Weeks    Status  New    Target Date  02/05/19      PT LONG TERM GOAL #2   Title  The patient will report an overall improvement in pain and function at 60%    Time  8    Period  Weeks    Status  New      PT LONG TERM GOAL #3   Title  The patient will be able to walk for 20-25 minutes with pain level 4/10 or less    Time  8    Period  Weeks    Status  New      PT LONG TERM GOAL #4   Title  The patient will have lumbo/pelvic/hip core strength grossly 4+/5 needed for stair climbing and standing longer periods of time and return to cycling.    Time  8    Period  Weeks    Status  New      PT LONG TERM GOAL #5   Title  FOTO functional outcome score improved from 59% limitation to 41% indicating improved function with less pain    Time  8    Period  Weeks  Status  New            Plan - 12/22/18 1443    Clinical Impression Statement  pt arrives today with initial reports of "doing good." After more conversation he reports he still has intermittent pain in the LT anterior thigh and the Rt lumbar pain continues mainly with walking and standing. He was encouraged to message his MD reagridng the thigh pain if he felt that was necessary. Pt does not have any issues ( except an exercise being hard: bridge) with pain increasing during his session. He thinks the Estim at the end of  his session may have been helpful and wanted to try it again..    Personal Factors and Comorbidities  Age;Past/Current Experience;Comorbidity 1;Comorbidity 2    Comorbidities  2 previous spinal surgeries: neck in '98, back '14; multi level DDD and stenosis;  HTN; history of shoulder pain    Examination-Activity Limitations  Locomotion Level;Stairs;Stand;Carry;Bend;Sit;Lift;Squat;Sleep    Examination-Participation Restrictions  Community Activity;Other;Meal Prep;Shop    Stability/Clinical Decision Making  Evolving/Moderate complexity    Rehab Potential  Good    PT Frequency  2x / week    PT Duration  8 weeks    PT Treatment/Interventions  ADLs/Self Care Home Management;Electrical Stimulation;Cryotherapy;Moist Heat;Ultrasound;Neuromuscular re-education;Therapeutic exercise;Therapeutic activities;Patient/family education;Manual techniques;Taping;Dry needling    PT Next Visit Plan  Nustep,continue with core and hip strength, pt  is doing a little in supine, sitting and standing. Estim if pt feels beneficial.    PT Home Exercise Plan  Access Code: [email or web address]    Consulted and Agree with Plan of Care  Patient       Patient will benefit from skilled therapeutic intervention in order to improve the following deficits and impairments:  Difficulty walking, Increased muscle spasms, Increased fascial restricitons, Decreased activity tolerance, Pain, Decreased strength  Visit Diagnosis: Acute right-sided low back pain without sciatica  Muscle weakness (generalized)  Difficulty in walking, not elsewhere classified     Problem List Patient Active Problem List   Diagnosis Date Noted  . Lumbar stenosis with neurogenic claudication 10/30/2018  . Cataract 06/20/2018  . Diverticulosis of colon 06/20/2018  . Essential hypertension 04/07/2018  . Carcinoma of prostate ([place]) 03/01/2018  . Malignant neoplasm of prostate ([place]) 02/28/2018  . Fecal occult blood test positive 03/11/2017  . Anal fissure  03/11/2017  . Constipation 03/11/2017  . Hypertensive left ventricular hypertrophy 03/11/2017  . Shoulder pain 03/11/2017  . Obesity 05/21/2016  . Prediabetes 05/20/2016  . Vitamin D deficiency 05/20/2016  . Primary erectile dysfunction 01/18/2016  . Palpitations 12/24/2012  . Left knee pain 08/05/2012  . Sciatica 06/11/2012  . Abnormal ECG 02/28/2009  . Mixed hyperlipidemia 02/24/2009    Ameriah Lint, PTA 12/22/2018, 3:20 PM  [facility] [address], [place], [place], [address] Phone: [phone]   Fax:  [phone]  Name: Gregory Marshall MRN: [id number] Date of Birth: 10-Feb-1950

## 2018-12-24 ENCOUNTER — Other Ambulatory Visit: Payer: Self-pay

## 2018-12-24 ENCOUNTER — Ambulatory Visit: Payer: Managed Care, Other (non HMO) | Admitting: Physical Therapy

## 2018-12-24 ENCOUNTER — Encounter: Payer: Self-pay | Admitting: Physical Therapy

## 2018-12-24 DIAGNOSIS — M545 Low back pain, unspecified: Secondary | ICD-10-CM

## 2018-12-24 DIAGNOSIS — R262 Difficulty in walking, not elsewhere classified: Secondary | ICD-10-CM

## 2018-12-24 DIAGNOSIS — M6281 Muscle weakness (generalized): Secondary | ICD-10-CM

## 2018-12-24 NOTE — Therapy (Signed)
[facility] [address] [place], [place], [address] Phone: [phone]   Fax:  [phone]  Physical Therapy Treatment  Patient Details  Name: Gregory Marshall MRN: [id number] Date of Birth: [birth year] Referring Provider (PT): Dr. Sherwood Gambler    Encounter Date: 12/24/2018  PT End of Session - 12/24/18 0841    Visit Number  5    Date for PT Re-Evaluation  02/05/19    Authorization Type  [employer]; Medicare:  10th visit progress note;  KX at visit 15    PT Start Time  [phone]    PT Stop Time  0945    PT Time Calculation (min)  64 min    Activity Tolerance  Patient tolerated treatment well    Behavior During Therapy  [facility] for tasks assessed/performed       Past Medical History:  Diagnosis Date  . Arthritis    shoulders  . Full dentures   . History of kidney stones   . Hyperlipidemia   . Hypertension   . Prostate cancer ([place])   . Renal calculus, left   . Vertigo    hx of, x 1 - resolved    Past Surgical History:  Procedure Laterality Date  . CARDIAC CATHETERIZATION    . CERVICAL FUSION  1998  . COLONOSCOPY  2008  . LUMBAR LAMINECTOMY/DECOMPRESSION MICRODISCECTOMY Left 06/21/2012   Procedure: LUMBAR LAMINECTOMY/DECOMPRESSION MICRODISCECTOMY 1 LEVEL;  Surgeon: Hosie Spangle, MD;  Location: [facility];  Service: Neurosurgery;  Laterality: Left;  LEFT L3-4 extraforaminal microdiskectomy  . MULTIPLE TOOTH EXTRACTIONS     full dentures  . RADIOACTIVE SEED IMPLANT N/A 07/07/2018   Procedure: RADIOACTIVE SEED IMPLANT/BRACHYTHERAPY IMPLANT;  Surgeon: Kathie Rhodes, MD;  Location[facility];  Service: Urology;  Laterality: N/A;  . SPACE OAR INSTILLATION N/A 07/07/2018   Procedure: SPACE OAR INSTILLATION;  Surgeon: Kathie Rhodes, MD;  Location: [facility];  Service: Urology;  Laterality: N/A;    There were no vitals filed for this visit.  Subjective Assessment - 12/24/18 0842    Subjective   I was "fine" after the last session, pain no worse. Still "feel " my left thigh intermittently.    Pertinent History  3rd back surgery: neck '98, left back '14 DDD;  recent surgery 10/30/2018 L3-5 fusion    Currently in Pain?  No/denies   BC i am sitting, If I stand or walk it goes to about 6-7/10.   Multiple Pain Sites  No                       [facility] Adult PT Treatment/Exercise - 12/24/18 0001      Lumbar Exercises: Stretches   Single Knee to Chest Stretch  Right;Left;1 rep;20 seconds    Lower Trunk Rotation  1 rep;10 seconds    Lower Trunk Rotation Limitations  Bil    Pelvic Tilt  10 reps      Lumbar Exercises: Aerobic   Stationary Bike  L1 x 10 min with discussion of status/ sitting vs standing ex.     UBE (Upper Arm Bike)  End tx; 2x2 L1      Lumbar Exercises: Standing   Other Standing Lumbar Exercises  Hip abduction bil 15x 2# added      Lumbar Exercises: Seated   Other Seated Lumbar Exercises  sitting on black pad: 4# shoulder to shoulder  15x    Other Seated Lumbar Exercises  sittin gon black  pad, red band horizontal abduction 10x2, added ball squeeze to increase core activation       Lumbar Exercises: Supine   Clam  15 reps   green band   Bridge with [employer]  10 reps    Bridge with [employer] Limitations  Vc to pelvic tilt first, small bridge most comfortable      Moist Heat Therapy   Number Minutes Moist Heat  15 Minutes    Moist Heat Location  Lumbar Spine   with estim     Electrical Stimulation   Electrical Stimulation Location  RT lumbar    Electrical Stimulation Action  IFC to tolerance    Electrical Stimulation Parameters  15 min hooklying    Electrical Stimulation Goals  Pain               PT Short Term Goals - 12/24/18 0901      PT SHORT TERM GOAL #1   Title  The patient will report understanding of basic self care and ex's to promote post surgical healing    Time  4    Period  Weeks    Status  Achieved    Target Date   01/08/19      PT SHORT TERM GOAL #2   Title  The patient will be able to walk 15 min with pain 5/10 or less    Time  4    Period  Weeks    Status  On-going   6-7/10     PT SHORT TERM GOAL #3   Title  The patient will report a 30% improvement in sleep; able to sleep in bed instead of the recliner    Time  4    Period  Weeks    Status  On-going   Still in recliner, plans to try this weekend     PT SHORT TERM GOAL #4   Title  The patient will be able to stand 5-10 minutes with pain level 5/10 or less    Time  4    Period  Weeks    Status  On-going   6-7/10       PT Long Term Goals - 12/11/18 2028      PT LONG TERM GOAL #1   Title  The patient will be independent with safe self progression of HEP    Time  8    Period  Weeks    Status  New    Target Date  02/05/19      PT LONG TERM GOAL #2   Title  The patient will report an overall improvement in pain and function at 60%    Time  8    Period  Weeks    Status  New      PT LONG TERM GOAL #3   Title  The patient will be able to walk for 20-25 minutes with pain level 4/10 or less    Time  8    Period  Weeks    Status  New      PT LONG TERM GOAL #4   Title  The patient will have lumbo/pelvic/hip core strength grossly 4+/5 needed for stair climbing and standing longer periods of time and return to cycling.    Time  8    Period  Weeks    Status  New      PT LONG TERM GOAL #5   Title  FOTO functional outcome score improved from 59% limitation to 41%  indicating improved function with less pain    Time  8    Period  Weeks    Status  New            Plan - 12/24/18 0841    Clinical Impression Statement  Pt arrives essentially pain free but continues to experience pain with mainly standing. His back pain he rates as 6-7/10 with short standing bouts and short walking bouts. He does report the pain is SLIGHTLY better after surgery.Pt has no reports of pain with exercises, he does report "feeling tightness" when i do  the bridge.    Personal Factors and Comorbidities  Age;Past/Current Experience;Comorbidity 1;Comorbidity 2    Comorbidities  2 previous spinal surgeries: neck in '98, back '14; multi level DDD and stenosis;  HTN; history of shoulder pain    Examination-Activity Limitations  Locomotion Level;Stairs;Stand;Carry;Bend;Sit;Lift;Squat;Sleep    Examination-Participation Restrictions  Community Activity;Other;Meal Prep;Shop    Stability/Clinical Decision Making  Evolving/Moderate complexity    Rehab Potential  Good    PT Frequency  2x / week    PT Duration  8 weeks    PT Treatment/Interventions  ADLs/Self Care Home Management;Electrical Stimulation;Cryotherapy;Moist Heat;Ultrasound;Neuromuscular re-education;Therapeutic exercise;Therapeutic activities;Patient/family education;Manual techniques;Taping;Dry needling    PT Next Visit Plan  MMT low performing muscles from eval, Nustep, continue with core strength, pt like the Estim at conclusion of session    PT Home Exercise Plan  Access Code: [email or web address]    Consulted and Agree with Plan of Care  Patient       Patient will benefit from skilled therapeutic intervention in order to improve the following deficits and impairments:  Difficulty walking, Increased muscle spasms, Increased fascial restricitons, Decreased activity tolerance, Pain, Decreased strength  Visit Diagnosis: Acute right-sided low back pain without sciatica  Muscle weakness (generalized)  Difficulty in walking, not elsewhere classified     Problem List Patient Active Problem List   Diagnosis Date Noted  . Lumbar stenosis with neurogenic claudication 10/30/2018  . Cataract 06/20/2018  . Diverticulosis of colon 06/20/2018  . Essential hypertension 04/07/2018  . Carcinoma of prostate ([place]) 03/01/2018  . Malignant neoplasm of prostate ([place]) 02/28/2018  . Fecal occult blood test positive 03/11/2017  . Anal fissure 03/11/2017  . Constipation 03/11/2017  . Hypertensive left  ventricular hypertrophy 03/11/2017  . Shoulder pain 03/11/2017  . Obesity 05/21/2016  . Prediabetes 05/20/2016  . Vitamin D deficiency 05/20/2016  . Primary erectile dysfunction 01/18/2016  . Palpitations 12/24/2012  . Left knee pain 08/05/2012  . Sciatica 06/11/2012  . Abnormal ECG 02/28/2009  . Mixed hyperlipidemia 02/24/2009    COCHRAN,JENNIFER, PTA 12/24/2018, 9:29 AM  [facility] [address], [place], [place], [address] Phone: [phone]   Fax:  [phone]  Name: Gregory Marshall MRN: [id number] Date of Birth: 08-11-1950

## 2018-12-30 ENCOUNTER — Other Ambulatory Visit: Payer: Self-pay

## 2018-12-30 ENCOUNTER — Encounter: Payer: Self-pay | Admitting: Physical Therapy

## 2018-12-30 ENCOUNTER — Ambulatory Visit: Payer: Managed Care, Other (non HMO) | Attending: Neurosurgery | Admitting: Physical Therapy

## 2018-12-30 DIAGNOSIS — M545 Low back pain, unspecified: Secondary | ICD-10-CM

## 2018-12-30 DIAGNOSIS — M6281 Muscle weakness (generalized): Secondary | ICD-10-CM | POA: Diagnosis present

## 2018-12-30 DIAGNOSIS — R262 Difficulty in walking, not elsewhere classified: Secondary | ICD-10-CM | POA: Insufficient documentation

## 2018-12-30 NOTE — Therapy (Signed)
[facility] [address], [place], [place], [address] Phone: [phone]   Fax:  [phone]  Physical Therapy Treatment  Patient Details  Name: Gregory Marshall MRN: [id number] Date of Birth: 02/10/1950 Referring Provider (PT): Dr. Sherwood Gambler    Encounter Date: 12/30/2018  PT End of Session - 12/30/18 1719    Visit Number  6    Date for PT Re-Evaluation  02/05/19    Authorization Type  [employer]; Medicare:  10th visit progress note;  KX at visit 15    PT Start Time  1447    PT Stop Time  1529    PT Time Calculation (min)  42 min    Activity Tolerance  Patient tolerated treatment well       Past Medical History:  Diagnosis Date  . Arthritis    shoulders  . Full dentures   . History of kidney stones   . Hyperlipidemia   . Hypertension   . Prostate cancer ([place])   . Renal calculus, left   . Vertigo    hx of, x 1 - resolved    Past Surgical History:  Procedure Laterality Date  . CARDIAC CATHETERIZATION    . CERVICAL FUSION  1998  . COLONOSCOPY  2008  . LUMBAR LAMINECTOMY/DECOMPRESSION MICRODISCECTOMY Left 06/21/2012   Procedure: LUMBAR LAMINECTOMY/DECOMPRESSION MICRODISCECTOMY 1 LEVEL;  Surgeon: Hosie Spangle, MD;  Location: [place] [facility];  Service: Neurosurgery;  Laterality: Left;  LEFT L3-4 extraforaminal microdiskectomy  . MULTIPLE TOOTH EXTRACTIONS     full dentures  . RADIOACTIVE SEED IMPLANT N/A 07/07/2018   Procedure: RADIOACTIVE SEED IMPLANT/BRACHYTHERAPY IMPLANT;  Surgeon: Kathie Rhodes, MD;  Location: [facility];  Service: Urology;  Laterality: N/A;  . SPACE OAR INSTILLATION N/A 07/07/2018   Procedure: SPACE OAR INSTILLATION;  Surgeon: Kathie Rhodes, MD;  Location: [facility]  Service: Urology;  Laterality: N/A;    There were no vitals filed for this visit.  Subjective Assessment - 12/30/18 1449    Subjective  My left leg pain comes and goes and today is a 10/10.  My  back is about a 5/10.  I've been riding the ex bike at the gym and that doesn't bother me.    Pertinent History  3rd back surgery: neck '98, left back '14 DDD;  recent surgery 10/30/2018 L3-5 fusion    Currently in Pain?  Yes    Pain Score  10-Worst pain ever    Pain Orientation  Left    Pain Type  Surgical pain    Pain Radiating Towards  left LE    Pain Frequency  Intermittent    Aggravating Factors   standing/walking                       [facility] Adult PT Treatment/Exercise - 12/30/18 0001      Lumbar Exercises: Stretches   Single Knee to Chest Stretch  Right;Left;1 rep;20 seconds    Piriformis Stretch Limitations  1x right/left 20 sec holds.      Other Lumbar Stretch Exercise  right and left sciatic floss in supine 10x each side       Lumbar Exercises: Aerobic   Stationary Bike  L1 x 10 min with discussion of status/ sitting vs standing ex.       Lumbar Exercises: Machines for Strengthening   Leg Press  seat 8 30# 10x; 50# 10x bil       Lumbar Exercises: Seated  LAQ on Chair Limitations  self resisted ankle dorsiflexion 10x each side      Sit to Stand  5 reps    Sit to Stand Limitations  from mat table     Other Seated Lumbar Exercises  seated in chair pulley 25# rows and shoulder extensions 2x 10 each     Other Seated Lumbar Exercises  foam roll push down 10x 5 sec holds       Lumbar Exercises: Supine   Bent Knee Raise  5 reps    Isometric Hip Flexion  5 reps      Lumbar Exercises: Sidelying   Clam  Right;Left;10 reps               PT Short Term Goals - 12/24/18 0901      PT SHORT TERM GOAL #1   Title  The patient will report understanding of basic self care and ex's to promote post surgical healing    Time  4    Period  Weeks    Status  Achieved    Target Date  01/08/19      PT SHORT TERM GOAL #2   Title  The patient will be able to walk 15 min with pain 5/10 or less    Time  4    Period  Weeks    Status  On-going   6-7/10     PT  SHORT TERM GOAL #3   Title  The patient will report a 30% improvement in sleep; able to sleep in bed instead of the recliner    Time  4    Period  Weeks    Status  On-going   Still in recliner, plans to try this weekend     PT [place] #4   Title  The patient will be able to stand 5-10 minutes with pain level 5/10 or less    Time  4    Period  Weeks    Status  On-going   6-7/10       PT Long Term Goals - 12/11/18 2028      PT LONG TERM GOAL #1   Title  The patient will be independent with safe self progression of HEP    Time  8    Period  Weeks    Status  New    Target Date  02/05/19      PT LONG TERM GOAL #2   Title  The patient will report an overall improvement in pain and function at 60%    Time  8    Period  Weeks    Status  New      PT LONG TERM GOAL #3   Title  The patient will be able to walk for 20-25 minutes with pain level 4/10 or less    Time  8    Period  Weeks    Status  New      PT LONG TERM GOAL #4   Title  The patient will have lumbo/pelvic/hip core strength grossly 4+/5 needed for stair climbing and standing longer periods of time and return to cycling.    Time  8    Period  Weeks    Status  New      PT LONG TERM GOAL #5   Title  FOTO functional outcome score improved from 59% limitation to 41% indicating improved function with less pain    Time  8    Period  Weeks  Status  New            Plan - 12/30/18 1720    Clinical Impression Statement  The patient continues to be concerned about left LE symptoms present with standing/walking.  Treatment focus on LE and core strengthening in non weight bearing positions to avoid exacerbation of LE pain.  He was able to perform these ex's without production of LE symptoms.  Therapist closely monitoring response with all.    Comorbidities  2 previous spinal surgeries: neck in '98, back '14; multi level DDD and stenosis;  HTN; history of shoulder pain    PT Frequency  2x / week    PT Duration   8 weeks    PT Treatment/Interventions  ADLs/Self Care Home Management;Electrical Stimulation;Cryotherapy;Moist Heat;Ultrasound;Neuromuscular re-education;Therapeutic exercise;Therapeutic activities;Patient/family education;Manual techniques;Taping;Dry needling    PT Next Visit Plan  MMT low performing muscles from eval, assess response to added strengthening ex;  bike or Nustep, continue with core strength;  ES as needed    PT Home Exercise Plan  Access Code: [email or web address]       Patient will benefit from skilled therapeutic intervention in order to improve the following deficits and impairments:  Difficulty walking, Increased muscle spasms, Increased fascial restricitons, Decreased activity tolerance, Pain, Decreased strength  Visit Diagnosis: Acute right-sided low back pain without sciatica  Muscle weakness (generalized)  Difficulty in walking, not elsewhere classified     Problem List Patient Active Problem List   Diagnosis Date Noted  . Lumbar stenosis with neurogenic claudication 10/30/2018  . Cataract 06/20/2018  . Diverticulosis of colon 06/20/2018  . Essential hypertension 04/07/2018  . Carcinoma of prostate ([place]) 03/01/2018  . Malignant neoplasm of prostate ([place]) 02/28/2018  . Fecal occult blood test positive 03/11/2017  . Anal fissure 03/11/2017  . Constipation 03/11/2017  . Hypertensive left ventricular hypertrophy 03/11/2017  . Shoulder pain 03/11/2017  . Obesity 05/21/2016  . Prediabetes 05/20/2016  . Vitamin D deficiency 05/20/2016  . Primary erectile dysfunction 01/18/2016  . Palpitations 12/24/2012  . Left knee pain 08/05/2012  . Sciatica 06/11/2012  . Abnormal ECG 02/28/2009  . Mixed hyperlipidemia 02/24/2009   Ruben Im, PT 12/30/18 5:25 PM Phone: [phone] Fax: [phone] Alvera Singh 12/30/2018, 5:25 PM  [facility] [address] [place], [place], [address] Phone: [phone]    Fax:  [phone]  Name: Gregory Marshall MRN: [id number] Date of Birth: 02/25/1950

## 2019-01-01 ENCOUNTER — Ambulatory Visit: Payer: Managed Care, Other (non HMO) | Admitting: Physical Therapy

## 2019-01-01 ENCOUNTER — Encounter: Payer: Self-pay | Admitting: Physical Therapy

## 2019-01-01 ENCOUNTER — Other Ambulatory Visit: Payer: Self-pay

## 2019-01-01 DIAGNOSIS — M545 Low back pain, unspecified: Secondary | ICD-10-CM

## 2019-01-01 DIAGNOSIS — M6281 Muscle weakness (generalized): Secondary | ICD-10-CM

## 2019-01-01 DIAGNOSIS — R262 Difficulty in walking, not elsewhere classified: Secondary | ICD-10-CM

## 2019-01-01 NOTE — Therapy (Signed)
[facility] [address] [place], [place], [address] Phone: [phone]   Fax:  [phone]  Physical Therapy Treatment  Patient Details  Name: Gregory Marshall MRN: [id number] Date of Birth: 1950-08-01 Referring Provider (PT): Dr. Sherwood Gambler    Encounter Date: 01/01/2019  PT End of Session - 01/01/19 1154    Visit Number  7    Date for PT Re-Evaluation  02/05/19    Authorization Type  [employer]; Medicare:  10th visit progress note;  KX at visit 15    PT Start Time  1147    PT Stop Time  1225    PT Time Calculation (min)  38 min    Activity Tolerance  Patient limited by pain       Past Medical History:  Diagnosis Date  . Arthritis    shoulders  . Full dentures   . History of kidney stones   . Hyperlipidemia   . Hypertension   . Prostate cancer ([place])   . Renal calculus, left   . Vertigo    hx of, x 1 - resolved    Past Surgical History:  Procedure Laterality Date  . CARDIAC CATHETERIZATION    . CERVICAL FUSION  1998  . COLONOSCOPY  2008  . LUMBAR LAMINECTOMY/DECOMPRESSION MICRODISCECTOMY Left 06/21/2012   Procedure: LUMBAR LAMINECTOMY/DECOMPRESSION MICRODISCECTOMY 1 LEVEL;  Surgeon: Hosie Spangle, MD;  Location: [facility] [facility];  Service: Neurosurgery;  Laterality: Left;  LEFT L3-4 extraforaminal microdiskectomy  . MULTIPLE TOOTH EXTRACTIONS     full dentures  . RADIOACTIVE SEED IMPLANT N/A 07/07/2018   Procedure: RADIOACTIVE SEED IMPLANT/BRACHYTHERAPY IMPLANT;  Surgeon: Kathie Rhodes, MD;  Location: [facility];  Service: Urology;  Laterality: N/A;  . SPACE OAR INSTILLATION N/A 07/07/2018   Procedure: SPACE OAR INSTILLATION;  Surgeon: Kathie Rhodes, MD;  Location: [facility];  Service: Urology;  Laterality: N/A;    There were no vitals filed for this visit.  Subjective Assessment - 01/01/19 1147    Subjective  Called the doctor's office and will be seen tomorrow regarding severe  leg pain.  After PT it was a little better but yesterday was bad.  The leg hurts more than the back.  Sharp pain with weightbearing.   Sleeping in the recliner.    Pertinent History  3rd back surgery: neck '98, left back '14 DDD;  recent surgery 10/30/2018 L3-5 fusion    How long can you stand comfortably?  5-10 minutes 7-8/10    How long can you walk comfortably?  pain immediately but can do 15-20 walks with SPC   7-8/10    Currently in Pain?  Yes    Pain Score  4     Pain Location  Leg    Pain Orientation  Left    Pain Radiating Towards  left leg                       [facility] Adult PT Treatment/Exercise - 01/01/19 0001      Lumbar Exercises: Aerobic   Stationary Bike  L1 x 8 min with discussion of status/ sitting vs standing ex.       Lumbar Exercises: Machines for Strengthening   Leg Press  seat 8 50# bil 20x ; calf press 50# 10x    less pain with smaller range of motion   Other Lumbar Machine Exercise  seated row 20# x 10       Lumbar Exercises: Seated  Sit to Stand  5 reps    Sit to Stand Limitations  holding 5# weight     Other Seated Lumbar Exercises  holding 5# weight chops 10x       Lumbar Exercises: Supine   Bent Knee Raise  5 reps    Isometric Hip Flexion  5 reps    Other Supine Lumbar Exercises  bil UE isometric extension 5x    Other Supine Lumbar Exercises  whole leg press down, whole leg lengthener  5 sec holds 5x each right, left too painful       Lumbar Exercises: Sidelying   Clam  Right;Left;10 reps    Clam Limitations  --               PT Short Term Goals - 12/24/18 0901      PT SHORT TERM GOAL #1   Title  The patient will report understanding of basic self care and ex's to promote post surgical healing    Time  4    Period  Weeks    Status  Achieved    Target Date  01/08/19      PT SHORT TERM GOAL #2   Title  The patient will be able to walk 15 min with pain 5/10 or less    Time  4    Period  Weeks    Status  On-going    6-7/10     PT SHORT TERM GOAL #3   Title  The patient will report a 30% improvement in sleep; able to sleep in bed instead of the recliner    Time  4    Period  Weeks    Status  On-going   Still in recliner, plans to try this weekend     PT SHORT TERM GOAL #4   Title  The patient will be able to stand 5-10 minutes with pain level 5/10 or less    Time  4    Period  Weeks    Status  On-going   6-7/10       PT Long Term Goals - 12/11/18 2028      PT LONG TERM GOAL #1   Title  The patient will be independent with safe self progression of HEP    Time  8    Period  Weeks    Status  New    Target Date  02/05/19      PT LONG TERM GOAL #2   Title  The patient will report an overall improvement in pain and function at 60%    Time  8    Period  Weeks    Status  New      PT LONG TERM GOAL #3   Title  The patient will be able to walk for 20-25 minutes with pain level 4/10 or less    Time  8    Period  Weeks    Status  New      PT LONG TERM GOAL #4   Title  The patient will have lumbo/pelvic/hip core strength grossly 4+/5 needed for stair climbing and standing longer periods of time and return to cycling.    Time  8    Period  Weeks    Status  New      PT LONG TERM GOAL #5   Title  FOTO functional outcome score improved from 59% limitation to 41% indicating improved function with less pain    Time  8  Period  Weeks    Status  New            Plan - 01/01/19 1205    Clinical Impression Statement  The patient continues to have moderate to severe left LE pain particularly with standing and walking, not as much in supine and sitting.  He is able to perform low level trunk and LE strengthening in non-weight bearing positions with no increase in pain intensity.  He is concerned about this severe LE pain that initially improved after surgery but then returned.  He will discuss with the doctor tomorrow.  Therapist closely monitoring response and modifying ex's/treatment  interventions secondary to pain.    Comorbidities  2 previous spinal surgeries: neck in '98, back '14; multi level DDD and stenosis;  HTN; history of shoulder pain    Rehab Potential  Good    PT Frequency  2x / week    PT Duration  8 weeks    PT Treatment/Interventions  ADLs/Self Care Home Management;Electrical Stimulation;Cryotherapy;Moist Heat;Ultrasound;Neuromuscular re-education;Therapeutic exercise;Therapeutic activities;Patient/family education;Manual techniques;Taping;Dry needling    PT Next Visit Plan  see how MD appt went;  LE and core strengthening in unloaded positions    PT Home Exercise Plan  Access Code: [email or web address]       Patient will benefit from skilled therapeutic intervention in order to improve the following deficits and impairments:     Visit Diagnosis: Acute right-sided low back pain without sciatica  Muscle weakness (generalized)  Difficulty in walking, not elsewhere classified     Problem List Patient Active Problem List   Diagnosis Date Noted  . Lumbar stenosis with neurogenic claudication 10/30/2018  . Cataract 06/20/2018  . Diverticulosis of colon 06/20/2018  . Essential hypertension 04/07/2018  . Carcinoma of prostate ([place]) 03/01/2018  . Malignant neoplasm of prostate ([place]) 02/28/2018  . Fecal occult blood test positive 03/11/2017  . Anal fissure 03/11/2017  . Constipation 03/11/2017  . Hypertensive left ventricular hypertrophy 03/11/2017  . Shoulder pain 03/11/2017  . Obesity 05/21/2016  . Prediabetes 05/20/2016  . Vitamin D deficiency 05/20/2016  . Primary erectile dysfunction 01/18/2016  . Palpitations 12/24/2012  . Left knee pain 08/05/2012  . Sciatica 06/11/2012  . Abnormal ECG 02/28/2009  . Mixed hyperlipidemia 02/24/2009   Ruben Im, PT 01/01/19 2:04 PM Phone: [phone] Fax: [phone] Alvera Singh 01/01/2019, 2:03 PM  [facility] [address], [place], [place], [address] Phone: [phone]   Fax:  [phone]  Name: Gregory Marshall MRN: [id number] Date of Birth: 04/24/1950

## 2019-01-06 ENCOUNTER — Encounter: Payer: Self-pay | Admitting: Physical Therapy

## 2019-01-06 ENCOUNTER — Ambulatory Visit: Payer: Managed Care, Other (non HMO) | Admitting: Physical Therapy

## 2019-01-06 ENCOUNTER — Other Ambulatory Visit: Payer: Self-pay

## 2019-01-06 DIAGNOSIS — M545 Low back pain, unspecified: Secondary | ICD-10-CM

## 2019-01-06 DIAGNOSIS — M6281 Muscle weakness (generalized): Secondary | ICD-10-CM

## 2019-01-06 DIAGNOSIS — R262 Difficulty in walking, not elsewhere classified: Secondary | ICD-10-CM

## 2019-01-06 NOTE — Therapy (Signed)
[facility] [address], [place], [place], [address] Phone: [phone]   Fax:  [phone]  Physical Therapy Treatment  Patient Details  Name: Gregory Marshall MRN: [id number] Date of Birth: 22-Nov-1950 Referring Provider (PT): Dr. Sherwood Gambler    Encounter Date: 01/06/2019  PT End of Session - 01/06/19 1222    Visit Number  8    Date for PT Re-Evaluation  02/05/19    Authorization Type  [employer]; Medicare:  10th visit progress note;  KX at visit 15    PT Start Time  1145    PT Stop Time  1224    PT Time Calculation (min)  39 min    Activity Tolerance  Patient tolerated treatment well       Past Medical History:  Diagnosis Date  . Arthritis    shoulders  . Full dentures   . History of kidney stones   . Hyperlipidemia   . Hypertension   . Prostate cancer ([place])   . Renal calculus, left   . Vertigo    hx of, x 1 - resolved    Past Surgical History:  Procedure Laterality Date  . CARDIAC CATHETERIZATION    . CERVICAL FUSION  1998  . COLONOSCOPY  2008  . LUMBAR LAMINECTOMY/DECOMPRESSION MICRODISCECTOMY Left 06/21/2012   Procedure: LUMBAR LAMINECTOMY/DECOMPRESSION MICRODISCECTOMY 1 LEVEL;  Surgeon: Hosie Spangle, MD;  Location: [place] [facility];  Service: Neurosurgery;  Laterality: Left;  LEFT L3-4 extraforaminal microdiskectomy  . MULTIPLE TOOTH EXTRACTIONS     full dentures  . RADIOACTIVE SEED IMPLANT N/A 07/07/2018   Procedure: RADIOACTIVE SEED IMPLANT/BRACHYTHERAPY IMPLANT;  Surgeon: Kathie Rhodes, MD;  Location: [facility];  Service: Urology;  Laterality: N/A;  . SPACE OAR INSTILLATION N/A 07/07/2018   Procedure: SPACE OAR INSTILLATION;  Surgeon: Kathie Rhodes, MD;  Location: [facility];  Service: Urology;  Laterality: N/A;    There were no vitals filed for this visit.  Subjective Assessment - 01/06/19 1146    Subjective  Saw the doctor.  X-rays look good.  Doctor feels nerve might  be irritated.  Started on Gabapentin.  If no better in January then MRI.  Leg still hurts with walking, better with sitting down.  Low back stiff/tight.  I went to the gym and did some light weights and it did OK.  I'm going to try my bike again this weekend.    Pertinent History  3rd back surgery: neck '98, left back '14 DDD;  recent surgery 10/30/2018 L3-5 fusion    How long can you walk comfortably?  pain immediately but can do 15-20 walks with SPC   7-8/10    Currently in Pain?  Yes    Pain Score  5     Pain Location  Leg    Pain Orientation  Left    Pain Type  Surgical pain                       [facility] Adult PT Treatment/Exercise - 01/06/19 0001      Therapeutic Activites    Therapeutic Activities  ADL's    ADL's  sit to stand, standing, walking      Lumbar Exercises: Aerobic   Stationary Bike  L1 x 8 min with discussion of status/MD appt       Lumbar Exercises: Machines for Strengthening   Leg Press  seat 8: bil 50# 10x; 25# single leg 10x right/left; calf press 25#  10x       Lumbar Exercises: Standing   Other Standing Lumbar Exercises  backwards resisted walk 25# 6x     Other Standing Lumbar Exercises  resisted walk 20# side step 3x right/left       Lumbar Exercises: Seated   Sit to Stand  10 reps    Sit to Stand Limitations  holding 5# weight     Other Seated Lumbar Exercises  corner squats 5# between knees to chest  5x       Lumbar Exercises: Supine   Other Supine Lumbar Exercises  marching 10x n    Other Supine Lumbar Exercises  double and single clams 10x       Lumbar Exercises: Sidelying   Clam  Right;Left;15 reps    Clam Limitations  green band       Lumbar Exercises: Quadruped   Other Quadruped Lumbar Exercises  over red ball hip extension 5x right/left     Other Quadruped Lumbar Exercises  leaning over red ball bil UE raises 10x                PT Short Term Goals - 12/24/18 0901      PT SHORT TERM GOAL #1   Title  The patient will  report understanding of basic self care and ex's to promote post surgical healing    Time  4    Period  Weeks    Status  Achieved    Target Date  01/08/19      PT SHORT TERM GOAL #2   Title  The patient will be able to walk 15 min with pain 5/10 or less    Time  4    Period  Weeks    Status  On-going   6-7/10     PT SHORT TERM GOAL #3   Title  The patient will report a 30% improvement in sleep; able to sleep in bed instead of the recliner    Time  4    Period  Weeks    Status  On-going   Still in recliner, plans to try this weekend     PT SHORT TERM GOAL #4   Title  The patient will be able to stand 5-10 minutes with pain level 5/10 or less    Time  4    Period  Weeks    Status  On-going   6-7/10       PT Long Term Goals - 12/11/18 2028      PT LONG TERM GOAL #1   Title  The patient will be independent with safe self progression of HEP    Time  8    Period  Weeks    Status  New    Target Date  02/05/19      PT LONG TERM GOAL #2   Title  The patient will report an overall improvement in pain and function at 60%    Time  8    Period  Weeks    Status  New      PT LONG TERM GOAL #3   Title  The patient will be able to walk for 20-25 minutes with pain level 4/10 or less    Time  8    Period  Weeks    Status  New      PT LONG TERM GOAL #4   Title  The patient will have lumbo/pelvic/hip core strength grossly 4+/5 needed for stair climbing and standing longer  periods of time and return to cycling.    Time  8    Period  Weeks    Status  New      PT LONG TERM GOAL #5   Title  FOTO functional outcome score improved from 59% limitation to 41% indicating improved function with less pain    Time  8    Period  Weeks    Status  New            Plan - 01/06/19 1223    Clinical Impression Statement  The patient is able to perform a progression of lumbo/pelvic/hip and LE strengthening including some weight bearing exercises without the production of sharp left LE  pain.  He does report some low back and hip muscular discomfort which remains at a lower intensity and coincides with the muscles being activated during the exercise.  He is able to go through the full ROM today on the leg press.  Therapist closely monitoring response with all.    Comorbidities  2 previous spinal surgeries: neck in '98, back '14; multi level DDD and stenosis;  HTN; history of shoulder pain    PT Frequency  2x / week    PT Duration  8 weeks    PT Treatment/Interventions  ADLs/Self Care Home Management;Electrical Stimulation;Cryotherapy;Moist Heat;Ultrasound;Neuromuscular re-education;Therapeutic exercise;Therapeutic activities;Patient/family education;Manual techniques;Taping;Dry needling    PT Next Visit Plan  check STGS;  LE and core strengthening in unloaded positions progressing toward limited weight bearing positions    PT Home Exercise Plan  Access Code: [email or web address]       Patient will benefit from skilled therapeutic intervention in order to improve the following deficits and impairments:  Difficulty walking, Increased muscle spasms, Increased fascial restricitons, Decreased activity tolerance, Pain, Decreased strength  Visit Diagnosis: Acute right-sided low back pain without sciatica  Muscle weakness (generalized)  Difficulty in walking, not elsewhere classified     Problem List Patient Active Problem List   Diagnosis Date Noted  . Lumbar stenosis with neurogenic claudication 10/30/2018  . Cataract 06/20/2018  . Diverticulosis of colon 06/20/2018  . Essential hypertension 04/07/2018  . Carcinoma of prostate ([place]) 03/01/2018  . Malignant neoplasm of prostate ([place]) 02/28/2018  . Fecal occult blood test positive 03/11/2017  . Anal fissure 03/11/2017  . Constipation 03/11/2017  . Hypertensive left ventricular hypertrophy 03/11/2017  . Shoulder pain 03/11/2017  . Obesity 05/21/2016  . Prediabetes 05/20/2016  . Vitamin D deficiency 05/20/2016  . Primary erectile  dysfunction 01/18/2016  . Palpitations 12/24/2012  . Left knee pain 08/05/2012  . Sciatica 06/11/2012  . Abnormal ECG 02/28/2009  . Mixed hyperlipidemia 02/24/2009   Ruben Im, PT 01/06/19 4:45 PM Phone: [phone] Fax: [phone] Alvera Singh 01/06/2019, 4:44 PM  [facility] [address], [place], [place], [address] Phone: [phone]   Fax:  [phone]  Name: Gregory Marshall MRN: [id number] Date of Birth: October 09, 1950

## 2019-01-08 ENCOUNTER — Encounter: Payer: Self-pay | Admitting: Physical Therapy

## 2019-01-08 ENCOUNTER — Other Ambulatory Visit: Payer: Self-pay

## 2019-01-08 ENCOUNTER — Ambulatory Visit: Payer: Managed Care, Other (non HMO) | Admitting: Physical Therapy

## 2019-01-08 DIAGNOSIS — M545 Low back pain, unspecified: Secondary | ICD-10-CM

## 2019-01-08 DIAGNOSIS — R262 Difficulty in walking, not elsewhere classified: Secondary | ICD-10-CM

## 2019-01-08 DIAGNOSIS — M6281 Muscle weakness (generalized): Secondary | ICD-10-CM

## 2019-01-08 NOTE — Therapy (Signed)
[facility] [address], [place], [place], [address] Phone: [phone]   Fax:  [phone]  Physical Therapy Treatment  Patient Details  Name: Gregory Marshall MRN: [id number] Date of Birth: Apr 10, 1950 Referring Provider (PT): Dr. Sherwood Gambler    Encounter Date: 01/08/2019  PT End of Session - 01/08/19 1223    Visit Number  9    Date for PT Re-Evaluation  02/05/19    Authorization Type  [employer]; Medicare:  10th visit progress note;  KX at visit 15    PT Start Time  1149    PT Stop Time  1227    PT Time Calculation (min)  38 min    Activity Tolerance  Patient tolerated treatment well       Past Medical History:  Diagnosis Date  . Arthritis    shoulders  . Full dentures   . History of kidney stones   . Hyperlipidemia   . Hypertension   . Prostate cancer ([place])   . Renal calculus, left   . Vertigo    hx of, x 1 - resolved    Past Surgical History:  Procedure Laterality Date  . CARDIAC CATHETERIZATION    . CERVICAL FUSION  1998  . COLONOSCOPY  2008  . LUMBAR LAMINECTOMY/DECOMPRESSION MICRODISCECTOMY Left 06/21/2012   Procedure: LUMBAR LAMINECTOMY/DECOMPRESSION MICRODISCECTOMY 1 LEVEL;  Surgeon: Hosie Spangle, MD;  Location: [place] [facility];  Service: Neurosurgery;  Laterality: Left;  LEFT L3-4 extraforaminal microdiskectomy  . MULTIPLE TOOTH EXTRACTIONS     full dentures  . RADIOACTIVE SEED IMPLANT N/A 07/07/2018   Procedure: RADIOACTIVE SEED IMPLANT/BRACHYTHERAPY IMPLANT;  Surgeon: Kathie Rhodes, MD;  Location[facility];  Service: Urology;  Laterality: N/A;  . SPACE OAR INSTILLATION N/A 07/07/2018   Procedure: SPACE OAR INSTILLATION;  Surgeon: Kathie Rhodes, MD;  Location[facility];  Service: Urology;  Laterality: N/A;    There were no vitals filed for this visit.  Subjective Assessment - 01/08/19 1150    Subjective  Left leg and back is painful today.  They take turns.   Did  fine after last visit.    Pertinent History  3rd back surgery: neck '98, left back '14 DDD;  recent surgery 10/30/2018 L3-5 fusion    Currently in Pain?  Yes    Pain Score  8     Pain Location  Back                       [facility] Adult PT Treatment/Exercise - 01/08/19 0001      Therapeutic Activites    Therapeutic Activities  ADL's    ADL's  sit to stand, standing, walking      Lumbar Exercises: Stretches   Active Hamstring Stretch  Right;Left;5 reps    Active Hamstring Stretch Limitations  seated    Double Knee to Chest Stretch Limitations  bil LEs on ball 10x    Other Lumbar Stretch Exercise  childs pose 3x     Other Lumbar Stretch Exercise  right and left sciatic floss in supine 10x each side       Lumbar Exercises: Aerobic   Stationary Bike  L1 x 8 min with discussion of status      Lumbar Exercises: Machines for Strengthening   Leg Press  seat 8:  25# single leg 10x right/left; calf press 25# 10x  right and left     Other Lumbar Machine Exercise  seated lat bar  25# 15x    Other Lumbar Machine Exercise  seated row 25# 15x      Lumbar Exercises: Standing   Other Standing Lumbar Exercises  leaning forward on high table hip extension 10x right/left       Lumbar Exercises: Seated   Sit to Stand Limitations  holding  8# weight 5x     Other Seated Lumbar Exercises  staggered stand 3x each side      Lumbar Exercises: Sidelying   Clam  Right;Left;15 reps    Clam Limitations  green band       Lumbar Exercises: Prone   Straight Leg Raise  5 reps    Straight Leg Raises Limitations  over 1 pillow    Other Prone Lumbar Exercises  HS curls 5x right/left over 1 pillow       Lumbar Exercises: Quadruped   Single Arm Raise  Right;Left;5 reps               PT Short Term Goals - 12/24/18 0901      PT SHORT TERM GOAL #1   Title  The patient will report understanding of basic self care and ex's to promote post surgical healing    Time  4    Period  Weeks     Status  Achieved    Target Date  01/08/19      PT SHORT TERM GOAL #2   Title  The patient will be able to walk 15 min with pain 5/10 or less    Time  4    Period  Weeks    Status  On-going   6-7/10     PT SHORT TERM GOAL #3   Title  The patient will report a 30% improvement in sleep; able to sleep in bed instead of the recliner    Time  4    Period  Weeks    Status  On-going   Still in recliner, plans to try this weekend     PT [place] #4   Title  The patient will be able to stand 5-10 minutes with pain level 5/10 or less    Time  4    Period  Weeks    Status  On-going   6-7/10       PT Long Term Goals - 12/11/18 2028      PT LONG TERM GOAL #1   Title  The patient will be independent with safe self progression of HEP    Time  8    Period  Weeks    Status  New    Target Date  02/05/19      PT LONG TERM GOAL #2   Title  The patient will report an overall improvement in pain and function at 60%    Time  8    Period  Weeks    Status  New      PT LONG TERM GOAL #3   Title  The patient will be able to walk for 20-25 minutes with pain level 4/10 or less    Time  8    Period  Weeks    Status  New      PT LONG TERM GOAL #4   Title  The patient will have lumbo/pelvic/hip core strength grossly 4+/5 needed for stair climbing and standing longer periods of time and return to cycling.    Time  8    Period  Weeks    Status  New  PT LONG TERM GOAL #5   Title  FOTO functional outcome score improved from 59% limitation to 41% indicating improved function with less pain    Time  8    Period  Weeks    Status  New            Plan - 01/08/19 1225    Clinical Impression Statement  Despite an increase in pain intensity, he is able to participate in a progression of lumbo/pelvic/hip strengthening in quadruped and prone over pillow positions.  Verbal cues on exercise technique to avoid lumbar hyperextension.   Therapist closely monitoring response with all  interventions.   He reports decreased pain following treatment session despite the progression.    Comorbidities  2 previous spinal surgeries: neck in '98, back '14; multi level DDD and stenosis;  HTN; history of shoulder pain    Rehab Potential  Good    PT Frequency  2x / week    PT Duration  8 weeks    PT Treatment/Interventions  ADLs/Self Care Home Management;Electrical Stimulation;Cryotherapy;Moist Heat;Ultrasound;Neuromuscular re-education;Therapeutic exercise;Therapeutic activities;Patient/family education;Manual techniques;Taping;Dry needling    PT Next Visit Plan  10th visit progress note;  standing bent over hip extension;  prone over pillow;   LE and core strengthening in unloaded positions progressing toward limited weight bearing positions    PT Home Exercise Plan  Access Code: [email or web address]       Patient will benefit from skilled therapeutic intervention in order to improve the following deficits and impairments:  Difficulty walking, Increased muscle spasms, Increased fascial restricitons, Decreased activity tolerance, Pain, Decreased strength  Visit Diagnosis: Acute right-sided low back pain without sciatica  Muscle weakness (generalized)  Difficulty in walking, not elsewhere classified     Problem List Patient Active Problem List   Diagnosis Date Noted  . Lumbar stenosis with neurogenic claudication 10/30/2018  . Cataract 06/20/2018  . Diverticulosis of colon 06/20/2018  . Essential hypertension 04/07/2018  . Carcinoma of prostate ([place]) 03/01/2018  . Malignant neoplasm of prostate ([place]) 02/28/2018  . Fecal occult blood test positive 03/11/2017  . Anal fissure 03/11/2017  . Constipation 03/11/2017  . Hypertensive left ventricular hypertrophy 03/11/2017  . Shoulder pain 03/11/2017  . Obesity 05/21/2016  . Prediabetes 05/20/2016  . Vitamin D deficiency 05/20/2016  . Primary erectile dysfunction 01/18/2016  . Palpitations 12/24/2012  . Left knee pain 08/05/2012  .  Sciatica 06/11/2012  . Abnormal ECG 02/28/2009  . Mixed hyperlipidemia 02/24/2009   Ruben Im, PT 01/08/19 8:41 PM Phone: [phone] Fax: [phone] Alvera Singh 01/08/2019, 8:41 PM  [facility] [address] [place], [place], [address] Phone: [phone]   Fax:  [phone]  Name: Carston Behrns MRN: [id number] Date of Birth: Jun 11, 1950

## 2019-01-13 ENCOUNTER — Encounter: Payer: Self-pay | Admitting: Physical Therapy

## 2019-01-13 ENCOUNTER — Other Ambulatory Visit: Payer: Self-pay

## 2019-01-13 ENCOUNTER — Ambulatory Visit: Payer: Managed Care, Other (non HMO) | Admitting: Physical Therapy

## 2019-01-13 DIAGNOSIS — M545 Low back pain, unspecified: Secondary | ICD-10-CM

## 2019-01-13 DIAGNOSIS — R262 Difficulty in walking, not elsewhere classified: Secondary | ICD-10-CM

## 2019-01-13 DIAGNOSIS — M6281 Muscle weakness (generalized): Secondary | ICD-10-CM

## 2019-01-13 NOTE — Therapy (Signed)
[facility] [address] [place], [place], [address] Phone: [phone]   Fax:  [phone]  Physical Therapy Treatment  Patient Details  Name: Gregory Marshall MRN: [id number] Date of Birth: February 13, 1950 Referring Provider (PT): Dr. Sherwood Gambler   Progress Note Reporting Period  12/11/18 to 01/13/2019  See note below for Objective Data and Assessment of Progress/Goals.      Encounter Date: 01/13/2019  PT End of Session - 01/13/19 1229    Visit Number  10    Date for PT Re-Evaluation  02/05/19    Authorization Type  [employer]; Medicare:  10th visit progress note;  KX at visit 15    PT Start Time  1145    PT Stop Time  1229    PT Time Calculation (min)  44 min    Activity Tolerance  Patient tolerated treatment well       Past Medical History:  Diagnosis Date  . Arthritis    shoulders  . Full dentures   . History of kidney stones   . Hyperlipidemia   . Hypertension   . Prostate cancer ([place])   . Renal calculus, left   . Vertigo    hx of, x 1 - resolved    Past Surgical History:  Procedure Laterality Date  . CARDIAC CATHETERIZATION    . CERVICAL FUSION  1998  . COLONOSCOPY  2008  . LUMBAR LAMINECTOMY/DECOMPRESSION MICRODISCECTOMY Left 06/21/2012   Procedure: LUMBAR LAMINECTOMY/DECOMPRESSION MICRODISCECTOMY 1 LEVEL;  Surgeon: Hosie Spangle, MD;  Location: [place] [facility];  Service: Neurosurgery;  Laterality: Left;  LEFT L3-4 extraforaminal microdiskectomy  . MULTIPLE TOOTH EXTRACTIONS     full dentures  . RADIOACTIVE SEED IMPLANT N/A 07/07/2018   Procedure: RADIOACTIVE SEED IMPLANT/BRACHYTHERAPY IMPLANT;  Surgeon: Kathie Rhodes, MD;  Location: [facility];  Service: Urology;  Laterality: N/A;  . SPACE OAR INSTILLATION N/A 07/07/2018   Procedure: SPACE OAR INSTILLATION;  Surgeon: Kathie Rhodes, MD;  Location: [facility];  Service: Urology;  Laterality: N/A;    There were no vitals  filed for this visit.  Subjective Assessment - 01/13/19 1147    Subjective  Not too bad today but yesterday was bad in that leg.  I'm beginning to think it's weather related.  I could barely stand on it.  I rode 15 miles on my bike over the weekend.    Pertinent History  3rd back surgery: neck '98, left back '14 DDD;  recent surgery 10/30/2018 L3-5 fusion    How long can you stand comfortably?  5-10 min 7-8/10    How long can you walk comfortably?  5 min comfortably;  pain immediately but can do 15-20 walks with [facility]   7-8/10    Patient Stated Goals  Healing; get rid of pain in lower right side    Currently in Pain?  Yes    Pain Score  6     Pain Location  Back    Pain Radiating Towards  left leg         [facility] PT Assessment - 01/13/19 0001      Observation/Other Assessments   Focus on Therapeutic Outcomes (FOTO)   46% limitation       AROM   Overall AROM Comments  100 degrees SKTC      Strength   Right Hip ABduction  4-/5    Left Hip ABduction  4-/5    Lumbar Flexion  4-/5    Lumbar Extension  4-/5      Straight Leg Raise   Findings  Negative    Comment  neural tension but no radiating pain                    [facility] Adult PT Treatment/Exercise - 01/13/19 0001      Therapeutic Activites    Therapeutic Activities  ADL's    ADL's  sit to stand, standing, walking; hip hinge/dead lifting       Lumbar Exercises: Aerobic   Stationary Bike  L1 x 10 min with discussion of status      Lumbar Exercises: Machines for Strengthening   Leg Press  seat 8:  30# single leg 10x right/left; calf press 30# 10x  right and left       Lumbar Exercises: Standing   Other Standing Lumbar Exercises  leaning forward on high table hip extension 5x2 bent and straight knee  right/left     Other Standing Lumbar Exercises  hip hinge with cane 3 points of contact, dead lift no weight fingertips to mid shin 3x5       Lumbar Exercises: Seated   Sit to Stand Limitations  holding 10# 5x       Lumbar Exercises: Supine   Other Supine Lumbar Exercises  neural gliding 10x right/left       Lumbar Exercises: Sidelying   Hip Abduction  Right;Left;5 reps      Lumbar Exercises: Prone   Straight Leg Raise  5 reps    Straight Leg Raises Limitations  over 2 pillow    Other Prone Lumbar Exercises  HS curls 5x right/left over 2 pillow       Lumbar Exercises: Quadruped   Single Arm Raise  Right;Left;5 reps    Straight Leg Raise  5 reps               PT Short Term Goals - 01/13/19 1155      PT SHORT TERM GOAL #1   Title  The patient will report understanding of basic self care and ex's to promote post surgical healing    Status  Achieved      PT SHORT TERM GOAL #2   Title  The patient will be able to walk 15 min with pain 5/10 or less    Time  4    Period  Weeks    Status  On-going      PT SHORT TERM GOAL #3   Title  The patient will report a 30% improvement in sleep; able to sleep in bed instead of the recliner    Baseline  1/2 the night in the bed    Status  Partially Met      PT SHORT TERM GOAL #4   Title  The patient will be able to stand 5-10 minutes with pain level 5/10 or less    Time  4    Period  Weeks    Status  On-going        PT Long Term Goals - 12/11/18 2028      PT LONG TERM GOAL #1   Title  The patient will be independent with safe self progression of HEP    Time  8    Period  Weeks    Status  New    Target Date  02/05/19      PT LONG TERM GOAL #2   Title  The patient will report an overall improvement in pain and function at  60%    Time  8    Period  Weeks    Status  New      PT LONG TERM GOAL #3   Title  The patient will be able to walk for 20-25 minutes with pain level 4/10 or less    Time  8    Period  Weeks    Status  New      PT LONG TERM GOAL #4   Title  The patient will have lumbo/pelvic/hip core strength grossly 4+/5 needed for stair climbing and standing longer periods of time and return to cycling.    Time  8    Period   Weeks    Status  New      PT LONG TERM GOAL #5   Title  FOTO functional outcome score improved from 59% limitation to 41% indicating improved function with less pain    Time  8    Period  Weeks    Status  New            Plan - 01/13/19 1403    Clinical Impression Statement  The patient has made signficant improvements in lumbar, abdominal and hip abduction strength to 4-/5 MMT grade.  His FOTO functional outcome score has improved from a 59% limitation to 46%.  Despite moderate complaints of left LE and back pain, the patient is able to perform an intermediate level of exercise intensity.  Seated rest breaks between standing ex's and pillow used to ensure neutral to slightly flexed spine.  He is able to increase resistance on the leg press without exacerbation of pain.  Therapist closely monitoring response with all treatment interventions.  Verbal cues for hip hinge technique.  Progressing with rehab goals although standing and walking tolerance continue to be limited.    Comorbidities  2 previous spinal surgeries: neck in '98, back '14; multi level DDD and stenosis;  HTN; history of shoulder pain    PT Frequency  2x / week    PT Duration  8 weeks    PT Treatment/Interventions  ADLs/Self Care Home Management;Electrical Stimulation;Cryotherapy;Moist Heat;Ultrasound;Neuromuscular re-education;Therapeutic exercise;Therapeutic activities;Patient/family education;Manual techniques;Taping;Dry needling    PT Next Visit Plan  standing bent over hip extension;  prone over pillow; dead lift/hip hinge;    LE and core strengthening in unloaded positions progressing toward limited weight bearing positions    PT Home Exercise Plan  Access Code: [email or web address]       Patient will benefit from skilled therapeutic intervention in order to improve the following deficits and impairments:  Difficulty walking, Increased muscle spasms, Increased fascial restricitons, Decreased activity tolerance, Pain, Decreased  strength  Visit Diagnosis: Acute right-sided low back pain without sciatica  Muscle weakness (generalized)  Difficulty in walking, not elsewhere classified     Problem List Patient Active Problem List   Diagnosis Date Noted  . Lumbar stenosis with neurogenic claudication 10/30/2018  . Cataract 06/20/2018  . Diverticulosis of colon 06/20/2018  . Essential hypertension 04/07/2018  . Carcinoma of prostate ([place]) 03/01/2018  . Malignant neoplasm of prostate ([place]) 02/28/2018  . Fecal occult blood test positive 03/11/2017  . Anal fissure 03/11/2017  . Constipation 03/11/2017  . Hypertensive left ventricular hypertrophy 03/11/2017  . Shoulder pain 03/11/2017  . Obesity 05/21/2016  . Prediabetes 05/20/2016  . Vitamin D deficiency 05/20/2016  . Primary erectile dysfunction 01/18/2016  . Palpitations 12/24/2012  . Left knee pain 08/05/2012  . Sciatica 06/11/2012  . Abnormal ECG 02/28/2009  . Mixed hyperlipidemia 02/24/2009  Gregory Marshall, PT 01/13/19 5:36 PM Phone: [phone] Fax: [phone] Gregory Marshall 01/13/2019, 5:34 PM  [facility] [address], [place], [place], [address] Phone: [phone]   Fax:  [phone]  Name: Gregory Marshall MRN: [id number] Date of Birth: 10-10-1950

## 2019-01-15 ENCOUNTER — Other Ambulatory Visit: Payer: Self-pay

## 2019-01-15 ENCOUNTER — Ambulatory Visit: Payer: Managed Care, Other (non HMO) | Admitting: Physical Therapy

## 2019-01-15 ENCOUNTER — Encounter: Payer: Self-pay | Admitting: Physical Therapy

## 2019-01-15 DIAGNOSIS — M6281 Muscle weakness (generalized): Secondary | ICD-10-CM

## 2019-01-15 DIAGNOSIS — R262 Difficulty in walking, not elsewhere classified: Secondary | ICD-10-CM

## 2019-01-15 DIAGNOSIS — M545 Low back pain, unspecified: Secondary | ICD-10-CM

## 2019-01-15 NOTE — Therapy (Addendum)
[facility] [address] [place], [place], [address] Phone: [phone]   Fax:  [phone]  Physical Therapy Treatment/Discharge Summary   Patient Details  Name: Gregory Marshall MRN: [id number] Date of Birth: [birth year] Referring Provider (PT): Dr. Sherwood Gambler    Encounter Date: 01/15/2019  PT End of Session - 01/15/19 1219    Visit Number  11    Date for PT Re-Evaluation  02/05/19    Authorization Type  [employer]; Medicare:  10th visit progress note;  KX at visit 15    PT Start Time  1145    PT Stop Time  1227    PT Time Calculation (min)  42 min    Activity Tolerance  Patient tolerated treatment well       Past Medical History:  Diagnosis Date  . Arthritis    shoulders  . Full dentures   . History of kidney stones   . Hyperlipidemia   . Hypertension   . Prostate cancer ([place])   . Renal calculus, left   . Vertigo    hx of, x 1 - resolved    Past Surgical History:  Procedure Laterality Date  . CARDIAC CATHETERIZATION    . CERVICAL FUSION  1998  . COLONOSCOPY  2008  . LUMBAR LAMINECTOMY/DECOMPRESSION MICRODISCECTOMY Left 06/21/2012   Procedure: LUMBAR LAMINECTOMY/DECOMPRESSION MICRODISCECTOMY 1 LEVEL;  Surgeon: Hosie Spangle, MD;  Location: [facility];  Service: Neurosurgery;  Laterality: Left;  LEFT L3-4 extraforaminal microdiskectomy  . MULTIPLE TOOTH EXTRACTIONS     full dentures  . RADIOACTIVE SEED IMPLANT N/A 07/07/2018   Procedure: RADIOACTIVE SEED IMPLANT/BRACHYTHERAPY IMPLANT;  Surgeon: Kathie Rhodes, MD;  Location: [facility];  Service: Urology;  Laterality: N/A;  . SPACE OAR INSTILLATION N/A 07/07/2018   Procedure: SPACE OAR INSTILLATION;  Surgeon: Kathie Rhodes, MD;  Location: [facility];  Service: Urology;  Laterality: N/A;    There were no vitals filed for this visit.  Subjective Assessment - 01/15/19 1149    Subjective  Not too good with this leg.  It's been  bad this morning.  Yesterday was pretty good though.  No patterns in what aggravates. I called the doctor's office back to see if MRI could be done before the end of the year.    Pertinent History  3rd back surgery: neck '98, left back '14 DDD;  recent surgery 10/30/2018 L3-5 fusion;  MD follow up in Jan, if no better then MRI    How long can you walk comfortably?  5 min comfortably;  pain immediately but can do 15-20 walks with SPC   7-8/10    Currently in Pain?  Yes    Pain Score  6     Pain Orientation  Left    Pain Radiating Towards  leg is more prominent today                       [facility] Adult PT Treatment/Exercise - 01/15/19 0001      Therapeutic Activites    Therapeutic Activities  ADL's    ADL's  sit to stand, standing, walking; hip hinge/dead lifting       Lumbar Exercises: Stretches   Single Knee to Chest Stretch  Right;Left;5 reps    Double Knee to Chest Stretch  5 reps    Other Lumbar Stretch Exercise  right and left sciatic floss in supine 10x each side       Lumbar Exercises:  Aerobic   Stationary Bike  L1 x 10 min with discussion of status      Lumbar Exercises: Machines for Strengthening   Leg Press  seat 8:  30# single leg 10x right/left; calf press 30# 10x  right and left       Lumbar Exercises: Standing   Other Standing Lumbar Exercises  leaning forward on high table hip extension 5x2 bent and straight knee  right/left       Lumbar Exercises: Seated   Sit to Stand Limitations  holding 10# 10x    Other Seated Lumbar Exercises  seated hip hinge/dead lift holding blue band 10x       Lumbar Exercises: Supine   Clam  20 reps    Clam Limitations  blue band     Other Supine Lumbar Exercises  green band UE extension pull downs 20x single and double        Lumbar Exercises: Prone   Other Prone Lumbar Exercises  HS curls 5x right/left over 2 pillow       Lumbar Exercises: Quadruped   Single Arm Raise  Right;Left;5 reps    Straight Leg Raise  5 reps                PT Short Term Goals - 01/13/19 1155      PT SHORT TERM GOAL #1   Title  The patient will report understanding of basic self care and ex's to promote post surgical healing    Status  Achieved      PT SHORT TERM GOAL #2   Title  The patient will be able to walk 15 min with pain 5/10 or less    Time  4    Period  Weeks    Status  On-going      PT SHORT TERM GOAL #3   Title  The patient will report a 30% improvement in sleep; able to sleep in bed instead of the recliner    Baseline  1/2 the night in the bed    Status  Partially Met      PT SHORT TERM GOAL #4   Title  The patient will be able to stand 5-10 minutes with pain level 5/10 or less    Time  4    Period  Weeks    Status  On-going        PT Long Term Goals - 12/11/18 2028      PT LONG TERM GOAL #1   Title  The patient will be independent with safe self progression of HEP    Time  8    Period  Weeks    Status  New    Target Date  02/05/19      PT LONG TERM GOAL #2   Title  The patient will report an overall improvement in pain and function at 60%    Time  8    Period  Weeks    Status  New      PT LONG TERM GOAL #3   Title  The patient will be able to walk for 20-25 minutes with pain level 4/10 or less    Time  8    Period  Weeks    Status  New      PT LONG TERM GOAL #4   Title  The patient will have lumbo/pelvic/hip core strength grossly 4+/5 needed for stair climbing and standing longer periods of time and return to cycling.  Time  8    Period  Weeks    Status  New      PT LONG TERM GOAL #5   Title  FOTO functional outcome score improved from 59% limitation to 41% indicating improved function with less pain    Time  8    Period  Weeks    Status  New            Plan - 01/15/19 1219    Clinical Impression Statement  The patient continues to have left LE pain in addition to low back pain worsened with standing and walking.  He is able to perform seated dead lifts with  resistance without pain exacerbation.  Modified ex's in response to pain including limiting standing and walking ex's secondary to pain.  Discontinued quadruped UE/LE lifts secondary to knee pain.  Verbal cues for hip hinge technique.  Post treatment session, he reports his leg feels a little bit better.  Therapist closely monitoring response to all treatment interventions and modifying as needed.    Comorbidities  2 previous spinal surgeries: neck in '98, back '14; multi level DDD and stenosis;  HTN; history of shoulder pain    Rehab Potential  Good    PT Frequency  2x / week    PT Duration  8 weeks    PT Treatment/Interventions  ADLs/Self Care Home Management;Electrical Stimulation;Cryotherapy;Moist Heat;Ultrasound;Neuromuscular re-education;Therapeutic exercise;Therapeutic activities;Patient/family education;Manual techniques;Taping;Dry needling    PT Next Visit Plan  standing bent over hip extension;  prone over pillow; dead lift/hip hinge;    LE and core strengthening in unloaded positions progressing toward limited weight bearing positions    PT Home Exercise Plan  Access Code: [email or web address]       Patient will benefit from skilled therapeutic intervention in order to improve the following deficits and impairments:  Difficulty walking, Increased muscle spasms, Increased fascial restricitons, Decreased activity tolerance, Pain, Decreased strength  Visit Diagnosis: Acute right-sided low back pain without sciatica  Muscle weakness (generalized)  Difficulty in walking, not elsewhere classified    PHYSICAL THERAPY DISCHARGE SUMMARY  Visits from Start of Care: 11  Current functional level related to goals / functional outcomes: The patient requested discharge from PT.  No reason given.   Remaining deficits: See clinical impressions.   Education / Equipment: HEP Plan: Patient agrees to discharge.  Patient goals were partially met. Patient is being discharged due to the patient's request.   ?????         Problem List Patient Active Problem List   Diagnosis Date Noted  . Lumbar stenosis with neurogenic claudication 10/30/2018  . Cataract 06/20/2018  . Diverticulosis of colon 06/20/2018  . Essential hypertension 04/07/2018  . Carcinoma of prostate ([place]) 03/01/2018  . Malignant neoplasm of prostate ([place]) 02/28/2018  . Fecal occult blood test positive 03/11/2017  . Anal fissure 03/11/2017  . Constipation 03/11/2017  . Hypertensive left ventricular hypertrophy 03/11/2017  . Shoulder pain 03/11/2017  . Obesity 05/21/2016  . Prediabetes 05/20/2016  . Vitamin D deficiency 05/20/2016  . Primary erectile dysfunction 01/18/2016  . Palpitations 12/24/2012  . Left knee pain 08/05/2012  . Sciatica 06/11/2012  . Abnormal ECG 02/28/2009  . Mixed hyperlipidemia 02/24/2009   Ruben Im, PT 01/15/19 1:58 PM Phone: [phone] Fax: [phone] Alvera Singh 01/15/2019, 1:57 PM  [facility] [address] [place], [place], [address] Phone: [phone]   Fax:  [phone]  Name: Abdulrahim Siddiqi MRN: [id number] Date of Birth: 30-Oct-1950

## 2019-01-20 ENCOUNTER — Encounter: Payer: Managed Care, Other (non HMO) | Admitting: Physical Therapy

## 2019-01-22 ENCOUNTER — Encounter: Payer: Managed Care, Other (non HMO) | Admitting: Physical Therapy

## 2019-01-27 ENCOUNTER — Ambulatory Visit: Payer: Managed Care, Other (non HMO) | Admitting: Physical Therapy

## 2019-03-07 DIAGNOSIS — D649 Anemia, unspecified: Secondary | ICD-10-CM | POA: Insufficient documentation

## 2019-06-19 ENCOUNTER — Other Ambulatory Visit: Payer: Self-pay | Admitting: Neurosurgery

## 2019-06-19 ENCOUNTER — Telehealth: Payer: Self-pay | Admitting: Nurse Practitioner

## 2019-06-19 DIAGNOSIS — M5416 Radiculopathy, lumbar region: Secondary | ICD-10-CM

## 2019-06-19 NOTE — Telephone Encounter (Signed)
Phone call to patient to verify medication list and allergies for myelogram procedure. Pt instructed to hold tramadol for 48hrs prior to myelogram appointment time. Pt verbalized understanding. Pre and post procedure instructions reviewed with pt. 

## 2019-07-03 ENCOUNTER — Other Ambulatory Visit: Payer: Managed Care, Other (non HMO)

## 2019-07-03 ENCOUNTER — Ambulatory Visit
Admission: RE | Admit: 2019-07-03 | Discharge: 2019-07-03 | Disposition: A | Discharge status: Home / Self Care | Payer: Managed Care, Other (non HMO) | Source: Ambulatory Visit | Attending: Neurosurgery | Admitting: Neurosurgery

## 2019-07-03 ENCOUNTER — Other Ambulatory Visit: Payer: Self-pay

## 2019-07-03 DIAGNOSIS — M5416 Radiculopathy, lumbar region: Secondary | ICD-10-CM

## 2019-07-03 MED ORDER — IOPAMIDOL (ISOVUE-M 200) INJECTION 41%
18.0000 mL | Freq: Once | INTRAMUSCULAR | Status: AC
  Administered 2019-07-03: 18 mL via INTRATHECAL

## 2019-07-03 MED ORDER — DIAZEPAM 5 MG PO TABS
5.0000 mg | ORAL_TABLET | Freq: Once | ORAL | Status: AC
  Administered 2019-07-03: 5 mg via ORAL

## 2019-07-03 NOTE — Discharge Instructions (Signed)

## 2019-07-11 ENCOUNTER — Ambulatory Visit: Payer: Self-pay

## 2019-07-11 ENCOUNTER — Ambulatory Visit
Admission: EM | Admit: 2019-07-11 | Discharge: 2019-07-11 | Disposition: A | Discharge status: Home / Self Care | Payer: Managed Care, Other (non HMO) | Attending: Physician Assistant | Admitting: Physician Assistant

## 2019-07-11 ENCOUNTER — Ambulatory Visit (INDEPENDENT_AMBULATORY_CARE_PROVIDER_SITE_OTHER): Payer: Managed Care, Other (non HMO)

## 2019-07-11 DIAGNOSIS — W19XXXA Unspecified fall, initial encounter: Secondary | ICD-10-CM

## 2019-07-11 DIAGNOSIS — M25512 Pain in left shoulder: Secondary | ICD-10-CM

## 2019-07-11 MED ORDER — MELOXICAM 7.5 MG PO TABS
7.5000 mg | ORAL_TABLET | Freq: Every day | ORAL | 0 refills | Status: DC
Start: 2019-07-11 — End: 2021-05-01

## 2019-07-11 NOTE — Discharge Instructions (Signed)
X-ray negative for fracture or dislocation. Start Mobic. Do not take ibuprofen (motrin/advil)/ naproxen (aleve) while on mobic. Ice/heat compresses as needed. This can take up to 3-4 weeks to completely resolve, but you should be feeling better each week. Follow up with PCP/orthopedics if symptoms worsen, changes for reevaluation.

## 2019-07-11 NOTE — ED Provider Notes (Signed)
[facility]    CSN: [id number] Arrival date & time: 07/11/19  0856      History   Chief Complaint Chief Complaint  Patient presents with  . Shoulder Injury    Appointment    HPI Gregory Marshall is a 69 y.o. male.   69 year old male comes in for 2 day history of left shoulder pain after injury. States stripped and fell, landing on left side of the shoulder. Pain is to the superior shoulder, worse with movement. Denies radiation of pain. Denies joint swelling, bruising. Denies numbness/tingling. Took tylenol without relief.     Past Medical History:  Diagnosis Date  . Arthritis    shoulders  . Full dentures   . History of kidney stones   . Hyperlipidemia   . Hypertension   . Prostate cancer ([place])   . Renal calculus, left   . Vertigo    hx of, x 1 - resolved    Patient Active Problem List   Diagnosis Date Noted  . Lumbar stenosis with neurogenic claudication 10/30/2018  . Cataract 06/20/2018  . Diverticulosis of colon 06/20/2018  . Essential hypertension 04/07/2018  . Carcinoma of prostate ([place]) 03/01/2018  . Malignant neoplasm of prostate ([place]) 02/28/2018  . Fecal occult blood test positive 03/11/2017  . Anal fissure 03/11/2017  . Constipation 03/11/2017  . Hypertensive left ventricular hypertrophy 03/11/2017  . Shoulder pain 03/11/2017  . Obesity 05/21/2016  . Prediabetes 05/20/2016  . Vitamin D deficiency 05/20/2016  . Primary erectile dysfunction 01/18/2016  . Palpitations 12/24/2012  . Left knee pain 08/05/2012  . Sciatica 06/11/2012  . Abnormal ECG 02/28/2009  . Mixed hyperlipidemia 02/24/2009    Past Surgical History:  Procedure Laterality Date  . CARDIAC CATHETERIZATION    . CERVICAL FUSION  1998  . COLONOSCOPY  2008  . LUMBAR LAMINECTOMY/DECOMPRESSION MICRODISCECTOMY Left 06/21/2012   Procedure: LUMBAR LAMINECTOMY/DECOMPRESSION MICRODISCECTOMY 1 LEVEL;  Surgeon: Hosie Spangle, MD;  Location: [facility];  Service:  Neurosurgery;  Laterality: Left;  LEFT L3-4 extraforaminal microdiskectomy  . MULTIPLE TOOTH EXTRACTIONS     full dentures  . RADIOACTIVE SEED IMPLANT N/A 07/07/2018   Procedure: RADIOACTIVE SEED IMPLANT/BRACHYTHERAPY IMPLANT;  Surgeon: Kathie Rhodes, MD;  Location: [facility];  Service: Urology;  Laterality: N/A;  . SPACE OAR INSTILLATION N/A 07/07/2018   Procedure: SPACE OAR INSTILLATION;  Surgeon: Kathie Rhodes, MD;  Location: [facility];  Service: Urology;  Laterality: N/A;       Home Medications    Prior to Admission medications   Medication Sig Start Date End Date Taking? Authorizing Provider  amLODipine (NORVASC) 2.5 MG tablet Take 2.5 mg by mouth daily.     [provider]  ferrous sulfate 325 (65 FE) MG tablet Take 325 mg by mouth daily with breakfast.    [provider]  gabapentin (NEURONTIN) 100 MG capsule Take 100 mg by mouth 3 (three) times daily.    [provider]  HYDROcodone-acetaminophen (NORCO/VICODIN) 5-325 MG tablet Take 1-2 tablets by mouth every 4 (four) hours as needed (pain). Patient not taking: Reported on 12/11/2018 10/31/18   Jovita Gamma, MD  meloxicam (MOBIC) 7.5 MG tablet Take 1 tablet (7.5 mg total) by mouth daily. 07/11/19   Tasia Catchings, Dyron Kawano V, PA-C  rosuvastatin (CRESTOR) 10 MG tablet Take 10 mg by mouth at bedtime.    [provider]  traMADol (ULTRAM) 50 MG tablet Take by mouth every 6 (six) hours as needed.  [provider]    Family History Family History  Problem Relation Age of Onset  . Alzheimer's disease Mother   . Heart disease Brother 77       CABG  . Prostate cancer Maternal Uncle   . Prostate cancer Cousin        paternal  . Prostate cancer Cousin        paternal  . Colon cancer Neg Hx   . Colon polyps Neg Hx   . Rectal cancer Neg Hx   . Stomach cancer Neg Hx     Social History Social History   Tobacco Use  . Smoking status: Never Smoker  . Smokeless  tobacco: Never Used  Vaping Use  . Vaping Use: Never used  Substance Use Topics  . Alcohol use: Not Currently    Alcohol/week: 0.0 standard drinks  . Drug use: Never     Allergies   Patient has no known allergies.   Review of Systems Review of Systems  Reason unable to perform ROS: See HPI as above.     Physical Exam Triage Vital Signs ED Triage Vitals  Enc Vitals Group     BP 07/11/19 0941 120/74     Pulse Rate 07/11/19 0941 60     Resp 07/11/19 0941 14     Temp 07/11/19 0941 98.2 F (36.8 C)     Temp Source 07/11/19 0941 Oral     SpO2 07/11/19 0941 97 %     Weight --      Height --      Head Circumference --      Peak Flow --      Pain Score 07/11/19 0938 10     Pain Loc --      Pain Edu? --      Excl. in [place]? --    No data found.  Updated Vital Signs BP 120/74 (BP Location: Left Arm)   Pulse 60   Temp 98.2 F (36.8 C) (Oral)   Resp 14   SpO2 97%   Visual Acuity Right Eye Distance:   Left Eye Distance:   Bilateral Distance:    Right Eye Near:   Left Eye Near:    Bilateral Near:     Physical Exam Constitutional:      General: He is not in acute distress.    Appearance: He is well-developed. He is not diaphoretic.  HENT:     Head: Normocephalic and atraumatic.  Eyes:     Conjunctiva/sclera: Conjunctivae normal.     Pupils: Pupils are equal, round, and reactive to light.  Cardiovascular:     Rate and Rhythm: Normal rate and regular rhythm.     Heart sounds: Normal heart sounds. No murmur heard.  No friction rub. No gallop.   Pulmonary:     Effort: Pulmonary effort is normal. No accessory muscle usage or respiratory distress.     Breath sounds: Normal breath sounds. No stridor. No decreased breath sounds, wheezing, rhonchi or rales.  Musculoskeletal:     Comments: No tenderness on palpation of the spinous processes. No tenderness to clavicle. Tenderness diffusely to the left thoracic back, shoulder. Decreased ROM of the shoulder. Elbow/grip  strength 5/5. Radial pulse 2+  Skin:    General: Skin is warm and dry.  Neurological:     Mental Status: He is alert and oriented to person, place, and time.      UC Treatments / Results  Labs (all labs ordered are listed, but only  abnormal results are displayed) Labs Reviewed - No data to display  EKG   Radiology DG Shoulder Left  Result Date: 07/11/2019 CLINICAL DATA:  Left shoulder pain post fall yesterday. EXAM: LEFT SHOULDER - 2+ VIEW COMPARISON:  02/05/2009 FINDINGS: Degenerative change of the [facility] joint and glenohumeral joints. No evidence of acute fracture or dislocation. Fusion hardware over the cervical spine intact. IMPRESSION: 1.  No acute findings. 2.  Degenerative changes of the left shoulder. Electronically Signed   By: Marin Olp M.D.   On: 07/11/2019 11:03    Procedures Procedures (including critical care time)  Medications Ordered in UC Medications - No data to display  Initial Impression / Assessment and Plan / UC Course  I have reviewed the triage vital signs and the nursing notes.  Pertinent labs & imaging results that were available during my care of the patient were reviewed by me and considered in my medical decision making (see chart for details).    Xray negative for fracture or dislocation. NSAIDs, ice compress, rest. Expected  Course of healing discussed. Return precautions given.  Final Clinical Impressions(s) / UC Diagnoses   Final diagnoses:  Acute pain of left shoulder   ED Prescriptions    Medication Sig Dispense Auth. Provider   meloxicam (MOBIC) 7.5 MG tablet Take 1 tablet (7.5 mg total) by mouth daily. 15 tablet Ok Edwards, PA-C     PDMP not reviewed this encounter.   Ok Edwards, PA-C 07/11/19 1511

## 2019-07-11 NOTE — ED Triage Notes (Signed)
Patient is here for left shoulder pain/injury that occurred yesterday. He states he fell onto his left side while moving some things around outside.

## 2020-06-18 IMAGING — XA DG MYELOGRAPHY LUMBAR INJ LUMBOSACRAL
12 series · 12 of 12 positions shown · non-contrast
Comparison: MRI of the lumbar spine 06/15/2012. Intraoperative
radiographs of the lumbar spine 10/30/18

CLINICAL DATA: Low back pain. Left lower extremity pain in the
anterior thigh and anterior lower extremity.
TECHNIQUE: Contiguous axial images were obtained through the Lumbar spine after
the intrathecal infusion of infusion. Coronal and sagittal
reconstructions were obtained of the axial image sets.

[Series 1: vasc adipose · 1 of 1 slices shown (1 of 9)]
[im 1/1]
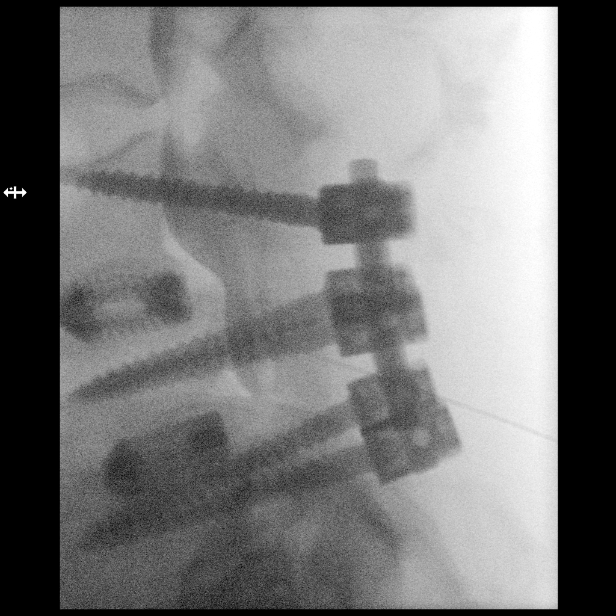

[Series 1: w lumbar spine lat · 0.15mm/px · 1 of 1 slices shown]
[im 1/1]
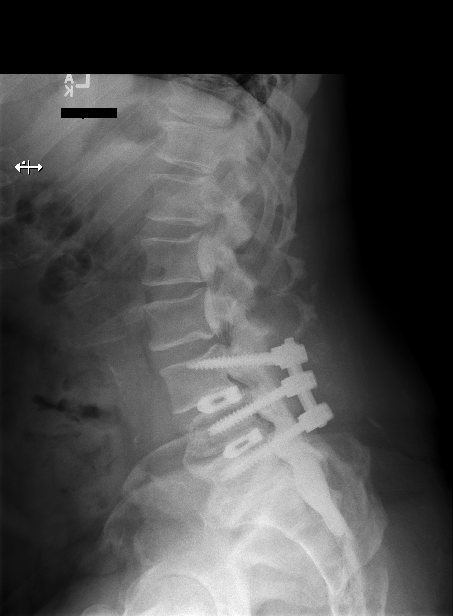

[Series 2: w lumbar spine flexion · 0.15mm/px · 1 of 1 slices shown]
[im 1/1]
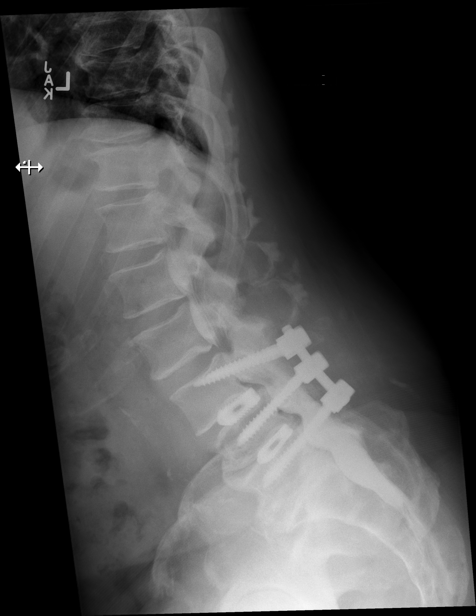

[Series 2: vasc adipose · 1 of 1 slices shown (2 of 9)]
[im 1/1]
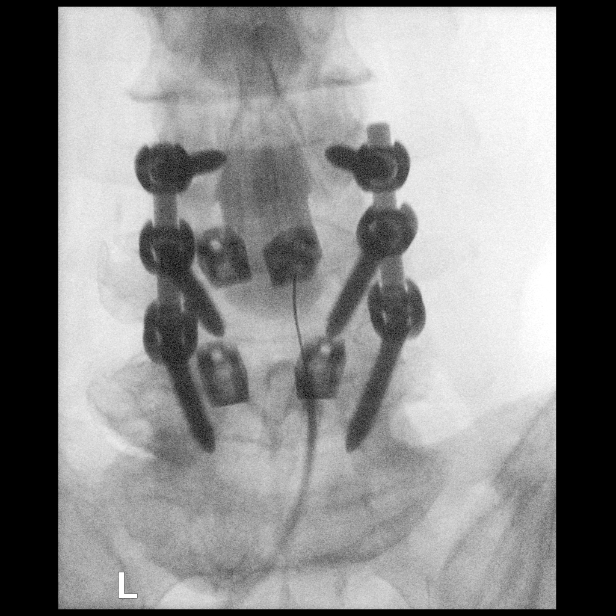

[Series 3: w lumbar spine extension · 0.15mm/px · 1 of 1 slices shown]
[im 1/1]
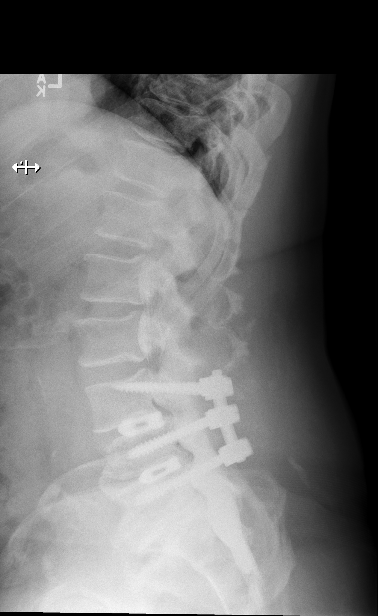

[Series 3: vasc adipose · 1 of 1 slices shown (3 of 9)]
[im 1/1]
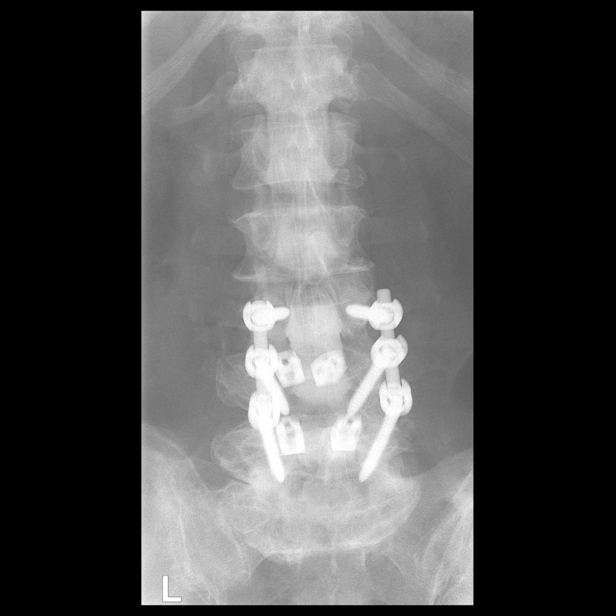

[Series 4: vasc adipose · 1 of 1 slices shown (4 of 9)]
[im 1/1]
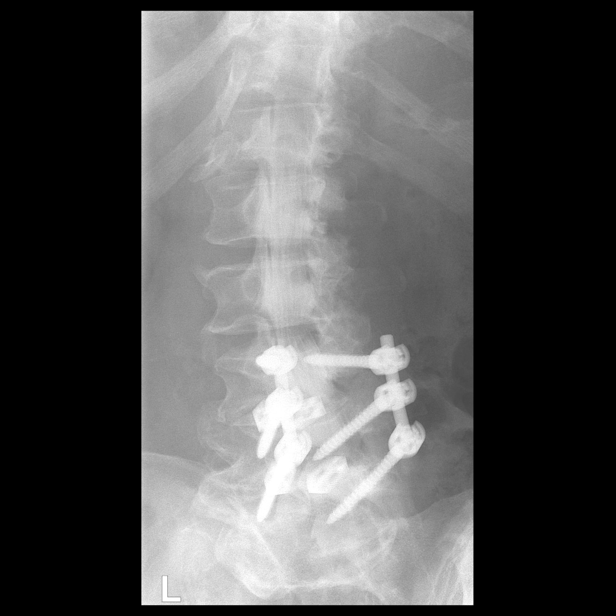

[Series 5: vasc adipose · 1 of 1 slices shown (5 of 9)]
[im 1/1]
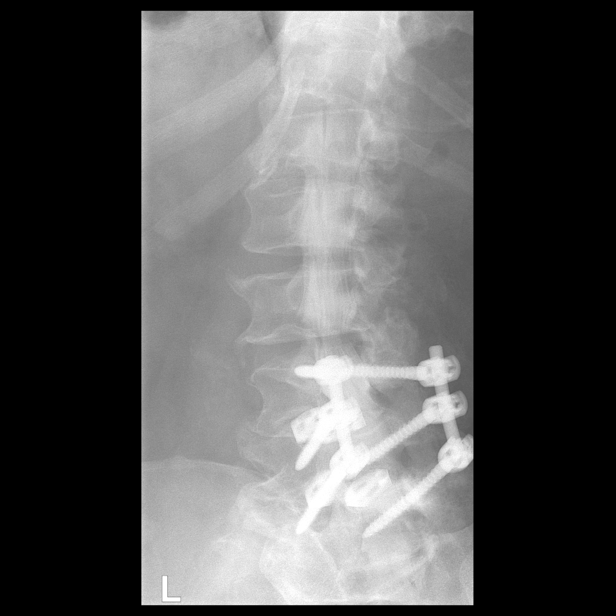

[Series 6: vasc adipose · 1 of 1 slices shown (6 of 9)]
[im 1/1]
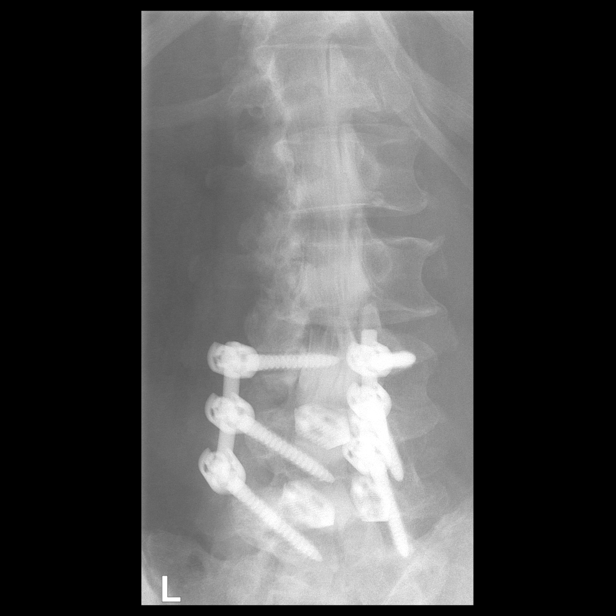

[Series 7: vasc adipose · 1 of 1 slices shown (7 of 9)]
[im 1/1]
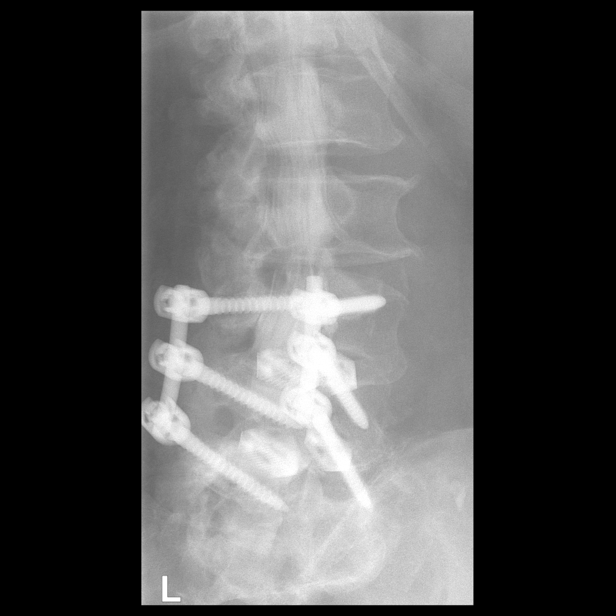

[Series 8: vasc adipose · 1 of 1 slices shown (8 of 9)]
[im 1/1]
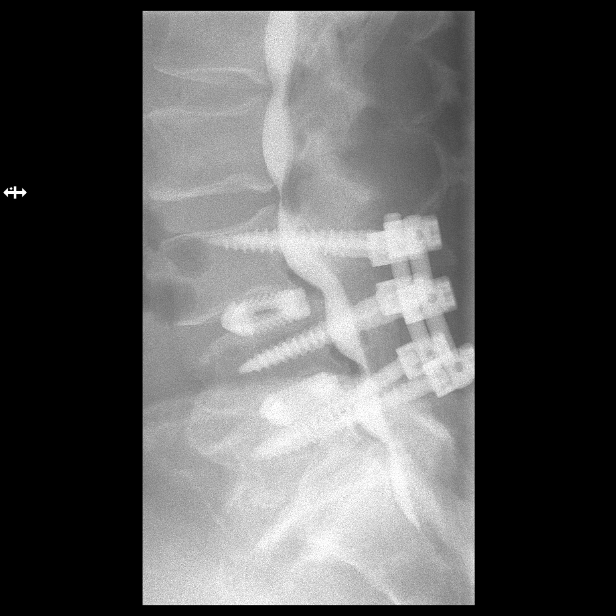

[Series 9: vasc adipose · 1 of 1 slices shown (9 of 9)]
[im 1/1]
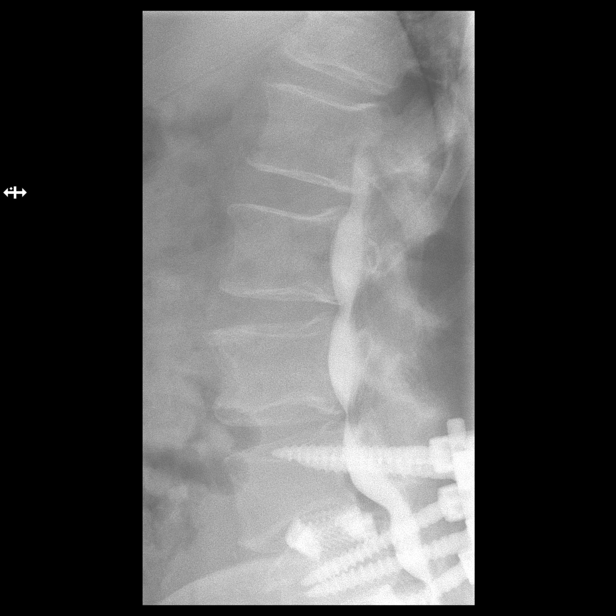

[12 of 12 positions shown; findings below may reference images not displayed]

EXAM:
LUMBAR MYELOGRAM

FLUOROSCOPY TIME:  Radiation Exposure Index (as provided by the
fluoroscopic device): 220.94 uGy*m2

PROCEDURE:
After thorough discussion of risks and benefits of the procedure
including bleeding, infection, injury to nerves, blood vessels,
adjacent structures as well as headache and CSF leak, written and
oral informed consent was obtained. Consent was obtained by Dr.
Mrcocktailman Issekrud. Time out form was completed.

Patient was positioned prone on the fluoroscopy table. Local
anesthesia was provided with 1% lidocaine without epinephrine after
prepped and draped in the usual sterile fashion. Puncture was
performed at L5-S1 using a 3 1/2 inch 22-gauge spinal needle via
right paramedian approach. Using a single pass through the dura, the
needle was placed within the thecal sac, with return of clear CSF.
15 mL of Isovue [id number] was injected into the thecal sac, with normal
opacification of the nerve roots and cauda equina consistent with
free flow within the subarachnoid space.

I personally performed the lumbar puncture and administered the
intrathecal contrast. I also personally supervised acquisition of
the myelogram images.
FINDINGS: LUMBAR MYELOGRAM FINDINGS:

Posterior pedicle and screw fixation present at L3-4 and L4-5.
Adjacent level disease is present at L2-3 mild disc bulging is
present at L1-2. Minimal anterolisthesis is present flexion at L3-4.

Moderate central canal stenosis is present at L3-4, left greater
than right

CT LUMBAR MYELOGRAM FINDINGS:

The lumbar spine is imaged from T11-12 through S2. Conus medullaris
terminates at L1 solid fusion is present L3-4 and L4-5. Anterior
osteophytes are fused at L5-S1. Grade 1 anterolisthesis at L3-4
measures 6 mm. Alignment is otherwise anatomic.

Atherosclerotic calcifications are present the aorta and branch
vessels. Iliac arteries measure up to 18 mm on the left 15 mm on the
right. Aortic aneurysm present. Low-density 2.0 cm lesion at the
upper pole of the left kidney likely represents a cyst.

L1-2: Facet hypertrophy is present. Mild disc bulging is noted.
Central canal is patent. Foraminal narrowing is present bilaterally.

L2-3: A broad-based disc protrusion is present. Moderate facet
hypertrophy is noted bilaterally. Results in moderate central
bilateral foraminal narrowing, right greater than left.

L3-4: Wide laminectomy present. Solid fusion is noted. No residual
or recurrent stenosis present.

L4-5: A wide laminectomy is present. Fusion is noted. No significant
that no residual or recurrent stenosis present.

L5-S1: Broad-based protrusion extends into the foramina bilaterally.
Moderate foraminal narrowing is present, left greater than right.
The central canal is patent.
IMPRESSION: 1. Solid fusion at L3-4 and L4-5 without significant residual or
recurrent stenosis at these levels.
2. Adjacent level disease at L2-3 with moderate central canal
stenosis, right greater than left.
3. Foraminal narrowing bilaterally at L1-2.
4. Moderate foraminal narrowing at L5-S1 is worse on the left.
5. Aortic Atherosclerosis ([id number]).
6. Mild aneurysmal dilation of the proximal iliac arteries
bilaterally.

## 2020-12-02 DIAGNOSIS — G8929 Other chronic pain: Secondary | ICD-10-CM | POA: Insufficient documentation

## 2021-03-06 DIAGNOSIS — N200 Calculus of kidney: Secondary | ICD-10-CM | POA: Insufficient documentation

## 2021-05-01 ENCOUNTER — Encounter (HOSPITAL_COMMUNITY): Payer: Self-pay

## 2021-05-01 ENCOUNTER — Ambulatory Visit (INDEPENDENT_AMBULATORY_CARE_PROVIDER_SITE_OTHER): Payer: Managed Care, Other (non HMO)

## 2021-05-01 ENCOUNTER — Ambulatory Visit (HOSPITAL_COMMUNITY)
Admission: EM | Admit: 2021-05-01 | Discharge: 2021-05-01 | Disposition: A | Discharge status: Home / Self Care | Payer: Managed Care, Other (non HMO) | Attending: Family Medicine | Admitting: Family Medicine

## 2021-05-01 DIAGNOSIS — M25511 Pain in right shoulder: Secondary | ICD-10-CM

## 2021-05-01 MED ORDER — KETOROLAC TROMETHAMINE 30 MG/ML IJ SOLN
INTRAMUSCULAR | Status: AC
  Filled 2021-05-01: qty 1

## 2021-05-01 MED ORDER — KETOROLAC TROMETHAMINE 30 MG/ML IJ SOLN
30.0000 mg | Freq: Once | INTRAMUSCULAR | Status: AC
  Administered 2021-05-01: 30 mg via INTRAMUSCULAR

## 2021-05-01 MED ORDER — MELOXICAM 7.5 MG PO TABS: 7.5000 mg | ORAL_TABLET | Freq: Every day | ORAL | 2 refills | Status: AC

## 2021-05-01 NOTE — ED Provider Notes (Signed)
?[facility] ? ? ? ?CSN: [id number] ?Arrival date & time: 05/01/21  1548 ? ? ?  ? ?History   ?Chief Complaint ?Chief Complaint  ?Patient presents with  ? Shoulder Pain  ? ? ?HPI ?Gregory Marshall is a 71 y.o. male.  ? ? ?Shoulder Pain ?Here for 2-week history of right shoulder pain.  No known trauma ?No fever or rash.  No cough or cold symptoms.  No paresthesias in his hand ? ? ?Past Medical History:  ?Diagnosis Date  ? Arthritis   ? shoulders  ? Full dentures   ? History of kidney stones   ? Hyperlipidemia   ? Hypertension   ? Prostate cancer ([place])   ? Renal calculus, left   ? Vertigo   ? hx of, x 1 - resolved  ? ? ?Patient Active Problem List  ? Diagnosis Date Noted  ? Lumbar stenosis with neurogenic claudication 10/30/2018  ? Cataract 06/20/2018  ? Diverticulosis of colon 06/20/2018  ? Essential hypertension 04/07/2018  ? Carcinoma of prostate ([place]) 03/01/2018  ? Malignant neoplasm of prostate ([place]) 02/28/2018  ? Fecal occult blood test positive 03/11/2017  ? Anal fissure 03/11/2017  ? Constipation 03/11/2017  ? Hypertensive left ventricular hypertrophy 03/11/2017  ? Shoulder pain 03/11/2017  ? Obesity 05/21/2016  ? Prediabetes 05/20/2016  ? Vitamin D deficiency 05/20/2016  ? Primary erectile dysfunction 01/18/2016  ? Palpitations 12/24/2012  ? Left knee pain 08/05/2012  ? Sciatica 06/11/2012  ? Abnormal ECG 02/28/2009  ? Mixed hyperlipidemia 02/24/2009  ? ? ?Past Surgical History:  ?Procedure Laterality Date  ? CARDIAC CATHETERIZATION    ? CERVICAL FUSION  1998  ? COLONOSCOPY  2008  ? LUMBAR LAMINECTOMY/DECOMPRESSION MICRODISCECTOMY Left 06/21/2012  ? Procedure: LUMBAR LAMINECTOMY/DECOMPRESSION MICRODISCECTOMY 1 LEVEL;  Surgeon: Hosie Spangle, MD;  Location: [facility];  Service: Neurosurgery;  Laterality: Left;  LEFT L3-4 extraforaminal microdiskectomy  ? MULTIPLE TOOTH EXTRACTIONS    ? full dentures  ? RADIOACTIVE SEED IMPLANT N/A 07/07/2018  ? Procedure: RADIOACTIVE SEED IMPLANT/BRACHYTHERAPY  IMPLANT;  Surgeon: Kathie Rhodes, MD;  Location: [facility];  Service: Urology;  Laterality: N/A;  ? SPACE OAR INSTILLATION N/A 07/07/2018  ? Procedure: SPACE OAR INSTILLATION;  Surgeon: Kathie Rhodes, MD;  Location: [facility];  Service: Urology;  Laterality: N/A;  ? ? ? ? ? ?Home Medications   ? ?Prior to Admission medications   ?Medication Sig Start Date End Date Taking? Authorizing Provider  ?meloxicam (MOBIC) 7.5 MG tablet Take 1 tablet (7.5 mg total) by mouth daily. 05/01/21  Yes Barrett Henle, MD  ?amLODipine (NORVASC) 2.5 MG tablet Take 2.5 mg by mouth daily.     [provider]  ?ferrous sulfate 325 (65 FE) MG tablet Take 325 mg by mouth daily with breakfast.    [provider]  ?gabapentin (NEURONTIN) 100 MG capsule Take 100 mg by mouth 3 (three) times daily.    [provider]  ?rosuvastatin (CRESTOR) 10 MG tablet Take 10 mg by mouth at bedtime.    [provider]  ?traMADol (ULTRAM) 50 MG tablet Take by mouth every 6 (six) hours as needed.    [provider]  ? ? ?Family History ?Family History  ?Problem Relation Age of Onset  ? Alzheimer's disease Mother   ? Heart disease Brother 28  ?     CABG  ? Prostate cancer Maternal Uncle   ? Prostate cancer Cousin   ?     paternal  ?  Prostate cancer Cousin   ?     paternal  ? Colon cancer Neg Hx   ? Colon polyps Neg Hx   ? Rectal cancer Neg Hx   ? Stomach cancer Neg Hx   ? ? ?Social History ?Social History  ? ?Tobacco Use  ? Smoking status: Never  ? Smokeless tobacco: Never  ?Vaping Use  ? Vaping Use: Never used  ?Substance Use Topics  ? Alcohol use: Not Currently  ?  Alcohol/week: 0.0 standard drinks  ? Drug use: Never  ? ? ? ?Allergies   ?Patient has no known allergies. ? ? ?Review of Systems ?Review of Systems ? ? ?Physical Exam ?Triage Vital Signs ?ED Triage Vitals  ?Enc Vitals Group  ?   BP 05/01/21 1827 131/78  ?   Pulse Rate 05/01/21 1827 (!) 58  ?   Resp 05/01/21 1827 18  ?    Temp 05/01/21 1827 98 ?F (36.7 ?C)  ?   Temp Source 05/01/21 1827 Oral  ?   SpO2 05/01/21 1827 95 %  ?   Weight --   ?   Height --   ?   Head Circumference --   ?   Peak Flow --   ?   Pain Score 05/01/21 1826 2  ?   Pain Loc --   ?   Pain Edu? --   ?   Excl. in [place]? --   ? ?No data found. ? ?Updated Vital Signs ?BP 131/78 (BP Location: Right Arm)   Pulse (!) 58   Temp 98 ?F (36.7 ?C) (Oral)   Resp 18   SpO2 95%  ? ?Visual Acuity ?Right Eye Distance:   ?Left Eye Distance:   ?Bilateral Distance:   ? ?Right Eye Near:   ?Left Eye Near:    ?Bilateral Near:    ? ?Physical Exam ?Vitals reviewed.  ?Constitutional:   ?   General: He is not in acute distress. ?   Appearance: He is not toxic-appearing.  ?Cardiovascular:  ?   Rate and Rhythm: Normal rate and regular rhythm.  ?Pulmonary:  ?   Effort: Pulmonary effort is normal.  ?   Breath sounds: Normal breath sounds.  ?Musculoskeletal:     ?   General: No tenderness.  ?   Comments: Overall good range of motion of the shoulder.  He has pain when he forward flexes his shoulder the right  ?Skin: ?   Coloration: Skin is not jaundiced or pale.  ?Neurological:  ?   General: No focal deficit present.  ?   Mental Status: He is alert.  ?Psychiatric:     ?   Behavior: Behavior normal.  ? ? ? ?UC Treatments / Results  ?Labs ?(all labs ordered are listed, but only abnormal results are displayed) ?Labs Reviewed - No data to display ? ?EKG ? ? ?Radiology ?DG Shoulder Right ? ?Result Date: 05/01/2021 ?CLINICAL DATA:  Right shoulder pain EXAM: RIGHT SHOULDER - 2+ VIEW COMPARISON:  None. FINDINGS: Degenerative changes in the [facility] joint with joint space narrowing and spurring. Glenohumeral joint is maintained. No acute bony abnormality. Specifically, no fracture, subluxation, or dislocation. Soft tissues are intact. IMPRESSION: Moderate degenerative changes in the right AC joint. No acute bony abnormality. Electronically Signed   By: Rolm Baptise M.D.   On: 05/01/2021 19:06    ? ?Procedures ?Procedures (including critical care time) ? ?Medications Ordered in UC ?Medications  ?ketorolac (TORADOL) 30 MG/ML injection 30 mg (has no administration in time  range)  ? ? ?Initial Impression / Assessment and Plan / UC Course  ?I have reviewed the triage vital signs and the nursing notes. ? ?Pertinent labs & imaging results that were available during my care of the patient were reviewed by me and considered in my medical decision making (see chart for details). ? ?  ? ?Xray shows some mild degenerative changes  ?Final Clinical Impressions(s) / UC Diagnoses  ? ?Final diagnoses:  ?Acute pain of right shoulder  ? ? ? ?Discharge Instructions   ? ?  ?The x-rays did not show any broken bones, but there was some arthritis ? ?You have been given a shot of Toradol 30 mg here in the office. ? ?Take meloxicam 7.5 mg--1 daily for pain. ? ?See your primary care office later this month about this problem especially if not improving ? ? ? ? ?ED Prescriptions   ? ? Medication Sig Dispense Auth. Provider  ? meloxicam (MOBIC) 7.5 MG tablet Take 1 tablet (7.5 mg total) by mouth daily. 30 tablet Barrett Henle, MD  ? ?  ? ?PDMP not reviewed this encounter. ?  ?Barrett Henle, MD ?05/01/21 1931 ? ?

## 2021-05-01 NOTE — Discharge Instructions (Addendum)
The x-rays did not show any broken bones, but there was some arthritis ? ?You have been given a shot of Toradol 30 mg here in the office. ? ?Take meloxicam 7.5 mg--1 daily for pain. ? ?See your primary care office later this month about this problem especially if not improving ?

## 2021-05-01 NOTE — ED Triage Notes (Signed)
Pt presents with R shoulder pain x 2 weeks States he has taken Ibuprofen'.  ?States he has more pain at night.  ?

## 2022-06-01 NOTE — Therapy (Signed)
OUTPATIENT PHYSICAL THERAPY THORACOLUMBAR EVALUATION   Patient Name: Gregory Marshall MRN: [id number] DOB:1950/05/11, 72 y.o., male Today's Date: 06/04/2022  END OF SESSION:  PT End of Session - 06/04/22 1225     Visit Number 1    Number of Visits 12    Date for PT Re-Evaluation 07/16/22    Authorization Type MEDICARE PART A AND B    Progress Note Due on Visit 10    PT Start Time 1145    PT Stop Time 1226    PT Time Calculation (min) 41 min    Activity Tolerance Patient tolerated treatment well    Behavior During Therapy WFL for tasks assessed/performed             Past Medical History:  Diagnosis Date   Arthritis    shoulders   Full dentures    History of kidney stones    Hyperlipidemia    Hypertension    Prostate cancer ([facility])    Renal calculus, left    Vertigo    hx of, x 1 - resolved   Past Surgical History:  Procedure Laterality Date   CARDIAC CATHETERIZATION     CERVICAL FUSION  1998   COLONOSCOPY  2008   LUMBAR LAMINECTOMY/DECOMPRESSION MICRODISCECTOMY Left 06/21/2012   Procedure: LUMBAR LAMINECTOMY/DECOMPRESSION MICRODISCECTOMY 1 LEVEL;  Surgeon: Hewitt Shorts, MD;  Location: [facility];  Service: Neurosurgery;  Laterality: Left;  LEFT L3-4 extraforaminal microdiskectomy   MULTIPLE TOOTH EXTRACTIONS     full dentures   RADIOACTIVE SEED IMPLANT N/A 07/07/2018   Procedure: RADIOACTIVE SEED IMPLANT/BRACHYTHERAPY IMPLANT;  Surgeon: Ihor Gully, MD;  Location: [facility];  Service: Urology;  Laterality: N/A;   SPACE OAR INSTILLATION N/A 07/07/2018   Procedure: SPACE OAR INSTILLATION;  Surgeon: Ihor Gully, MD;  Location: [facility];  Service: Urology;  Laterality: N/A;   Patient Active Problem List   Diagnosis Date Noted   Lumbar stenosis with neurogenic claudication 10/30/2018   Cataract 06/20/2018   Diverticulosis of colon 06/20/2018   Essential hypertension 04/07/2018   Carcinoma of prostate ([facility]) 03/01/2018    Malignant neoplasm of prostate ([facility]) 02/28/2018   Fecal occult blood test positive 03/11/2017   Anal fissure 03/11/2017   Constipation 03/11/2017   Hypertensive left ventricular hypertrophy 03/11/2017   Shoulder pain 03/11/2017   Obesity 05/21/2016   Prediabetes 05/20/2016   Vitamin D deficiency 05/20/2016   Primary erectile dysfunction 01/18/2016   Palpitations 12/24/2012   Left knee pain 08/05/2012   Sciatica 06/11/2012   Abnormal ECG 02/28/2009   Mixed hyperlipidemia 02/24/2009    PCP: Harvest Forest, MD  REFERRING PROVIDER: Bedelia Person, MD  REFERRING DIAG: Radiculopathy, lumbar region [M54.16]   Rationale for Evaluation and Treatment: Rehabilitation  THERAPY DIAG:  Other abnormalities of gait and mobility  Muscle weakness (generalized)  Other low back pain  Radiculopathy, lumbar region  ONSET DATE: 3 months ago   SUBJECTIVE:  SUBJECTIVE STATEMENT: Pt presents to PT with intermittent lower back and L LE radiculopathy. He states that he has had 3 prior  back surgeries, but has since noted numbness and tingling into his L LE. Pt notes most symptoms with walking and standing.   PERTINENT HISTORY:  Arthritis, HTN, prostate CA, Vertigo.   PAIN:  Are you having pain? Yes: NPRS scale: 5-10/10 Pain location: L LE and LB Pain description: Numbness/ tingling into L LE. Dull ache in lower back.  Aggravating factors: Walking, standing.  Relieving factors: Sitting, laying down, gabapentin, heat.   PRECAUTIONS: None  WEIGHT BEARING RESTRICTIONS: No  FALLS:  Has patient fallen in last 6 months? 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   PLOF: Independent  PATIENT GOALS: Pt would like to be able to reduce pain.   OBJECTIVE:   DIAGNOSTIC FINDINGS:  None  PATIENT SURVEYS:  FOTO 49.88%, 59% predicted.   SCREENING FOR RED FLAGS: Bowel or bladder incontinence: No  COGNITION: Overall cognitive status: Within functional limits for tasks assessed     SENSATION: WFL  MUSCLE LENGTH: Hamstrings: Right 75% deg; Left 75% deg   POSTURE: rounded shoulders and forward head  PALPATION: None per pt.   LUMBAR ROM:   AROM eval  Flexion WFL  Extension 75%  Right lateral flexion WFL  Left lateral flexion WFL  Right rotation WFL  Left rotation WFL   (Blank rows = not tested)  LOWER EXTREMITY ROM:     Active  Right eval Left eval  Hip flexion [facility]  Hip abduction [facility]  Hip internal rotation [facility] WFL P!   Hip external rotation [facility]  Knee flexion [facility] WFL  Knee extension WFL WFL   (Blank rows = not tested)  LOWER EXTREMITY MMT:    MMT Right eval Left eval  Hip flexion 4- 4-  Knee flexion 4- 4-  Knee extension 4- 4-   (Blank rows = not tested)  Despite cues, pt does not seem to be putting in full effort to withstand pressure. He demonstrates more functional strength with sit to stands.  FUNCTIONAL TESTS:  5 times sit to stand: 11. 65sec  3 minute walk test: 5,2160ft  pt reports onset ot numbness/ tingling at 1 min, and sharp pain at 2 min. He demonstrates a circumducted gait on is L LE with decreased step length.   GAIT: Distance walked: See above Assistive device utilized: None Level of assistance: Complete Independence Comments: He demonstrates a circumducted gait on is L LE with decreased step length.  TODAY'S TREATMENT:                                                                                                                              DATE: Creating, reviewing, and completing below HEP    PATIENT EDUCATION:  Education details: Educated pt on anatomy and physiology  of current symptoms, FOTO, diagnosis, prognosis, HEP,  and POC. Person educated: Patient Education method: Medical illustrator Education comprehension: verbalized understanding and returned demonstration  HOME EXERCISE PROGRAM: Access Code: [email or web address] URL: [email or web address] Date: 06/04/2022 Prepared by: Royal Hawthorn  Exercises - Supine Piriformis Stretch with Foot on Ground  - 2 x daily - 7 x weekly - 2 sets - 10 reps - 30 hold - Supine Lower Trunk Rotation  - 2 x daily - 7 x weekly - 2 sets - 10 reps   ASSESSMENT:  CLINICAL IMPRESSION: Patient referred to PT for Lower back and L LE radiculopathy. He demonstrates decreased functional ROM with gait and strength secondary to pain. Due to high pain levels with increased walking and weight bearing, discussed aquatics. Pt ambulates with an antalgic gait pattern with quick onset of pain. Patient will benefit from skilled PT to address below impairments, limitations and improve overall function.  OBJECTIVE IMPAIRMENTS: decreased activity tolerance, difficulty walking, decreased balance, decreased endurance, decreased mobility, decreased ROM, decreased strength, impaired flexibility, impaired UE/LE use, postural dysfunction, and pain.  ACTIVITY LIMITATIONS: bending, lifting, carry, locomotion, cleaning, community activity, driving, and or occupation  PERSONAL FACTORS: Arthritis, HTN, prostate CA, Vertigo.  are also affecting patient's functional outcome.  REHAB POTENTIAL: Good  CLINICAL DECISION MAKING: Stable/uncomplicated  EVALUATION COMPLEXITY: Low    GOALS: Short term PT Goals Target date: 06/18/2022 Pt will be I and compliant with HEP. Baseline:  Goal status: New Pt will decrease pain by 25% overall Baseline: Goal status: New  Long term PT goals Target date: 07/16/2022  Pt will improve ROM without onset of pain to [facility] to improve functional mobility Baseline: Goal status: New Pt will improve   hip/knee strength to at least 5-/5 MMT to improve functional strength Baseline: Goal status: New Pt will improve FOTO to at least 59% functional to show improved function Baseline: Goal status: New Pt will reduce pain by overall 50% overall with usual activity Baseline: Goal status: New Pt will reduce pain to overall less than 2-3/10 with usual activity and work activity. Baseline: Goal status: New Pt will be able to ambulate community distances at least 1000 ft WNL gait pattern without complaints Baseline: Goal status: New  PLAN: PT FREQUENCY: 1-3 times per week   PT DURATION: 6-8 weeks  PLANNED INTERVENTIONS (unless contraindicated): aquatic PT, Canalith repositioning, cryotherapy, Electrical stimulation, Iontophoresis with 4 mg/ml dexamethasome, Moist heat, traction, Ultrasound, gait training, Therapeutic exercise, balance training, neuromuscular re-education, patient/family education, prosthetic training, manual techniques, passive ROM, dry needling, taping, vasopnuematic device, vestibular, spinal manipulations, joint manipulations  PLAN FOR NEXT SESSION: review/ update HEP PRN, Aquatics?, start LE strengthening program and assess balance as tolerated.      Champ Mungo, PT 06/04/2022, 12:26 PM

## 2022-06-04 ENCOUNTER — Encounter: Payer: Self-pay | Admitting: Physical Therapy

## 2022-06-04 ENCOUNTER — Other Ambulatory Visit: Payer: Self-pay

## 2022-06-04 ENCOUNTER — Ambulatory Visit: Payer: Managed Care, Other (non HMO) | Attending: Neurosurgery | Admitting: Physical Therapy

## 2022-06-04 DIAGNOSIS — M5416 Radiculopathy, lumbar region: Secondary | ICD-10-CM | POA: Diagnosis present

## 2022-06-04 DIAGNOSIS — M6281 Muscle weakness (generalized): Secondary | ICD-10-CM

## 2022-06-04 DIAGNOSIS — M5459 Other low back pain: Secondary | ICD-10-CM

## 2022-06-04 DIAGNOSIS — R2689 Other abnormalities of gait and mobility: Secondary | ICD-10-CM | POA: Diagnosis present

## 2022-07-18 ENCOUNTER — Ambulatory Visit: Payer: Managed Care, Other (non HMO) | Attending: Neurosurgery

## 2022-07-18 ENCOUNTER — Telehealth: Payer: Self-pay

## 2022-07-18 DIAGNOSIS — M5416 Radiculopathy, lumbar region: Secondary | ICD-10-CM | POA: Insufficient documentation

## 2022-07-18 DIAGNOSIS — M5459 Other low back pain: Secondary | ICD-10-CM | POA: Insufficient documentation

## 2022-07-18 DIAGNOSIS — M6281 Muscle weakness (generalized): Secondary | ICD-10-CM | POA: Insufficient documentation

## 2022-07-18 DIAGNOSIS — R2689 Other abnormalities of gait and mobility: Secondary | ICD-10-CM | POA: Insufficient documentation

## 2022-07-18 NOTE — Telephone Encounter (Signed)
PT called pt due to missed appt today at 9:30. He reports that he thought he canceled this appt.  He confirmed that he will attend 07/20/22 at 9:30.

## 2022-07-20 ENCOUNTER — Ambulatory Visit: Payer: Managed Care, Other (non HMO)

## 2022-07-25 ENCOUNTER — Ambulatory Visit: Payer: Managed Care, Other (non HMO)

## 2022-07-25 DIAGNOSIS — M5416 Radiculopathy, lumbar region: Secondary | ICD-10-CM | POA: Diagnosis present

## 2022-07-25 DIAGNOSIS — R2689 Other abnormalities of gait and mobility: Secondary | ICD-10-CM

## 2022-07-25 DIAGNOSIS — M5459 Other low back pain: Secondary | ICD-10-CM

## 2022-07-25 DIAGNOSIS — M6281 Muscle weakness (generalized): Secondary | ICD-10-CM | POA: Diagnosis present

## 2022-07-25 NOTE — Therapy (Signed)
OUTPATIENT PHYSICAL THERAPY THORACOLUMBAR EVALUATION   Patient Name: Gregory Marshall MRN: [id number] DOB:1950-09-08, 72 y.o., male Today's Date: 07/25/2022  END OF SESSION:  PT End of Session - 07/25/22 1659     Visit Number 2    Date for PT Re-Evaluation 09/07/22    Authorization Type MEDICARE PART A AND B    Progress Note Due on Visit 10    PT Start Time 1621    PT Stop Time 1658    PT Time Calculation (min) 37 min    Activity Tolerance Patient tolerated treatment well    Behavior During Therapy WFL for tasks assessed/performed              Past Medical History:  Diagnosis Date   Arthritis    shoulders   Full dentures    History of kidney stones    Hyperlipidemia    Hypertension    Prostate cancer (HCC)    Renal calculus, left    Vertigo    hx of, x 1 - resolved   Past Surgical History:  Procedure Laterality Date   CARDIAC CATHETERIZATION     CERVICAL FUSION  1998   COLONOSCOPY  2008   LUMBAR LAMINECTOMY/DECOMPRESSION MICRODISCECTOMY Left 06/21/2012   Procedure: LUMBAR LAMINECTOMY/DECOMPRESSION MICRODISCECTOMY 1 LEVEL;  Surgeon: Hewitt Shorts, MD;  Location: [facility];  Service: Neurosurgery;  Laterality: Left;  LEFT L3-4 extraforaminal microdiskectomy   MULTIPLE TOOTH EXTRACTIONS     full dentures   RADIOACTIVE SEED IMPLANT N/A 07/07/2018   Procedure: RADIOACTIVE SEED IMPLANT/BRACHYTHERAPY IMPLANT;  Surgeon: Ihor Gully, MD;  Location: [facility] ;  Service: Urology;  Laterality: N/A;   SPACE OAR INSTILLATION N/A 07/07/2018   Procedure: SPACE OAR INSTILLATION;  Surgeon: Ihor Gully, MD;  Location: [facility];  Service: Urology;  Laterality: N/A;   Patient Active Problem List   Diagnosis Date Noted   Lumbar stenosis with neurogenic claudication 10/30/2018   Cataract 06/20/2018   Diverticulosis of colon 06/20/2018   Essential hypertension 04/07/2018   Carcinoma of prostate (HCC) 03/01/2018   Malignant neoplasm of  prostate (HCC) 02/28/2018   Fecal occult blood test positive 03/11/2017   Anal fissure 03/11/2017   Constipation 03/11/2017   Hypertensive left ventricular hypertrophy 03/11/2017   Shoulder pain 03/11/2017   Obesity 05/21/2016   Prediabetes 05/20/2016   Vitamin D deficiency 05/20/2016   Primary erectile dysfunction 01/18/2016   Palpitations 12/24/2012   Left knee pain 08/05/2012   Sciatica 06/11/2012   Abnormal ECG 02/28/2009   Mixed hyperlipidemia 02/24/2009    PCP: Harvest Forest, MD  REFERRING PROVIDER: Bedelia Person, MD  REFERRING DIAG: Radiculopathy, lumbar region [M54.16]   Rationale for Evaluation and Treatment: Rehabilitation  THERAPY DIAG:  Other abnormalities of gait and mobility - Plan: PT plan of care cert/re-cert  Muscle weakness (generalized) - Plan: PT plan of care cert/re-cert  Other low back pain - Plan: PT plan of care cert/re-cert  Radiculopathy, lumbar region - Plan: PT plan of care cert/re-cert  ONSET DATE: 3 months ago   SUBJECTIVE:  SUBJECTIVE STATEMENT: Lapse in treatment since evaluation >6 weeks ago.  Things feel about the same, I've done the exercises   PERTINENT HISTORY:  Arthritis, HTN, prostate CA, Vertigo.   PAIN:  Are you having pain? Yes: NPRS scale: 8/10 Pain location: L LE and LB Pain description: Numbness/ tingling into L LE. Dull ache in lower back.  Aggravating factors: Walking, standing.  Relieving factors: Sitting, laying down, gabapentin, heat.   PRECAUTIONS: None  WEIGHT BEARING RESTRICTIONS: No  FALLS:  Has patient fallen in last 6 months? 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   PLOF: Independent  PATIENT GOALS: Pt would like to be able to reduce pain.   OBJECTIVE:   DIAGNOSTIC FINDINGS:  None  PATIENT SURVEYS:  FOTO 49.88%, 59% predicted.   SCREENING FOR RED FLAGS: Bowel or bladder incontinence: No  COGNITION: Overall cognitive status: Within functional limits for tasks assessed     SENSATION: WFL  MUSCLE LENGTH: Hamstrings: Right 75% deg; Left 75% deg   POSTURE: rounded shoulders and forward head  PALPATION: None per pt.   LUMBAR ROM:   AROM eval  Flexion WFL  Extension 75%  Right lateral flexion WFL  Left lateral flexion WFL  Right rotation WFL  Left rotation WFL   (Blank rows = not tested)  LOWER EXTREMITY ROM:     Active  Right eval Left eval  Hip flexion [facility]  Hip abduction [facility]  Hip internal rotation [facility] WFL P!   Hip external rotation [facility]  Knee flexion [facility] WFL  Knee extension WFL WFL   (Blank rows = not tested)  LOWER EXTREMITY MMT:    MMT Right eval Left eval  Hip flexion 4- 4-  Knee flexion 4- 4-  Knee extension 4- 4-   (Blank rows = not tested)  Despite cues, pt does not seem to be putting in full effort to withstand pressure. He demonstrates more functional strength with sit to stands.  FUNCTIONAL TESTS:  5 times sit to stand: 11. 65sec  3 minute walk test: 5,2127ft  pt reports onset ot numbness/ tingling at 1 min, and sharp pain at 2 min. He demonstrates a circumducted gait on is L LE with decreased step length.   GAIT: Distance walked: See above Assistive device utilized: None Level of assistance: Complete Independence Comments: He demonstrates a circumducted gait on is L LE with decreased step length.  TODAY'S TREATMENT:           DATE: 07/25/22 Low trunk rotation 3x20 seconds Knee to chest 3x20 seconds Piriformis stretch 2x20 seconds  Sidleying clam x10 Rt and Lt Sit to stand 2x10-verbal cues for WB into Lt LE Seated hamstring stretch and figure 4 3x20 seconds  NuStep:  level 2x 7 minutes  DATE: Creating, reviewing, and completing below HEP    PATIENT EDUCATION:  Education details: Educated pt on anatomy and physiology of current symptoms, FOTO, diagnosis, prognosis, HEP,  and POC. Person educated: Patient Education method: Medical illustrator Education comprehension: verbalized understanding and returned demonstration  HOME EXERCISE PROGRAM: Access Code: [email or web address] URL: [email or web address] Date: 07/25/2022 Prepared by: Tresa Endo  Exercises - Supine Piriformis Stretch with Foot on Ground  - 2 x daily - 7 x weekly - 2 sets - 10 reps - 30 hold - Supine Lower Trunk Rotation  - 2 x daily - 7 x weekly - 2 sets - 10 reps - Seated Hamstring Stretch  - 2 x daily - 7 x weekly - 1 sets - 3 reps - 20 hold - Clamshell  - 2 x daily - 7 x weekly - 2 sets - 10 reps - Seated Piriformis Stretch with Trunk Bend  - 2 x daily - 7 x weekly - 1 sets - 3 reps - 20 hold - Sit to Stand Without Arm Support  - 2 x daily - 7 x weekly - 2 sets - 10 reps - Hooklying Single Knee to Chest Stretch  - 2 x daily - 7 x weekly - 1 sets - 3 reps - 20 hold  ASSESSMENT:  CLINICAL IMPRESSION:  Pt has not been back for treatment since evaluation on 06/04/22.  Pt denies any change in symptoms since the start of care and reports that he has been doing the stretches assigned and goes to the gym.  Pt did well with all exercises in the clinic without increased pain.  PT provided supervision of all exercise and provided verbal and demo cues for technique with exercises.   Session focused on advancing HEP for strength and flexibility.  No changes in functional measures taken at evaluation.  Patient will benefit from skilled PT to address the below impairments and improve overall function.   OBJECTIVE IMPAIRMENTS: decreased activity tolerance, difficulty walking,  decreased balance, decreased endurance, decreased mobility, decreased ROM, decreased strength, impaired flexibility, impaired UE/LE use, postural dysfunction, and pain.  ACTIVITY LIMITATIONS: bending, lifting, carry, locomotion, cleaning, community activity, driving, and or occupation  PERSONAL FACTORS: Arthritis, HTN, prostate CA, Vertigo.  are also affecting patient's functional outcome.  REHAB POTENTIAL: Good  CLINICAL DECISION MAKING: Stable/uncomplicated  EVALUATION COMPLEXITY: Low    GOALS: Short term PT Goals Target date: 08/17/22 Pt will be I and compliant with HEP. Baseline:  Goal status: New Pt will decrease pain by 25% overall Baseline:  Goal status: New  Long term PT goals Target date: 09/07/22  Pt will improve ROM without onset of pain to [facility] to improve functional mobility Baseline: Goal status: New Pt will improve  hip/knee strength to at least 5-/5 MMT to improve functional strength Baseline: Goal status: New Pt will improve FOTO to at least 59% functional to show improved function Baseline: Goal status: New Pt will reduce pain by overall 50% overall with usual activity Baseline: Goal status: New Pt will reduce pain to overall less than 2-3/10 with usual activity and work activity. Baseline: Goal status: New Pt will be able to ambulate community distances at least 1000 ft WNL gait pattern without complaints Baseline: Goal status: New  PLAN: PT FREQUENCY: 1-2 times per week   PT DURATION: 6 weeks  PLANNED INTERVENTIONS (unless contraindicated): aquatic PT, Canalith repositioning, cryotherapy, Electrical stimulation, Iontophoresis with 4 mg/ml dexamethasome, Moist heat, traction, Ultrasound, gait training, Therapeutic exercise, balance training, neuromuscular re-education, patient/family education, prosthetic  training, manual techniques, passive ROM, dry needling, taping, vasopnuematic device, vestibular, spinal manipulations, joint manipulations  PLAN FOR  NEXT SESSION: core and hip strength, flexibility, try lumbar traction, manual as needed     Lorrene Reid, PT 07/25/22 5:05 PM

## 2022-08-01 ENCOUNTER — Ambulatory Visit: Payer: Managed Care, Other (non HMO) | Attending: Neurosurgery

## 2022-08-01 DIAGNOSIS — R293 Abnormal posture: Secondary | ICD-10-CM | POA: Insufficient documentation

## 2022-08-01 DIAGNOSIS — R262 Difficulty in walking, not elsewhere classified: Secondary | ICD-10-CM | POA: Diagnosis present

## 2022-08-01 DIAGNOSIS — R2689 Other abnormalities of gait and mobility: Secondary | ICD-10-CM | POA: Diagnosis present

## 2022-08-01 DIAGNOSIS — M6281 Muscle weakness (generalized): Secondary | ICD-10-CM | POA: Insufficient documentation

## 2022-08-01 DIAGNOSIS — M5416 Radiculopathy, lumbar region: Secondary | ICD-10-CM | POA: Diagnosis present

## 2022-08-01 DIAGNOSIS — M5459 Other low back pain: Secondary | ICD-10-CM | POA: Insufficient documentation

## 2022-08-01 DIAGNOSIS — R252 Cramp and spasm: Secondary | ICD-10-CM | POA: Diagnosis present

## 2022-08-01 NOTE — Therapy (Signed)
OUTPATIENT PHYSICAL THERAPY THORACOLUMBAR EVALUATION   Patient Name: Gregory Marshall MRN: [id number] DOB:[birth year], 72 y.o., male Today's Date: 08/01/2022  END OF SESSION:  PT End of Session - 08/01/22 1057     Visit Number 3    Number of Visits 12    Date for PT Re-Evaluation 09/07/22    Authorization Type MEDICARE PART A AND B    Progress Note Due on Visit 10    PT Start Time 1058    PT Stop Time 1145    PT Time Calculation (min) 47 min    Activity Tolerance Patient tolerated treatment well    Behavior During Therapy WFL for tasks assessed/performed              Past Medical History:  Diagnosis Date   Arthritis    shoulders   Full dentures    History of kidney stones    Hyperlipidemia    Hypertension    Prostate cancer (HCC)    Renal calculus, left    Vertigo    hx of, x 1 - resolved   Past Surgical History:  Procedure Laterality Date   CARDIAC CATHETERIZATION     CERVICAL FUSION  1998   COLONOSCOPY  2008   LUMBAR LAMINECTOMY/DECOMPRESSION MICRODISCECTOMY Left 06/21/2012   Procedure: LUMBAR LAMINECTOMY/DECOMPRESSION MICRODISCECTOMY 1 LEVEL;  Surgeon: Hewitt Shorts, MD;  Location: [facility];  Service: Neurosurgery;  Laterality: Left;  LEFT L3-4 extraforaminal microdiskectomy   MULTIPLE TOOTH EXTRACTIONS     full dentures   RADIOACTIVE SEED IMPLANT N/A 07/07/2018   Procedure: RADIOACTIVE SEED IMPLANT/BRACHYTHERAPY IMPLANT;  Surgeon: Ihor Gully, MD;  Location: [facility];  Service: Urology;  Laterality: N/A;   SPACE OAR INSTILLATION N/A 07/07/2018   Procedure: SPACE OAR INSTILLATION;  Surgeon: Ihor Gully, MD;  Location: [facility];  Service: Urology;  Laterality: N/A;   Patient Active Problem List   Diagnosis Date Noted   Lumbar stenosis with neurogenic claudication 10/30/2018   Cataract 06/20/2018   Diverticulosis of colon 06/20/2018   Essential hypertension 04/07/2018   Carcinoma of prostate (HCC) 03/01/2018    Malignant neoplasm of prostate (HCC) 02/28/2018   Fecal occult blood test positive 03/11/2017   Anal fissure 03/11/2017   Constipation 03/11/2017   Hypertensive left ventricular hypertrophy 03/11/2017   Shoulder pain 03/11/2017   Obesity 05/21/2016   Prediabetes 05/20/2016   Vitamin D deficiency 05/20/2016   Primary erectile dysfunction 01/18/2016   Palpitations 12/24/2012   Left knee pain 08/05/2012   Sciatica 06/11/2012   Abnormal ECG 02/28/2009   Mixed hyperlipidemia 02/24/2009    PCP: Harvest Forest, MD  REFERRING PROVIDER: Bedelia Person, MD  REFERRING DIAG: Radiculopathy, lumbar region [M54.16]   Rationale for Evaluation and Treatment: Rehabilitation  THERAPY DIAG:  Other abnormalities of gait and mobility  Muscle weakness (generalized)  Other low back pain  Radiculopathy, lumbar region  Cramp and spasm  Difficulty in walking, not elsewhere classified  Abnormal posture  ONSET DATE: 3 months ago   SUBJECTIVE:  SUBJECTIVE STATEMENT: Patient states he feels "about the same".  Pain reported at 5/10 (down from 8/10 but he says his pain fluctuates day to day)  PERTINENT HISTORY:  Arthritis, HTN, prostate CA, Vertigo.   PAIN:  08/01/22: Are you having pain? Yes: NPRS scale: 5/10 Pain location: L LE and LB Pain description: Numbness/ tingling into L LE. Dull ache in lower back.  Aggravating factors: Walking, standing.  Relieving factors: Sitting, laying down, gabapentin, heat.   PRECAUTIONS: None  WEIGHT BEARING RESTRICTIONS: No  FALLS:  Has patient fallen in last 6 months? 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   PLOF: Independent  PATIENT GOALS: Pt would like to be able to reduce pain.   OBJECTIVE:   DIAGNOSTIC FINDINGS:  None  PATIENT SURVEYS:  FOTO 49.88%, 59% predicted.   SCREENING FOR RED FLAGS: Bowel or bladder incontinence: No  COGNITION: Overall cognitive status: Within functional limits for tasks assessed     SENSATION: WFL  MUSCLE LENGTH: Hamstrings: Right 75% deg; Left 75% deg   POSTURE: rounded shoulders and forward head  PALPATION: None per pt.   LUMBAR ROM:   AROM eval  Flexion WFL  Extension 75%  Right lateral flexion WFL  Left lateral flexion WFL  Right rotation WFL  Left rotation WFL   (Blank rows = not tested)  LOWER EXTREMITY ROM:     Active  Right eval Left eval  Hip flexion [facility]  Hip abduction [facility]  Hip internal rotation [facility] WFL P!   Hip external rotation [facility]  Knee flexion [facility] WFL  Knee extension WFL WFL   (Blank rows = not tested)  LOWER EXTREMITY MMT:    MMT Right eval Left eval  Hip flexion 4- 4-  Knee flexion 4- 4-  Knee extension 4- 4-   (Blank rows = not tested)  Despite cues, pt does not seem to be putting in full effort to withstand pressure. He demonstrates more functional strength with sit to stands.  FUNCTIONAL TESTS:  5 times sit to stand: 11. 65sec  3 minute walk test: 5,2140ft  pt reports onset ot numbness/ tingling at 1 min, and sharp pain at 2 min. He demonstrates a circumducted gait on is L LE with decreased step length.   GAIT: Distance walked: See above Assistive device utilized: None Level of assistance: Complete Independence Comments: He demonstrates a circumducted gait on is L LE with decreased step length.  TODAY'S TREATMENT:           DATE: 08/01/22 Nustep x 5 min level 5 Standing hamstring stretch 3 x 30 sec both Standing quad/hip flexor stretch 3 x 30 sec both Supine PPT x 20 Supine PPT with 90/90 heel tap x 20 Supine PPT with dying bug x 20 Lumbar mechanical traction x 15 min  at 90 lbs, 60 sec hold, 10 sec rest  DATE: 07/25/22 Low trunk rotation 3x20 seconds Knee to chest 3x20 seconds Piriformis stretch 2x20 seconds  Sidleying clam x10 Rt and Lt Sit to stand 2x10-verbal cues for WB into Lt LE Seated hamstring stretch and figure 4 3x20 seconds  NuStep: level 2x 7 minutes  DATE: Creating, reviewing, and completing below HEP    PATIENT EDUCATION:  Education details: Educated pt on anatomy and physiology of current symptoms, FOTO, diagnosis, prognosis, HEP,  and POC. Person educated: Patient Education method: Medical illustrator Education comprehension: verbalized understanding and returned demonstration  HOME EXERCISE PROGRAM: Access Code: [email or web address] URL: [email or web address] Date: 07/25/2022 Prepared by: Tresa Endo  Exercises - Supine Piriformis Stretch with Foot on Ground  - 2 x daily - 7 x weekly - 2 sets - 10 reps - 30 hold - Supine Lower Trunk Rotation  - 2 x daily - 7 x weekly - 2 sets - 10 reps - Seated Hamstring Stretch  - 2 x daily - 7 x weekly - 1 sets - 3 reps - 20 hold - Clamshell  - 2 x daily - 7 x weekly - 2 sets - 10 reps - Seated Piriformis Stretch with Trunk Bend  - 2 x daily - 7 x weekly - 1 sets - 3 reps - 20 hold - Sit to Stand Without Arm Support  - 2 x daily - 7 x weekly - 2 sets - 10 reps - Hooklying Single Knee to Chest Stretch  - 2 x daily - 7 x weekly - 1 sets - 3 reps - 20 hold  ASSESSMENT:  CLINICAL IMPRESSION:  Patient seemed to be impatient at times during session when PT would attempt to educate or explain.  (Kept looking at watch during session).  He had mentioned he had somewhere to be at 12 pm. PT reassured him that his treatment time was 11 to 11:45.  We completed all stretches with some verbal cues for positioning on quad stretches.  He fatigued and could not complete all reps on core  strengthening but was able to do around 16-18 reps total on each exercise. He reports more tightness in right hamstring but seemed to look tighter in left when in position for the stretch. (He was unable to straighten knee on left).  He responded well to traction and today's exercises.   Patient will benefit from continuing skilled PT to address weakness in core, flexibility issues and lack of lumbar lordosis.   OBJECTIVE IMPAIRMENTS: decreased activity tolerance, difficulty walking, decreased balance, decreased endurance, decreased mobility, decreased ROM, decreased strength, impaired flexibility, impaired UE/LE use, postural dysfunction, and pain.  ACTIVITY LIMITATIONS: bending, lifting, carry, locomotion, cleaning, community activity, driving, and or occupation  PERSONAL FACTORS: Arthritis, HTN, prostate CA, Vertigo.  are also affecting patient's functional outcome.  REHAB POTENTIAL: Good  CLINICAL DECISION MAKING: Stable/uncomplicated  EVALUATION COMPLEXITY: Low    GOALS: Short term PT Goals Target date: 08/17/22 Pt will be I and compliant with HEP. Baseline:  Goal status: New Pt will decrease pain by 25% overall Baseline:  Goal status: New  Long term PT goals Target date: 09/07/22  Pt will improve ROM without onset of pain to [facility] to improve functional mobility Baseline: Goal status: New Pt will improve  hip/knee strength to at least 5-/5 MMT to improve functional strength Baseline: Goal status: New Pt will improve FOTO to at least 59% functional to show improved function Baseline: Goal status: New Pt will reduce pain by overall 50% overall with usual activity Baseline: Goal status: New Pt will reduce pain to overall less than 2-3/10 with usual activity and work activity. Baseline: Goal status: New Pt will be able to ambulate community distances at least 1000 ft WNL gait pattern without complaints Baseline: Goal status: New  PLAN: PT FREQUENCY: 1-2 times per  week    PT DURATION: 6 weeks  PLANNED INTERVENTIONS (unless contraindicated): aquatic PT, Canalith repositioning, cryotherapy, Electrical stimulation, Iontophoresis with 4 mg/ml dexamethasome, Moist heat, traction, Ultrasound, gait training, Therapeutic exercise, balance training, neuromuscular re-education, patient/family education, prosthetic training, manual techniques, passive ROM, dry needling, taping, vasopnuematic device, vestibular, spinal manipulations, joint manipulations  PLAN FOR NEXT SESSION: core and hip strength, flexibility, repeat lumbar traction if good response, manual as needed     Arren Laminack B. Markos Theil, PT 08/01/22 5:28 PM [facility] [address] [place], [place] [address] Phone # [phone] Fax [phone]

## 2022-08-08 ENCOUNTER — Ambulatory Visit: Payer: Managed Care, Other (non HMO)

## 2022-08-08 ENCOUNTER — Telehealth: Payer: Self-pay

## 2022-08-08 NOTE — Telephone Encounter (Signed)
Call placed to patient to inform of missed appointment this morning and to explain no show/cxln policy.  Patient did not realize he had one this morning and apologized.  States he will be here for his Friday appt at 9:30 am.

## 2022-08-10 ENCOUNTER — Ambulatory Visit: Payer: Managed Care, Other (non HMO)

## 2022-08-10 DIAGNOSIS — R2689 Other abnormalities of gait and mobility: Secondary | ICD-10-CM | POA: Diagnosis not present

## 2022-08-10 DIAGNOSIS — R293 Abnormal posture: Secondary | ICD-10-CM

## 2022-08-10 DIAGNOSIS — M5459 Other low back pain: Secondary | ICD-10-CM

## 2022-08-10 DIAGNOSIS — R252 Cramp and spasm: Secondary | ICD-10-CM

## 2022-08-10 DIAGNOSIS — R262 Difficulty in walking, not elsewhere classified: Secondary | ICD-10-CM

## 2022-08-10 DIAGNOSIS — M6281 Muscle weakness (generalized): Secondary | ICD-10-CM

## 2022-08-10 DIAGNOSIS — M5416 Radiculopathy, lumbar region: Secondary | ICD-10-CM

## 2022-08-10 NOTE — Therapy (Signed)
OUTPATIENT PHYSICAL THERAPY THORACOLUMBAR EVALUATION   Patient Name: Gregory Marshall MRN: [id number] DOB:[birth year], 72 y.o., male Today's Date: 08/10/2022  END OF SESSION:  PT End of Session - 08/10/22 0926     Visit Number 4    Number of Visits 12    Date for PT Re-Evaluation 09/07/22    Authorization Type MEDICARE PART A AND B    Progress Note Due on Visit 10    PT Start Time 0927    PT Stop Time 1020    PT Time Calculation (min) 53 min    Activity Tolerance Patient tolerated treatment well    Behavior During Therapy WFL for tasks assessed/performed              Past Medical History:  Diagnosis Date   Arthritis    shoulders   Full dentures    History of kidney stones    Hyperlipidemia    Hypertension    Prostate cancer (HCC)    Renal calculus, left    Vertigo    hx of, x 1 - resolved   Past Surgical History:  Procedure Laterality Date   CARDIAC CATHETERIZATION     CERVICAL FUSION  1998   COLONOSCOPY  2008   LUMBAR LAMINECTOMY/DECOMPRESSION MICRODISCECTOMY Left 06/21/2012   Procedure: LUMBAR LAMINECTOMY/DECOMPRESSION MICRODISCECTOMY 1 LEVEL;  Surgeon: Hewitt Shorts, MD;  Location: [facility];  Service: Neurosurgery;  Laterality: Left;  LEFT L3-4 extraforaminal microdiskectomy   MULTIPLE TOOTH EXTRACTIONS     full dentures   RADIOACTIVE SEED IMPLANT N/A 07/07/2018   Procedure: RADIOACTIVE SEED IMPLANT/BRACHYTHERAPY IMPLANT;  Surgeon: Ihor Gully, MD;  Location: [facility];  Service: Urology;  Laterality: N/A;   SPACE OAR INSTILLATION N/A 07/07/2018   Procedure: SPACE OAR INSTILLATION;  Surgeon: Ihor Gully, MD;  Location: [facility];  Service: Urology;  Laterality: N/A;   Patient Active Problem List   Diagnosis Date Noted   Lumbar stenosis with neurogenic claudication 10/30/2018   Cataract 06/20/2018   Diverticulosis of colon 06/20/2018   Essential hypertension 04/07/2018   Carcinoma of prostate (HCC) 03/01/2018    Malignant neoplasm of prostate (HCC) 02/28/2018   Fecal occult blood test positive 03/11/2017   Anal fissure 03/11/2017   Constipation 03/11/2017   Hypertensive left ventricular hypertrophy 03/11/2017   Shoulder pain 03/11/2017   Obesity 05/21/2016   Prediabetes 05/20/2016   Vitamin D deficiency 05/20/2016   Primary erectile dysfunction 01/18/2016   Palpitations 12/24/2012   Left knee pain 08/05/2012   Sciatica 06/11/2012   Abnormal ECG 02/28/2009   Mixed hyperlipidemia 02/24/2009    PCP: Harvest Forest, MD  REFERRING PROVIDER: Bedelia Person, MD  REFERRING DIAG: Radiculopathy, lumbar region [M54.16]   Rationale for Evaluation and Treatment: Rehabilitation  THERAPY DIAG:  Other abnormalities of gait and mobility  Muscle weakness (generalized)  Other low back pain  Radiculopathy, lumbar region  Difficulty in walking, not elsewhere classified  Cramp and spasm  Abnormal posture  ONSET DATE: 3 months ago   SUBJECTIVE:  SUBJECTIVE STATEMENT: Patient states he did fairly well after last session but "today's not a good day".  Patient reports "burning" in the left buttock and symptoms into the thigh.  He initially rates his pain at 10/10.  We go over the pain scale.  "0 is no pain at all and 10 is rush me to the emergency room"  Patient states "well I can't put any weight on this leg hardly at all".  Upon greeting patient in lobby and bringing him back, he has no appearance of limping or antalgic gait.  He agrees to rate his pain at 9/10 to avoid suggestion to go to to ER.    PERTINENT HISTORY:  Arthritis, HTN, prostate CA, Vertigo.   PAIN:  08/01/22: Are you having pain? Yes: NPRS scale: 9/10 Pain location: L LE and LB Pain description: Burning in left buttock and into L LE. Dull  ache in lower back.  Aggravating factors: Walking, standing.  Relieving factors: Sitting, laying down, gabapentin, heat.   PRECAUTIONS: None  WEIGHT BEARING RESTRICTIONS: No  FALLS:  Has patient fallen in last 6 months? 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   PLOF: Independent  PATIENT GOALS: Pt would like to be able to reduce pain.   OBJECTIVE:   DIAGNOSTIC FINDINGS:  None  PATIENT SURVEYS:  FOTO 49.88%, 59% predicted.   SCREENING FOR RED FLAGS: Bowel or bladder incontinence: No  COGNITION: Overall cognitive status: Within functional limits for tasks assessed     SENSATION: WFL  MUSCLE LENGTH: Hamstrings: Right 75% deg; Left 75% deg   POSTURE: rounded shoulders and forward head  PALPATION: None per pt.   LUMBAR ROM:   AROM eval  Flexion WFL  Extension 75%  Right lateral flexion WFL  Left lateral flexion WFL  Right rotation WFL  Left rotation WFL   (Blank rows = not tested)  LOWER EXTREMITY ROM:     Active  Right eval Left eval  Hip flexion [facility]  Hip abduction [facility]  Hip internal rotation [facility] WFL P!   Hip external rotation [facility]  Knee flexion [facility] WFL  Knee extension WFL WFL   (Blank rows = not tested)  LOWER EXTREMITY MMT:    MMT Right eval Left eval  Hip flexion 4- 4-  Knee flexion 4- 4-  Knee extension 4- 4-   (Blank rows = not tested)  Despite cues, pt does not seem to be putting in full effort to withstand pressure. He demonstrates more functional strength with sit to stands.  FUNCTIONAL TESTS:  5 times sit to stand: 11. 65sec  3 minute walk test: 5,2166ft  pt reports onset ot numbness/ tingling at 1 min, and sharp pain at 2 min. He demonstrates a circumducted gait on is L LE with decreased step length.   GAIT: Distance walked: See above Assistive device utilized:  None Level of assistance: Complete Independence Comments: He demonstrates a circumducted gait on is L LE with decreased step length.  TODAY'S TREATMENT:           DATE: 08/10/22 Nustep x 5 min level 5 Standing hamstring stretch 3 x 30 sec both Standing quad/hip flexor stretch 3 x 30 sec both Supine PPT x 20 Supine PPT with 90/90 heel tap x 20 Supine PPT with dying bug x 20 Supine PPT with SLR to shoulder extension with 5 lb  dumbbell 2 x 10 each LE Lumbar mechanical traction x 15 min at 95 lbs, 60 sec hold, 10 sec rest  DATE: 08/01/22 Nustep x 5 min level 5 Standing hamstring stretch 3 x 30 sec both Standing quad/hip flexor stretch 3 x 30 sec both Supine PPT x 20 Supine PPT with 90/90 heel tap x 20 Supine PPT with dying bug x 20 Lumbar mechanical traction x 15 min at 90 lbs, 60 sec hold, 10 sec rest  DATE: 07/25/22 Low trunk rotation 3x20 seconds Knee to chest 3x20 seconds Piriformis stretch 2x20 seconds  Sidleying clam x10 Rt and Lt Sit to stand 2x10-verbal cues for WB into Lt LE Seated hamstring stretch and figure 4 3x20 seconds  NuStep: level 2x 7 minutes                                                                                                                     DATE: Creating, reviewing, and completing below HEP    PATIENT EDUCATION:  Education details: Educated pt on anatomy and physiology of current symptoms, FOTO, diagnosis, prognosis, HEP,  and POC. Person educated: Patient Education method: Medical illustrator Education comprehension: verbalized understanding and returned demonstration  HOME EXERCISE PROGRAM: Access Code: [email or web address] URL: [email or web address] Date: 07/25/2022 Prepared by: Tresa Endo  Exercises - Supine Piriformis Stretch with Foot on Ground  - 2 x daily - 7 x weekly - 2 sets - 10 reps - 30 hold - Supine Lower Trunk Rotation  - 2 x daily - 7 x weekly - 2 sets - 10 reps - Seated Hamstring Stretch  - 2 x daily - 7 x weekly -  1 sets - 3 reps - 20 hold - Clamshell  - 2 x daily - 7 x weekly - 2 sets - 10 reps - Seated Piriformis Stretch with Trunk Bend  - 2 x daily - 7 x weekly - 1 sets - 3 reps - 20 hold - Sit to Stand Without Arm Support  - 2 x daily - 7 x weekly - 2 sets - 10 reps - Hooklying Single Knee to Chest Stretch  - 2 x daily - 7 x weekly - 1 sets - 3 reps - 20 hold  ASSESSMENT:  CLINICAL IMPRESSION:  Patient seemed to have gotten relief with last session's treatment but had setback.  His pain report is inconsistent with his objective presentation.  He was able to finish all reps on all core exercises today.  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  We increased traction weight to 95 lbs.  He tolerated this without any issues.   Patient would benefit from continuing skilled PT to address weakness in core, flexibility issues and lack of lumbar lordosis.   OBJECTIVE IMPAIRMENTS: decreased activity tolerance, difficulty walking, decreased balance, decreased endurance, decreased mobility, decreased ROM, decreased strength, impaired flexibility, impaired UE/LE use, postural dysfunction, and pain.  ACTIVITY LIMITATIONS: bending, lifting,  carry, locomotion, cleaning, community activity, driving, and or occupation  PERSONAL FACTORS: Arthritis, HTN, prostate CA, Vertigo.  are also affecting patient's functional outcome.  REHAB POTENTIAL: Good  CLINICAL DECISION MAKING: Stable/uncomplicated  EVALUATION COMPLEXITY: Low    GOALS: Short term PT Goals Target date: 08/17/22 Pt will be I and compliant with HEP. Baseline:  Goal status: In progress Pt will decrease pain by 25% overall Baseline:  Goal status: In progress  Long term PT goals Target date: 09/07/22  Pt will improve ROM without onset of pain to [facility] to improve functional mobility Baseline: Goal status: New Pt will improve  hip/knee strength to at least 5-/5 MMT to  improve functional strength Baseline: Goal status: New Pt will improve FOTO to at least 59% functional to show improved function Baseline: Goal status: New Pt will reduce pain by overall 50% overall with usual activity Baseline: Goal status: New Pt will reduce pain to overall less than 2-3/10 with usual activity and work activity. Baseline: Goal status: New Pt will be able to ambulate community distances at least 1000 ft WNL gait pattern without complaints Baseline: Goal status: New  PLAN: PT FREQUENCY: 1-2 times per week   PT DURATION: 6 weeks  PLANNED INTERVENTIONS (unless contraindicated): aquatic PT, Canalith repositioning, cryotherapy, Electrical stimulation, Iontophoresis with 4 mg/ml dexamethasome, Moist heat, traction, Ultrasound, gait training, Therapeutic exercise, balance training, neuromuscular re-education, patient/family education, prosthetic training, manual techniques, passive ROM, dry needling, taping, vasopnuematic device, vestibular, spinal manipulations, joint manipulations  PLAN FOR NEXT SESSION: Progress core and hip strength, flexibility, repeat lumbar traction if good response, manual as needed     Wandalee Klang B. Nakeyia Menden, PT 08/10/22 10:24 AM [facility] [address], [address] [place], [place] [address] Phone # [phone] Fax [phone]

## 2022-08-15 ENCOUNTER — Ambulatory Visit: Payer: Managed Care, Other (non HMO)

## 2022-08-15 DIAGNOSIS — M5459 Other low back pain: Secondary | ICD-10-CM

## 2022-08-15 DIAGNOSIS — M5416 Radiculopathy, lumbar region: Secondary | ICD-10-CM

## 2022-08-15 DIAGNOSIS — M6281 Muscle weakness (generalized): Secondary | ICD-10-CM

## 2022-08-15 DIAGNOSIS — R2689 Other abnormalities of gait and mobility: Secondary | ICD-10-CM | POA: Diagnosis not present

## 2022-08-15 DIAGNOSIS — R262 Difficulty in walking, not elsewhere classified: Secondary | ICD-10-CM

## 2022-08-15 DIAGNOSIS — R252 Cramp and spasm: Secondary | ICD-10-CM

## 2022-08-15 DIAGNOSIS — R293 Abnormal posture: Secondary | ICD-10-CM

## 2022-08-15 NOTE — Therapy (Signed)
OUTPATIENT PHYSICAL THERAPY THORACOLUMBAR EVALUATION   Patient Name: Gregory Marshall MRN: [id number] DOB:[birth year], 72 y.o., male Today's Date: 08/15/2022  END OF SESSION:  PT End of Session - 08/15/22 0933     Visit Number 5    Number of Visits 12    Date for PT Re-Evaluation 09/07/22    Authorization Type MEDICARE PART A AND B    Progress Note Due on Visit 10    PT Start Time 0930    PT Stop Time 1015    PT Time Calculation (min) 45 min    Activity Tolerance Patient tolerated treatment well    Behavior During Therapy WFL for tasks assessed/performed              Past Medical History:  Diagnosis Date   Arthritis    shoulders   Full dentures    History of kidney stones    Hyperlipidemia    Hypertension    Prostate cancer (HCC)    Renal calculus, left    Vertigo    hx of, x 1 - resolved   Past Surgical History:  Procedure Laterality Date   CARDIAC CATHETERIZATION     CERVICAL FUSION  1998   COLONOSCOPY  2008   LUMBAR LAMINECTOMY/DECOMPRESSION MICRODISCECTOMY Left 06/21/2012   Procedure: LUMBAR LAMINECTOMY/DECOMPRESSION MICRODISCECTOMY 1 LEVEL;  Surgeon: Hewitt Shorts, MD;  Location: [facility];  Service: Neurosurgery;  Laterality: Left;  LEFT L3-4 extraforaminal microdiskectomy   MULTIPLE TOOTH EXTRACTIONS     full dentures   RADIOACTIVE SEED IMPLANT N/A 07/07/2018   Procedure: RADIOACTIVE SEED IMPLANT/BRACHYTHERAPY IMPLANT;  Surgeon: Ihor Gully, MD;  Location: [facility];  Service: Urology;  Laterality: N/A;   SPACE OAR INSTILLATION N/A 07/07/2018   Procedure: SPACE OAR INSTILLATION;  Surgeon: Ihor Gully, MD;  Location: [facility];  Service: Urology;  Laterality: N/A;   Patient Active Problem List   Diagnosis Date Noted   Lumbar stenosis with neurogenic claudication 10/30/2018   Cataract 06/20/2018   Diverticulosis of colon 06/20/2018   Essential hypertension 04/07/2018   Carcinoma of prostate (HCC) 03/01/2018    Malignant neoplasm of prostate (HCC) 02/28/2018   Fecal occult blood test positive 03/11/2017   Anal fissure 03/11/2017   Constipation 03/11/2017   Hypertensive left ventricular hypertrophy 03/11/2017   Shoulder pain 03/11/2017   Obesity 05/21/2016   Prediabetes 05/20/2016   Vitamin D deficiency 05/20/2016   Primary erectile dysfunction 01/18/2016   Palpitations 12/24/2012   Left knee pain 08/05/2012   Sciatica 06/11/2012   Abnormal ECG 02/28/2009   Mixed hyperlipidemia 02/24/2009    PCP: Harvest Forest, MD  REFERRING PROVIDER: Bedelia Person, MD  REFERRING DIAG: Radiculopathy, lumbar region [M54.16]   Rationale for Evaluation and Treatment: Rehabilitation  THERAPY DIAG:  Muscle weakness (generalized)  Other low back pain  Radiculopathy, lumbar region  Difficulty in walking, not elsewhere classified  Cramp and spasm  Abnormal posture  ONSET DATE: 3 months ago   SUBJECTIVE:  SUBJECTIVE STATEMENT: Patient states his pain "never really changes".  However, he rates his pain at 6/10 today.  "Legs feel ok today"  PERTINENT HISTORY:  Arthritis, HTN, prostate CA, Vertigo.   PAIN:  08/01/22: Are you having pain? Yes: NPRS scale: 9/10 Pain location: L LE and LB Pain description: Burning in left buttock and into L LE. Dull ache in lower back.  Aggravating factors: Walking, standing.  Relieving factors: Sitting, laying down, gabapentin, heat.   PRECAUTIONS: None  WEIGHT BEARING RESTRICTIONS: No  FALLS:  Has patient fallen in last 6 months? 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   PLOF: Independent  PATIENT GOALS: Pt  would like to be able to reduce pain.   OBJECTIVE:   DIAGNOSTIC FINDINGS:  None  PATIENT SURVEYS:  FOTO 49.88%, 59% predicted.   SCREENING FOR RED FLAGS: Bowel or bladder incontinence: No  COGNITION: Overall cognitive status: Within functional limits for tasks assessed     SENSATION: WFL  MUSCLE LENGTH: Hamstrings: Right 75% deg; Left 75% deg   POSTURE: rounded shoulders and forward head  PALPATION: None per pt.   LUMBAR ROM:   AROM eval  Flexion WFL  Extension 75%  Right lateral flexion WFL  Left lateral flexion WFL  Right rotation WFL  Left rotation WFL   (Blank rows = not tested)  LOWER EXTREMITY ROM:     Active  Right eval Left eval  Hip flexion [facility]  Hip abduction [facility]  Hip internal rotation [facility] WFL P!   Hip external rotation [facility]  Knee flexion [facility] WFL  Knee extension WFL WFL   (Blank rows = not tested)  LOWER EXTREMITY MMT:    MMT Right eval Left eval  Hip flexion 4- 4-  Knee flexion 4- 4-  Knee extension 4- 4-   (Blank rows = not tested)  Despite cues, pt does not seem to be putting in full effort to withstand pressure. He demonstrates more functional strength with sit to stands.  FUNCTIONAL TESTS:  5 times sit to stand: 11. 65sec  3 minute walk test: 5,2180ft  pt reports onset ot numbness/ tingling at 1 min, and sharp pain at 2 min. He demonstrates a circumducted gait on is L LE with decreased step length.   GAIT: Distance walked: See above Assistive device utilized: None Level of assistance: Complete Independence Comments: He demonstrates a circumducted gait on is L LE with decreased step length.  TODAY'S TREATMENT:           DATE: 08/15/22 Nustep x 5 min level 5 Standing hamstring stretch 3 x 30 sec both Standing quad/hip flexor stretch 3 x 30 sec both Supine PPT x 20 Supine PPT with 90/90 heel tap x 20 Supine PPT with dying bug x 20 Supine PPT with ball pass using red physio ball x 10 Lumbar mechanical traction x 15  min at 95 lbs max, 60 lbs min, 60 sec hold, 10 sec rest, 100% 4 steps up and down  DATE: 08/10/22 Nustep x 5 min level 5 Standing hamstring stretch 3 x 30 sec both Standing quad/hip flexor stretch 3 x 30 sec both Supine PPT x 20 Supine PPT with 90/90 heel tap x 20 Supine PPT with dying bug x 20 Supine PPT with SLR to shoulder extension with 5 lb dumbbell 2 x 10 each LE Lumbar mechanical traction x  15 min at 95 lbs, 60 sec hold, 10 sec rest  DATE: 08/01/22 Nustep x 5 min level 5 Standing hamstring stretch 3 x 30 sec both Standing quad/hip flexor stretch 3 x 30 sec both Supine PPT x 20 Supine PPT with 90/90 heel tap x 20 Supine PPT with dying bug x 20 Lumbar mechanical traction x 15 min at 90 lbs, 60 sec hold, 10 sec rest   PATIENT EDUCATION:  Education details: Educated pt on anatomy and physiology of current symptoms, FOTO, diagnosis, prognosis, HEP,  and POC. Person educated: Patient Education method: Medical illustrator Education comprehension: verbalized understanding and returned demonstration  HOME EXERCISE PROGRAM: Access Code: [email or web address] URL: [email or web address] Date: 07/25/2022 Prepared by: Tresa Endo  Exercises - Supine Piriformis Stretch with Foot on Ground  - 2 x daily - 7 x weekly - 2 sets - 10 reps - 30 hold - Supine Lower Trunk Rotation  - 2 x daily - 7 x weekly - 2 sets - 10 reps - Seated Hamstring Stretch  - 2 x daily - 7 x weekly - 1 sets - 3 reps - 20 hold - Clamshell  - 2 x daily - 7 x weekly - 2 sets - 10 reps - Seated Piriformis Stretch with Trunk Bend  - 2 x daily - 7 x weekly - 1 sets - 3 reps - 20 hold - Sit to Stand Without Arm Support  - 2 x daily - 7 x weekly - 2 sets - 10 reps - Hooklying Single Knee to Chest Stretch  - 2 x daily - 7 x weekly - 1 sets - 3 reps - 20 hold  ASSESSMENT:  CLINICAL IMPRESSION:  Mr. Stene is responding well to current treatment plan.  He seems to be more invested in the stretching and reports relief  since last session.  He continues to come in saying his pain level doesn't change but his pain report was 6/10 today vs 9/10 last visit.  We added a more advanced core exercise today and he was able to do this without increased pain.    Patient would benefit from continuing skilled PT to address weakness in core, flexibility issues and lack of lumbar lordosis.   OBJECTIVE IMPAIRMENTS: decreased activity tolerance, difficulty walking, decreased balance, decreased endurance, decreased mobility, decreased ROM, decreased strength, impaired flexibility, impaired UE/LE use, postural dysfunction, and pain.  ACTIVITY LIMITATIONS: bending, lifting, carry, locomotion, cleaning, community activity, driving, and or occupation  PERSONAL FACTORS: Arthritis, HTN, prostate CA, Vertigo.  are also affecting patient's functional outcome.  REHAB POTENTIAL: Good  CLINICAL DECISION MAKING: Stable/uncomplicated  EVALUATION COMPLEXITY: Low    GOALS: Short term PT Goals Target date: 08/17/22 Pt will be I and compliant with HEP. Baseline:  Goal status: In progress Pt will decrease pain by 25% overall Baseline:  Goal status: In progress  Long term PT goals Target date: 09/07/22  Pt will improve ROM without onset of pain to [facility] to improve functional mobility Baseline: Goal status: New Pt will improve  hip/knee strength to at least 5-/5 MMT to improve functional strength Baseline: Goal status: New Pt will improve FOTO to at least 59% functional to show improved function Baseline: Goal status: New Pt will reduce pain by overall 50% overall with usual activity Baseline: Goal status: New Pt will reduce pain to overall less than 2-3/10 with usual activity and work activity. Baseline: Goal status: New Pt will be able to ambulate community distances at least 1000 ft WNL gait pattern without complaints  Baseline: Goal status: New  PLAN: PT FREQUENCY: 1-2 times per week   PT DURATION: 6 weeks  PLANNED  INTERVENTIONS (unless contraindicated): aquatic PT, Canalith repositioning, cryotherapy, Electrical stimulation, Iontophoresis with 4 mg/ml dexamethasome, Moist heat, traction, Ultrasound, gait training, Therapeutic exercise, balance training, neuromuscular re-education, patient/family education, prosthetic training, manual techniques, passive ROM, dry needling, taping, vasopnuematic device, vestibular, spinal manipulations, joint manipulations  PLAN FOR NEXT SESSION: Progress core and hip strength, flexibility, continue lumbar traction if good response, manual as needed     Mikhayla Phillis B. Anissia Wessells, PT 08/15/22 10:14 AM  [facility] [address] [place], [place] [address] Phone # [phone] Fax [phone]

## 2022-08-17 ENCOUNTER — Ambulatory Visit: Payer: Managed Care, Other (non HMO)

## 2022-08-22 ENCOUNTER — Ambulatory Visit: Payer: Managed Care, Other (non HMO)

## 2022-08-22 DIAGNOSIS — R252 Cramp and spasm: Secondary | ICD-10-CM

## 2022-08-22 DIAGNOSIS — M5416 Radiculopathy, lumbar region: Secondary | ICD-10-CM

## 2022-08-22 DIAGNOSIS — R262 Difficulty in walking, not elsewhere classified: Secondary | ICD-10-CM

## 2022-08-22 DIAGNOSIS — M6281 Muscle weakness (generalized): Secondary | ICD-10-CM

## 2022-08-22 DIAGNOSIS — R293 Abnormal posture: Secondary | ICD-10-CM

## 2022-08-22 DIAGNOSIS — M5459 Other low back pain: Secondary | ICD-10-CM

## 2022-08-22 NOTE — Therapy (Addendum)
 OUTPATIENT PHYSICAL THERAPY THORACOLUMBAR TREATMENT PHYSICAL THERAPY DISCHARGE SUMMARY  Visits from Start of Care: 6  Current functional level related to goals / functional outcomes: See below   Remaining deficits: See below   Education / Equipment: See below   Patient agrees to discharge. Patient goals were not met. Patient is being discharged due to lack of progress.    Patient Name: Gregory Marshall MRN: [id number] DOB:Jul 14, 1950, 72 y.o., male Today's Date: 08/22/2022  END OF SESSION:  PT End of Session - 08/22/22 0934     Visit Number 6    Date for PT Re-Evaluation 09/07/22    Authorization Type MEDICARE PART A AND B    Progress Note Due on Visit 10    PT Start Time [phone]    Activity Tolerance Patient tolerated treatment well    Behavior During Therapy [facility] for tasks assessed/performed              Past Medical History:  Diagnosis Date   Arthritis    shoulders   Full dentures    History of kidney stones    Hyperlipidemia    Hypertension    Prostate cancer (HCC)    Renal calculus, left    Vertigo    hx of, x 1 - resolved   Past Surgical History:  Procedure Laterality Date   CARDIAC CATHETERIZATION     CERVICAL FUSION  1998   COLONOSCOPY  2008   LUMBAR LAMINECTOMY/DECOMPRESSION MICRODISCECTOMY Left 06/21/2012   Procedure: LUMBAR LAMINECTOMY/DECOMPRESSION MICRODISCECTOMY 1 LEVEL;  Surgeon: Lamar LELON Peaches, MD;  Location: [facility];  Service: Neurosurgery;  Laterality: Left;  LEFT L3-4 extraforaminal microdiskectomy   MULTIPLE TOOTH EXTRACTIONS     full dentures   RADIOACTIVE SEED IMPLANT N/A 07/07/2018   Procedure: RADIOACTIVE SEED IMPLANT/BRACHYTHERAPY IMPLANT;  Surgeon: Ottelin, Mark, MD;  Location: [facility];  Service: Urology;  Laterality: N/A;   SPACE OAR INSTILLATION N/A 07/07/2018   Procedure: SPACE OAR INSTILLATION;  Surgeon: Ottelin, Mark, MD;  Location: [facility];  Service: Urology;  Laterality: N/A;    Patient Active Problem List   Diagnosis Date Noted   Lumbar stenosis with neurogenic claudication 10/30/2018   Cataract 06/20/2018   Diverticulosis of colon 06/20/2018   Essential hypertension 04/07/2018   Carcinoma of prostate (HCC) 03/01/2018   Malignant neoplasm of prostate (HCC) 02/28/2018   Fecal occult blood test positive 03/11/2017   Anal fissure 03/11/2017   Constipation 03/11/2017   Hypertensive left ventricular hypertrophy 03/11/2017   Shoulder pain 03/11/2017   Obesity 05/21/2016   Prediabetes 05/20/2016   Vitamin D deficiency 05/20/2016   Primary erectile dysfunction 01/18/2016   Palpitations 12/24/2012   Left knee pain 08/05/2012   Sciatica 06/11/2012   Abnormal ECG 02/28/2009   Mixed hyperlipidemia 02/24/2009    PCP: Roanna Ezekiel NOVAK, MD  REFERRING PROVIDER: Debby Dorn MATSU, MD  REFERRING DIAG: Radiculopathy, lumbar region [M54.16]   Rationale for Evaluation and Treatment: Rehabilitation  THERAPY DIAG:  Muscle weakness (generalized)  Other low back pain  Radiculopathy, lumbar region  Difficulty in walking, not elsewhere classified  Cramp and spasm  Abnormal posture  ONSET DATE: 3 months ago   SUBJECTIVE:  SUBJECTIVE STATEMENT: Patient states his pain its at 100/10 today  (patient's objective presentation is inconsistent with his subjective report) He ambulates with non-antalgic gait with no grimacing or objective appearance of pain.    PERTINENT HISTORY:  Arthritis, HTN, prostate CA, Vertigo.   PAIN:  08/01/22: Are you having pain? Yes: NPRS scale: 9/10 Pain location: L LE and LB Pain description: Burning in left buttock and into L LE. Dull ache in lower back.  Aggravating factors: Walking, standing.  Relieving factors: Sitting, laying down,  gabapentin , heat.   PRECAUTIONS: None  WEIGHT BEARING RESTRICTIONS: No  FALLS:  Has patient fallen in last 6 months? 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   PLOF: Independent  PATIENT GOALS: Pt would like to be able to reduce pain.   OBJECTIVE:   DIAGNOSTIC FINDINGS:  None  PATIENT SURVEYS:  FOTO 49.88%, 59% predicted.   SCREENING FOR RED FLAGS: Bowel or bladder incontinence: No  COGNITION: Overall cognitive status: Within functional limits for tasks assessed     SENSATION: WFL  MUSCLE LENGTH: Hamstrings: Right 75% deg; Left 75% deg   POSTURE: rounded shoulders and forward head  PALPATION: None per pt.   LUMBAR ROM:   AROM eval  Flexion WFL  Extension 75%  Right lateral flexion WFL  Left lateral flexion WFL  Right rotation WFL  Left rotation WFL   (Blank rows = not tested)  LOWER EXTREMITY ROM:     Active  Right eval Left eval  Hip flexion [facility]  Hip abduction [facility]  Hip internal rotation [facility] WFL P!   Hip external rotation [facility]  Knee flexion [facility] WFL  Knee extension WFL WFL   (Blank rows = not tested)  LOWER EXTREMITY MMT:    MMT Right eval Left eval  Hip flexion 4- 4-  Knee flexion 4- 4-  Knee extension 4- 4-   (Blank rows = not tested)  Despite cues, pt does not seem to be putting in full effort to withstand pressure. He demonstrates more functional strength with sit to stands.  FUNCTIONAL TESTS:  5 times sit to stand: 11. 65sec  3 minute walk test: 5,2145ft pt reports onset ot numbness/ tingling at 1 min, and sharp pain at 2 min. He demonstrates a circumducted gait on is L LE with decreased step length.   GAIT: Distance walked: See above Assistive device utilized: None Level of assistance: Complete Independence Comments: He demonstrates a circumducted gait on is L LE  with decreased step length.  TODAY'S TREATMENT:           DATE: 08/22/22 Patient arrived with complaints of 100/10 pain but presented with very little antalgic gait or appearance.  He has reported basically no change every visit.  PT inquired if provider had suggested ESI if therapy was not progressing.  Patient confirmed that they had discussed this.  PT suggested that we hold on PT until patient has ESI as we are not making progress and patient is reporting consistent 10+/10 pain level every visit.    DATE: 08/15/22 Nustep x 5 min level 5 Standing hamstring stretch 3 x 30 sec both Standing quad/hip flexor stretch 3 x 30 sec both Supine PPT x 20 Supine PPT with 90/90 heel tap x 20 Supine PPT with dying bug x 20 Supine PPT with ball pass using red physio ball x 10  Lumbar mechanical traction x 15 min at 95 lbs max, 60 lbs min, 60 sec hold, 10 sec rest, 100% 4 steps up and down  DATE: 08/10/22 Nustep x 5 min level 5 Standing hamstring stretch 3 x 30 sec both Standing quad/hip flexor stretch 3 x 30 sec both Supine PPT x 20 Supine PPT with 90/90 heel tap x 20 Supine PPT with dying bug x 20 Supine PPT with SLR to shoulder extension with 5 lb dumbbell 2 x 10 each LE Lumbar mechanical traction x 15 min at 95 lbs, 60 sec hold, 10 sec rest  DATE: 08/01/22 Nustep x 5 min level 5 Standing hamstring stretch 3 x 30 sec both Standing quad/hip flexor stretch 3 x 30 sec both Supine PPT x 20 Supine PPT with 90/90 heel tap x 20 Supine PPT with dying bug x 20 Lumbar mechanical traction x 15 min at 90 lbs, 60 sec hold, 10 sec rest   PATIENT EDUCATION:  Education details: Educated pt on anatomy and physiology of current symptoms, FOTO, diagnosis, prognosis, HEP,  and POC. Person educated: Patient Education method: Medical illustrator Education comprehension: verbalized understanding and returned demonstration  HOME EXERCISE PROGRAM: Access Code: [email or web address] URL:  [email or web address] Date: 07/25/2022 Prepared by: Burnard  Exercises - Supine Piriformis Stretch with Foot on Ground  - 2 x daily - 7 x weekly - 2 sets - 10 reps - 30 hold - Supine Lower Trunk Rotation  - 2 x daily - 7 x weekly - 2 sets - 10 reps - Seated Hamstring Stretch  - 2 x daily - 7 x weekly - 1 sets - 3 reps - 20 hold - Clamshell  - 2 x daily - 7 x weekly - 2 sets - 10 reps - Seated Piriformis Stretch with Trunk Bend  - 2 x daily - 7 x weekly - 1 sets - 3 reps - 20 hold - Sit to Stand Without Arm Support  - 2 x daily - 7 x weekly - 2 sets - 10 reps - Hooklying Single Knee to Chest Stretch  - 2 x daily - 7 x weekly - 1 sets - 3 reps - 20 hold  ASSESSMENT:  CLINICAL IMPRESSION:  Gregory Marshall is reporting no improvement with formal PT.  We are sending him back to provider to discuss ESI.  If these are effective, we will resume if provider agrees.    OBJECTIVE IMPAIRMENTS: decreased activity tolerance, difficulty walking, decreased balance, decreased endurance, decreased mobility, decreased ROM, decreased strength, impaired flexibility, impaired UE/LE use, postural dysfunction, and pain.  ACTIVITY LIMITATIONS: bending, lifting, carry, locomotion, cleaning, community activity, driving, and or occupation  PERSONAL FACTORS: Arthritis, HTN, prostate CA, Vertigo.  are also affecting patient's functional outcome.  REHAB POTENTIAL: Good  CLINICAL DECISION MAKING: Stable/uncomplicated  EVALUATION COMPLEXITY: Low    GOALS: Short term PT Goals Target date: 08/17/22 Pt will be I and compliant with HEP. Baseline:  Goal status: In progress Pt will decrease pain by 25% overall Baseline:  Goal status: In progress  Long term PT goals Target date: 09/07/22  Pt will improve ROM without onset of pain to [facility] to improve functional mobility Baseline: Goal status: New Pt will improve  hip/knee strength to at least 5-/5 MMT to improve functional strength Baseline: Goal status:  New Pt will improve FOTO to at least 59% functional to show improved function Baseline: Goal status: New Pt will reduce pain by overall 50% overall with usual activity Baseline: Goal status: New Pt will  reduce pain to overall less than 2-3/10 with usual activity and work activity. Baseline: Goal status: New Pt will be able to ambulate community distances at least 1000 ft WNL gait pattern without complaints Baseline: Goal status: New  PLAN: PT FREQUENCY: 1-2 times per week   PT DURATION: 6 weeks  PLANNED INTERVENTIONS (unless contraindicated): aquatic PT, Canalith repositioning, cryotherapy, Electrical stimulation, Iontophoresis with 4 mg/ml dexamethasome, Moist heat, traction, Ultrasound, gait training, Therapeutic exercise, balance training, neuromuscular re-education, patient/family education, prosthetic training, manual techniques, passive ROM, dry needling, taping, vasopnuematic device, vestibular, spinal manipulations, joint manipulations  PLAN FOR NEXT SESSION: Hold on PT until f/u with provider to discuss ESI. No charge for today.     Delon B. Riddik Senna, PT 08/22/22 9:50 AM  [facility] [address], [address] [place], [place] [address] Phone # [phone] Fax [phone]

## 2022-08-24 ENCOUNTER — Ambulatory Visit: Payer: Managed Care, Other (non HMO)

## 2022-11-14 ENCOUNTER — Encounter: Payer: Self-pay | Admitting: *Deleted

## 2022-11-14 ENCOUNTER — Other Ambulatory Visit: Payer: Self-pay

## 2022-11-14 ENCOUNTER — Ambulatory Visit
Admission: EM | Admit: 2022-11-14 | Discharge: 2022-11-14 | Disposition: A | Discharge status: Home / Self Care | Payer: Managed Care, Other (non HMO) | Attending: Physician Assistant | Admitting: Physician Assistant

## 2022-11-14 DIAGNOSIS — J209 Acute bronchitis, unspecified: Secondary | ICD-10-CM | POA: Diagnosis not present

## 2022-11-14 MED ORDER — PREDNISONE 20 MG PO TABS: 40.0000 mg | ORAL_TABLET | Freq: Every day | ORAL | 0 refills | Status: AC

## 2022-11-14 NOTE — ED Triage Notes (Addendum)
Cough x 2 weeks, worse at night. Denies fever, denies pain

## 2022-11-14 NOTE — ED Provider Notes (Signed)
[facility]    CSN: [id number] Arrival date & time: 11/14/22  4098      History   Chief Complaint Chief Complaint  Patient presents with   Cough    HPI Gregory Marshall is a 72 y.o. male.   Patient here today for evaluation of cough that he has had for the last 2 weeks.  He denies any fever or pain.  He has not any fever.  He has tried over-the-counter medication without resolution.  The history is provided by the patient.    Past Medical History:  Diagnosis Date   Arthritis    shoulders   Full dentures    History of kidney stones    Hyperlipidemia    Hypertension    Prostate cancer (HCC)    Renal calculus, left    Vertigo    hx of, x 1 - resolved    Patient Active Problem List   Diagnosis Date Noted   Lumbar stenosis with neurogenic claudication 10/30/2018   Cataract 06/20/2018   Diverticulosis of colon 06/20/2018   Essential hypertension 04/07/2018   Carcinoma of prostate (HCC) 03/01/2018   Malignant neoplasm of prostate (HCC) 02/28/2018   Fecal occult blood test positive 03/11/2017   Anal fissure 03/11/2017   Constipation 03/11/2017   Hypertensive left ventricular hypertrophy 03/11/2017   Shoulder pain 03/11/2017   Obesity 05/21/2016   Prediabetes 05/20/2016   Vitamin D deficiency 05/20/2016   Primary erectile dysfunction 01/18/2016   Palpitations 12/24/2012   Left knee pain 08/05/2012   Sciatica 06/11/2012   Abnormal ECG 02/28/2009   Mixed hyperlipidemia 02/24/2009    Past Surgical History:  Procedure Laterality Date   CARDIAC CATHETERIZATION     CERVICAL FUSION  1998   COLONOSCOPY  2008   LUMBAR LAMINECTOMY/DECOMPRESSION MICRODISCECTOMY Left 06/21/2012   Procedure: LUMBAR LAMINECTOMY/DECOMPRESSION MICRODISCECTOMY 1 LEVEL;  Surgeon: Hewitt Shorts, MD;  Location: [facility];  Service: Neurosurgery;  Laterality: Left;  LEFT L3-4 extraforaminal microdiskectomy   MULTIPLE TOOTH EXTRACTIONS     full dentures   RADIOACTIVE SEED  IMPLANT N/A 07/07/2018   Procedure: RADIOACTIVE SEED IMPLANT/BRACHYTHERAPY IMPLANT;  Surgeon: Ihor Gully, MD;  Location: [facility];  Service: Urology;  Laterality: N/A;   SPACE OAR INSTILLATION N/A 07/07/2018   Procedure: SPACE OAR INSTILLATION;  Surgeon: Ihor Gully, MD;  Location: [facility];  Service: Urology;  Laterality: N/A;       Home Medications    Prior to Admission medications   Medication Sig Start Date End Date Taking? Authorizing Provider  amLODipine (NORVASC) 2.5 MG tablet Take 2.5 mg by mouth daily.    Yes [provider]  gabapentin (NEURONTIN) 100 MG capsule Take 100 mg by mouth 3 (three) times daily.   Yes [provider]  meloxicam (MOBIC) 7.5 MG tablet Take 1 tablet (7.5 mg total) by mouth daily. 05/01/21  Yes Zenia Resides, MD  rosuvastatin (CRESTOR) 10 MG tablet Take 10 mg by mouth at bedtime.   Yes [provider]  ferrous sulfate 325 (65 FE) MG tablet Take 325 mg by mouth daily with breakfast.    [provider]  traMADol (ULTRAM) 50 MG tablet Take by mouth every 6 (six) hours as needed.    [provider]    Family History Family History  Problem Relation Age of Onset   Alzheimer's disease Mother    Heart disease Brother 107       CABG   Prostate cancer Maternal Uncle  Prostate cancer Cousin        paternal   Prostate cancer Cousin        paternal   Colon cancer Neg Hx    Colon polyps Neg Hx    Rectal cancer Neg Hx    Stomach cancer Neg Hx     Social History Social History   Tobacco Use   Smoking status: Never   Smokeless tobacco: Never  Vaping Use   Vaping status: Never Used  Substance Use Topics   Alcohol use: Not Currently    Alcohol/week: 0.0 standard drinks of alcohol   Drug use: Never     Allergies   Patient has no known allergies.   Review of Systems Review of Systems  Constitutional:  Negative for chills and fever.  HENT:  Positive for  congestion. Negative for ear pain and sore throat.   Eyes:  Negative for discharge and redness.  Respiratory:  Positive for cough. Negative for shortness of breath.   Gastrointestinal:  Negative for abdominal pain, nausea and vomiting.     Physical Exam Triage Vital Signs ED Triage Vitals  Encounter Vitals Group     BP      Systolic BP Percentile      Diastolic BP Percentile      Pulse      Resp      Temp      Temp src      SpO2      Weight      Height      Head Circumference      Peak Flow      Pain Score      Pain Loc      Pain Education      Exclude from Growth Chart    No data found.  Updated Vital Signs BP 118/72 (BP Location: Left Arm)   Pulse (!) 57   Temp 98 F (36.7 C) (Oral)   Resp 18   SpO2 97%     Physical Exam Vitals and nursing note reviewed.  Constitutional:      General: He is not in acute distress.    Appearance: Normal appearance. He is not ill-appearing.  HENT:     Head: Normocephalic and atraumatic.     Nose: Nose normal. No congestion.     Mouth/Throat:     Mouth: Mucous membranes are moist.     Pharynx: Oropharynx is clear. No oropharyngeal exudate or posterior oropharyngeal erythema.  Eyes:     Conjunctiva/sclera: Conjunctivae normal.  Cardiovascular:     Rate and Rhythm: Normal rate and regular rhythm.     Heart sounds: Normal heart sounds. No murmur heard. Pulmonary:     Effort: Pulmonary effort is normal. No respiratory distress.     Breath sounds: Normal breath sounds. No wheezing, rhonchi or rales.  Skin:    General: Skin is warm and dry.  Neurological:     Mental Status: He is alert.  Psychiatric:        Mood and Affect: Mood normal.        Thought Content: Thought content normal.      UC Treatments / Results  Labs (all labs ordered are listed, but only abnormal results are displayed) Labs Reviewed - No data to display  EKG   Radiology No results found.  Procedures Procedures (including critical care  time)  Medications Ordered in UC Medications - No data to display  Initial Impression / Assessment and Plan / UC Course  I have reviewed the triage vital signs and the nursing notes.  Pertinent labs & imaging results that were available during my care of the patient were reviewed by me and considered in my medical decision making (see chart for details).   Will prescribe steroid burst for suspected bronchitis.  Recommended follow-up if no gradual improvement with any further concerns.   Final Clinical Impressions(s) / UC Diagnoses   Final diagnoses:  Acute bronchitis, unspecified organism   Discharge Instructions   None    ED Prescriptions     Medication Sig Dispense Auth. Provider   predniSONE (DELTASONE) 20 MG tablet Take 2 tablets (40 mg total) by mouth daily with breakfast for 5 days. 10 tablet Tomi Bamberger, PA-C      PDMP not reviewed this encounter.   Tomi Bamberger, PA-C 11/28/22 2027

## 2022-11-28 ENCOUNTER — Encounter: Payer: Self-pay | Admitting: Physician Assistant

## 2022-12-24 DIAGNOSIS — M25552 Pain in left hip: Secondary | ICD-10-CM | POA: Insufficient documentation

## 2023-01-15 DIAGNOSIS — G542 Cervical root disorders, not elsewhere classified: Secondary | ICD-10-CM | POA: Insufficient documentation

## 2023-01-15 DIAGNOSIS — G629 Polyneuropathy, unspecified: Secondary | ICD-10-CM | POA: Insufficient documentation

## 2023-04-03 ENCOUNTER — Other Ambulatory Visit: Payer: Self-pay | Admitting: Internal Medicine

## 2023-04-03 DIAGNOSIS — M545 Low back pain, unspecified: Secondary | ICD-10-CM

## 2023-05-02 ENCOUNTER — Encounter: Payer: Self-pay | Admitting: Internal Medicine

## 2023-05-04 ENCOUNTER — Ambulatory Visit
Admission: RE | Admit: 2023-05-04 | Discharge: 2023-05-04 | Disposition: A | Discharge status: Home / Self Care | Source: Ambulatory Visit | Attending: Internal Medicine | Admitting: Internal Medicine

## 2023-05-04 DIAGNOSIS — M545 Low back pain, unspecified: Secondary | ICD-10-CM

## 2023-05-04 MED ORDER — GADOPICLENOL 0.5 MMOL/ML IV SOLN
9.0000 mL | Freq: Once | INTRAVENOUS | Status: AC | PRN
  Administered 2023-05-04: 9 mL via INTRAVENOUS

## 2023-08-01 ENCOUNTER — Ambulatory Visit
Admission: EM | Admit: 2023-08-01 | Discharge: 2023-08-01 | Disposition: A | Discharge status: Home / Self Care | Attending: Emergency Medicine | Admitting: Emergency Medicine

## 2023-08-01 DIAGNOSIS — R109 Unspecified abdominal pain: Secondary | ICD-10-CM

## 2023-08-01 DIAGNOSIS — R1011 Right upper quadrant pain: Secondary | ICD-10-CM | POA: Diagnosis not present

## 2023-08-01 DIAGNOSIS — R112 Nausea with vomiting, unspecified: Secondary | ICD-10-CM

## 2023-08-01 LAB — POCT URINALYSIS DIP (MANUAL ENTRY)
Glucose, UA: NEGATIVE mg/dL
Ketones, POC UA: NEGATIVE mg/dL
Leukocytes, UA: NEGATIVE
Nitrite, UA: NEGATIVE
Protein Ur, POC: 100 mg/dL — AB
Spec Grav, UA: >=1.03 — AB (ref 1.010–1.025)
Urobilinogen, UA: 1 U/dL
pH, UA: 6 (ref 5.0–8.0)

## 2023-08-01 MED ORDER — TAMSULOSIN HCL 0.4 MG PO CAPS: 0.4000 mg | ORAL_CAPSULE | Freq: Every day | ORAL | 0 refills | Status: AC

## 2023-08-01 MED ORDER — HYDROCODONE-ACETAMINOPHEN 5-325 MG PO TABS: 2.0000 | ORAL_TABLET | ORAL | 0 refills | Status: AC | PRN

## 2023-08-01 MED ORDER — ONDANSETRON 4 MG PO TBDP: 4.0000 mg | ORAL_TABLET | Freq: Three times a day (TID) | ORAL | 0 refills | Status: AC | PRN

## 2023-08-01 MED ORDER — KETOROLAC TROMETHAMINE 30 MG/ML IJ SOLN
30.0000 mg | Freq: Once | INTRAMUSCULAR | Status: AC
  Administered 2023-08-01: 30 mg via INTRAMUSCULAR

## 2023-08-01 NOTE — Discharge Instructions (Addendum)
 Your symptoms are consistent with a kidney stone.  You can take the Norco every 4-6 hours as needed.  Use it sparingly for breakthrough pain, you can use Tylenol  500 mg for any moderate pain.  Use the Zofran  as needed for nausea or vomiting, this will melt under your tongue.  Take the Flomax  nightly after supper to help dilate your ureter to make the stone easier to pass.  Warm baths may help relax things and help pass the stone along.  Ensure you are staying well-hydrated with at least 64 ounces of water.  Seek immediate care at the nearest emergency department if you find that you are unable to urinate, or have any new concerning symptoms.

## 2023-08-01 NOTE — ED Provider Notes (Signed)
 [facility]    CSN: [id number] Arrival date & time: 08/01/23  1037      History   Chief Complaint Chief Complaint  Patient presents with   Flank Pain    HPI Gregory Marshall is a 73 y.o. male.   Patient presents to clinic over concern of right flank pain that woke him up this morning.  He was nauseous earlier this morning and did have an episode of emesis as well.  Did take a tramadol earlier this morning for pain and vomited up 1.5 hours later.  Has not had any issues with urinating, denies hematuria, urgency, frequency.   Does have a history of kidney stones and reports this feels similar.  Has not had one in about 10 years or so.  Also has a history of chronic back pain but this is mostly on his left side.  History of prostate cancer, reports he was told he was all good by his provider.  The history is provided by the patient and medical records.  Flank Pain    Past Medical History:  Diagnosis Date   Arthritis    shoulders   Full dentures    History of kidney stones    Hyperlipidemia    Hypertension    Prostate cancer (HCC)    Renal calculus, left    Vertigo    hx of, x 1 - resolved    Patient Active Problem List   Diagnosis Date Noted   Lumbar stenosis with neurogenic claudication 10/30/2018   Cataract 06/20/2018   Diverticulosis of colon 06/20/2018   Essential hypertension 04/07/2018   Carcinoma of prostate (HCC) 03/01/2018   Malignant neoplasm of prostate (HCC) 02/28/2018   Fecal occult blood test positive 03/11/2017   Anal fissure 03/11/2017   Constipation 03/11/2017   Hypertensive left ventricular hypertrophy 03/11/2017   Shoulder pain 03/11/2017   Obesity 05/21/2016   Prediabetes 05/20/2016   Vitamin D deficiency 05/20/2016   Primary erectile dysfunction 01/18/2016   Palpitations 12/24/2012   Left knee pain 08/05/2012   Sciatica 06/11/2012   Abnormal ECG 02/28/2009   Mixed hyperlipidemia 02/24/2009    Past Surgical History:   Procedure Laterality Date   CARDIAC CATHETERIZATION     CERVICAL FUSION  1998   COLONOSCOPY  2008   LUMBAR LAMINECTOMY/DECOMPRESSION MICRODISCECTOMY Left 06/21/2012   Procedure: LUMBAR LAMINECTOMY/DECOMPRESSION MICRODISCECTOMY 1 LEVEL;  Surgeon: Lamar LELON Peaches, MD;  Location: [facility];  Service: Neurosurgery;  Laterality: Left;  LEFT L3-4 extraforaminal microdiskectomy   MULTIPLE TOOTH EXTRACTIONS     full dentures   RADIOACTIVE SEED IMPLANT N/A 07/07/2018   Procedure: RADIOACTIVE SEED IMPLANT/BRACHYTHERAPY IMPLANT;  Surgeon: Ottelin, Mark, MD;  Location: [facility];  Service: Urology;  Laterality: N/A;   SPACE OAR INSTILLATION N/A 07/07/2018   Procedure: SPACE OAR INSTILLATION;  Surgeon: Ottelin, Mark, MD;  Location: [facility];  Service: Urology;  Laterality: N/A;       Home Medications    Prior to Admission medications   Medication Sig Start Date End Date Taking? Authorizing Provider  HYDROcodone -acetaminophen  (NORCO/VICODIN) 5-325 MG tablet Take 2 tablets by mouth every 4 (four) hours as needed. 08/01/23  Yes Poseidon Pam  N, FNP  ondansetron  (ZOFRAN -ODT) 4 MG disintegrating tablet Take 1 tablet (4 mg total) by mouth every 8 (eight) hours as needed for nausea or vomiting. 08/01/23  Yes Siaosi Alter  N, FNP  tamsulosin  (FLOMAX ) 0.4 MG CAPS capsule Take 1 capsule (0.4 mg total) by mouth daily after supper  for 10 days. 08/01/23 08/11/23 Yes Abhijot Straughter  N, FNP  amLODipine  (NORVASC ) 2.5 MG tablet Take 2.5 mg by mouth daily.     [provider]  ferrous sulfate  325 (65 FE) MG tablet Take 325 mg by mouth daily with breakfast.    [provider]  gabapentin  (NEURONTIN ) 100 MG capsule Take 100 mg by mouth 3 (three) times daily.    [provider]  meloxicam  (MOBIC ) 7.5 MG tablet Take 1 tablet (7.5 mg total) by mouth daily. 05/01/21   Vonna Sharlet POUR, MD  rosuvastatin  (CRESTOR ) 10 MG tablet Take 10 mg by mouth at bedtime.     [provider]  traMADol (ULTRAM) 50 MG tablet Take by mouth every 6 (six) hours as needed.    [provider]    Family History Family History  Problem Relation Age of Onset   Alzheimer's disease Mother    Heart disease Brother 84       CABG   Prostate cancer Maternal Uncle    Prostate cancer Cousin        paternal   Prostate cancer Cousin        paternal   Colon cancer Neg Hx    Colon polyps Neg Hx    Rectal cancer Neg Hx    Stomach cancer Neg Hx     Social History Social History   Tobacco Use   Smoking status: Never   Smokeless tobacco: Never  Vaping Use   Vaping status: Never Used  Substance Use Topics   Alcohol use: Not Currently    Alcohol/week: 0.0 standard drinks of alcohol   Drug use: Never     Allergies   Patient has no known allergies.   Review of Systems Review of Systems  Per HPI  Physical Exam Triage Vital Signs ED Triage Vitals  Encounter Vitals Group     BP 08/01/23 1050 (!) 149/88     Girls Systolic BP Percentile --      Girls Diastolic BP Percentile --      Boys Systolic BP Percentile --      Boys Diastolic BP Percentile --      Pulse Rate 08/01/23 1050 70     Resp 08/01/23 1050 16     Temp 08/01/23 1050 97.9 F (36.6 C)     Temp Source 08/01/23 1050 Oral     SpO2 08/01/23 1050 96 %     Weight --      Height --      Head Circumference --      Peak Flow --      Pain Score 08/01/23 1051 10     Pain Loc --      Pain Education --      Exclude from Growth Chart --    No data found.  Updated Vital Signs BP (!) 149/88 (BP Location: Left Arm)   Pulse 70   Temp 97.9 F (36.6 C) (Oral)   Resp 16   SpO2 96%   Visual Acuity Right Eye Distance:   Left Eye Distance:   Bilateral Distance:    Right Eye Near:   Left Eye Near:    Bilateral Near:     Physical Exam Vitals and nursing note reviewed.  Constitutional:      Appearance: Normal appearance.  HENT:     Head: Normocephalic and atraumatic.     Right  Ear: External ear normal.     Left Ear: External ear normal.  Nose: Nose normal.     Mouth/Throat:     Mouth: Mucous membranes are moist.  Eyes:     Conjunctiva/sclera: Conjunctivae normal.  Cardiovascular:     Rate and Rhythm: Normal rate.  Pulmonary:     Effort: Pulmonary effort is normal. No respiratory distress.  Musculoskeletal:        General: Normal range of motion.  Skin:    General: Skin is warm and dry.     Findings: No rash.  Neurological:     General: No focal deficit present.     Mental Status: He is alert.  Psychiatric:        Mood and Affect: Mood normal.      UC Treatments / Results  Labs (all labs ordered are listed, but only abnormal results are displayed) Labs Reviewed  POCT URINALYSIS DIP (MANUAL ENTRY) - Abnormal; Notable for the following components:      Result Value   Color, UA yellow (*)    Bilirubin, UA small (*)    Spec Grav, UA >=1.030 (*)    Blood, UA small (*)    Protein Ur, POC =100 (*)    All other components within normal limits    EKG   Radiology No results found.  Procedures Procedures (including critical care time)  Medications Ordered in UC Medications  ketorolac  (TORADOL ) 30 MG/ML injection 30 mg (30 mg Intramuscular Given 08/01/23 1123)    Initial Impression / Assessment and Plan / UC Course  I have reviewed the triage vital signs and the nursing notes.  Pertinent labs & imaging results that were available during my care of the patient were reviewed by me and considered in my medical decision making (see chart for details).  Vitals in triage reviewed, patient is hemodynamically stable.  Right-sided flank pain that feels similar to previous nephrolithiasis.  UA with small red blood cells.  Presentation consistent with previous chronic back pain flareups.  Will treat as nephrolithiasis, strain all urine, IM Toradol , Norco and Flomax .  Plan of care, follow-up care return precautions given, no questions at this  time.     Final Clinical Impressions(s) / UC Diagnoses   Final diagnoses:  Flank pain     Discharge Instructions      Your symptoms are consistent with a kidney stone.  You can take the Norco every 4-6 hours as needed.  Use it sparingly for breakthrough pain, you can use Tylenol  500 mg for any moderate pain.  Use the Zofran  as needed for nausea or vomiting, this will melt under your tongue.  Take the Flomax  nightly after supper to help dilate your ureter to make the stone easier to pass.  Warm baths may help relax things and help pass the stone along.  Ensure you are staying well-hydrated with at least 64 ounces of water.  Seek immediate care at the nearest emergency department if you find that you are unable to urinate, or have any new concerning symptoms.     ED Prescriptions     Medication Sig Dispense Auth. Provider   HYDROcodone -acetaminophen  (NORCO/VICODIN) 5-325 MG tablet Take 2 tablets by mouth every 4 (four) hours as needed. 10 tablet Dreama, Jamilet Ambroise  N, FNP   tamsulosin  (FLOMAX ) 0.4 MG CAPS capsule Take 1 capsule (0.4 mg total) by mouth daily after supper for 10 days. 10 capsule Dreama, Eliyah Mcshea  N, FNP   ondansetron  (ZOFRAN -ODT) 4 MG disintegrating tablet Take 1 tablet (4 mg total) by mouth every 8 (eight) hours as needed for nausea  or vomiting. 20 tablet Dreama, Shareta Fishbaugh  N, FNP      I have reviewed the PDMP during this encounter.   Dreama, Payden Bonus  N, FNP 08/01/23 1127

## 2023-08-01 NOTE — ED Triage Notes (Signed)
 Pt states he woke this morning with pain to his right side states he vomited one time this morning. States he took a Tramadol at home for the pain.

## 2023-11-28 ENCOUNTER — Encounter: Payer: Self-pay | Admitting: Emergency Medicine

## 2023-11-28 ENCOUNTER — Ambulatory Visit
Admission: EM | Admit: 2023-11-28 | Discharge: 2023-11-28 | Disposition: A | Discharge status: Home / Self Care | Attending: Internal Medicine | Admitting: Internal Medicine

## 2023-11-28 DIAGNOSIS — M62838 Other muscle spasm: Secondary | ICD-10-CM

## 2023-11-28 MED ORDER — BACLOFEN 5 MG PO TABS: 5.0000 mg | ORAL_TABLET | Freq: Three times a day (TID) | ORAL | 0 refills | Status: AC | PRN
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
## 2023-12-16 ENCOUNTER — Ambulatory Visit: Attending: Internal Medicine | Admitting: Physical Therapy

## 2023-12-16 ENCOUNTER — Other Ambulatory Visit: Payer: Self-pay

## 2023-12-16 ENCOUNTER — Encounter: Payer: Self-pay | Admitting: Physical Therapy

## 2023-12-16 DIAGNOSIS — R2689 Other abnormalities of gait and mobility: Secondary | ICD-10-CM | POA: Diagnosis present

## 2023-12-16 DIAGNOSIS — M25652 Stiffness of left hip, not elsewhere classified: Secondary | ICD-10-CM | POA: Diagnosis present

## 2023-12-16 DIAGNOSIS — M5416 Radiculopathy, lumbar region: Secondary | ICD-10-CM | POA: Insufficient documentation

## 2023-12-16 DIAGNOSIS — M6281 Muscle weakness (generalized): Secondary | ICD-10-CM | POA: Insufficient documentation

## 2023-12-30 ENCOUNTER — Ambulatory Visit: Attending: Internal Medicine

## 2023-12-30 DIAGNOSIS — M5416 Radiculopathy, lumbar region: Secondary | ICD-10-CM | POA: Diagnosis present

## 2023-12-30 DIAGNOSIS — M25652 Stiffness of left hip, not elsewhere classified: Secondary | ICD-10-CM | POA: Diagnosis present

## 2023-12-30 DIAGNOSIS — M5459 Other low back pain: Secondary | ICD-10-CM | POA: Insufficient documentation

## 2023-12-30 DIAGNOSIS — R2689 Other abnormalities of gait and mobility: Secondary | ICD-10-CM | POA: Diagnosis present

## 2023-12-30 DIAGNOSIS — M6281 Muscle weakness (generalized): Secondary | ICD-10-CM | POA: Diagnosis present

## 2023-12-30 DIAGNOSIS — R262 Difficulty in walking, not elsewhere classified: Secondary | ICD-10-CM | POA: Insufficient documentation

## 2023-12-30 NOTE — Therapy (Signed)
 OUTPATIENT PHYSICAL THERAPY TREATMENT   Patient Name: Gregory Marshall MRN: [id number] DOB:1950/10/27, 73 y.o., male Today's Date: 12/30/2023  END OF SESSION:  PT End of Session - 12/30/23 1225     Visit Number 2    Date for Recertification  02/10/24    Authorization Type cigna managed MCR    PT Start Time 1147    PT Stop Time 1226    PT Time Calculation (min) 39 min    Activity Tolerance Patient tolerated treatment well    Behavior During Therapy WFL for tasks assessed/performed           Past Medical History:  Diagnosis Date   Arthritis    shoulders   Full dentures    History of kidney stones    Hyperlipidemia    Hypertension    Prostate cancer (HCC)    Renal calculus, left    Vertigo    hx of, x 1 - resolved   Past Surgical History:  Procedure Laterality Date   CARDIAC CATHETERIZATION     CERVICAL FUSION  1998   COLONOSCOPY  2008   LUMBAR LAMINECTOMY/DECOMPRESSION MICRODISCECTOMY Left 06/21/2012   Procedure: LUMBAR LAMINECTOMY/DECOMPRESSION MICRODISCECTOMY 1 LEVEL;  Surgeon: Lamar LELON Peaches, MD;  Location: [facility];  Service: Neurosurgery;  Laterality: Left;  LEFT L3-4 extraforaminal microdiskectomy   MULTIPLE TOOTH EXTRACTIONS     full dentures   RADIOACTIVE SEED IMPLANT N/A 07/07/2018   Procedure: RADIOACTIVE SEED IMPLANT/BRACHYTHERAPY IMPLANT;  Surgeon: Ottelin, Mark, MD;  Location: [facility] [place];  Service: Urology;  Laterality: N/A;   SPACE OAR INSTILLATION N/A 07/07/2018   Procedure: SPACE OAR INSTILLATION;  Surgeon: Ottelin, Mark, MD;  Location: [facility];  Service: Urology;  Laterality: N/A;   Patient Active Problem List   Diagnosis Date Noted   Lumbar stenosis with neurogenic claudication 10/30/2018   Cataract 06/20/2018   Diverticulosis of colon 06/20/2018   Essential hypertension 04/07/2018   Carcinoma of prostate (HCC) 03/01/2018   Malignant neoplasm of prostate (HCC) 02/28/2018   Fecal occult blood test positive  03/11/2017   Anal fissure 03/11/2017   Constipation 03/11/2017   Hypertensive left ventricular hypertrophy 03/11/2017   Shoulder pain 03/11/2017   Obesity 05/21/2016   Prediabetes 05/20/2016   Vitamin D deficiency 05/20/2016   Primary erectile dysfunction 01/18/2016   Palpitations 12/24/2012   Left knee pain 08/05/2012   Sciatica 06/11/2012   Abnormal ECG 02/28/2009   Mixed hyperlipidemia 02/24/2009    PCP: Roanna Ezekiel NOVAK, MD   REFERRING PROVIDER: Roanna Ezekiel NOVAK, MD   REFERRING DIAG: M54.16 (ICD-10-CM) - Radiculopathy, lumbar region   Rationale for Evaluation and Treatment: Rehabilitation  THERAPY DIAG:  Radiculopathy, lumbar region  Stiffness of left hip, not elsewhere classified  Muscle weakness (generalized)  Other abnormalities of gait and mobility  ONSET DATE: 3-4 years  SUBJECTIVE:  SUBJECTIVE STATEMENT: My pain is 10/10 today. I've been doing the stretches.   From eval: Patient with 3-4 year history of left lumbar radiculopathy. His fusion helped the right sided pain. Walking is a burning sensation and 10/10 pain. Sitting is good.   PERTINENT HISTORY:  L3-5 fusion, HTN, OA  PAIN: 12/30/23 Are you having pain? Yes: NPRS scale: 8-10/10 Pain location: left buttock to ant/post thigh to calf Pain description: burning Aggravating factors: standind and walking Relieving factors: sitting  PRECAUTIONS: None  RED FLAGS: None   WEIGHT BEARING RESTRICTIONS: No  FALLS:  Has patient fallen in last 6 months? No  LIVING ENVIRONMENT: Lives with: lives with their spouse Lives in: House/apartment Stairs: No Has following equipment at home: Single point cane  OCCUPATION: semi-retired transportation driver  PLOF: Independent  PATIENT GOALS: stop pain   NEXT MD VISIT:  after PT  OBJECTIVE:  Note: Objective measures were completed at Evaluation unless otherwise noted.  DIAGNOSTIC FINDINGS:  MRI 05/17/23 IMPRESSION: 1. No change in the appearance of the lumbar spine since the prior MRI. 2. Status post L3-5 fusion. No stenosis at the operated levels. 3. Moderately severe central canal stenosis and moderate left foraminal narrowing at L2-3 is unchanged. 4. Mild bilateral foraminal narrowing at L5-S1 is unchanged. 5. Moderate appearing bilateral foraminal narrowing at T10-11.  PATIENT SURVEYS:  Modified Oswestry: 18 / 50 = 36.0 % MODIFIED OSWESTRY DISABILITY SCALE  Date: 12/16/23 Score  Pain intensity 3 =  Pain medication provides me with moderate relief from pain.  2. Personal care (washing, dressing, etc.) 1 =  I can take care of myself normally, but it increases my pain.  3. Lifting 3 = Pain prevents me from lifting heavy weights, but I can manage light to medium weights if they are conveniently positioned  4. Walking 3 =  Pain prevents me from walking more than  mile.  5. Sitting 0 =  I can sit in any chair as long as I like.  6. Standing 4 =  Pain prevents me from standing more than 10 minutes.  7. Sleeping 0 = Pain does not prevent me from sleeping well.  8. Social Life 2 = Pain prevents me from participating in more energetic activities (eg. sports, dancing).  9. Traveling 0 =  I can travel anywhere without increased pain.  10. Employment/ Homemaking 2 = I can perform most of my homemaking/job duties, but pain prevents me from performing more physically stressful activities (eg, lifting, vacuuming).  Total 18/50   Interpretation of scores: Score Category Description  0-20% Minimal Disability The patient can cope with most living activities. Usually no treatment is indicated apart from advice on lifting, sitting and exercise  21-40% Moderate Disability The patient experiences more pain and difficulty with sitting, lifting and standing. Travel and  social life are more difficult and they may be disabled from work. Personal care, sexual activity and sleeping are not grossly affected, and the patient can usually be managed by conservative means  41-60% Severe Disability Pain remains the main problem in this group, but activities of daily living are affected. These patients require a detailed investigation  61-80% Crippled Back pain impinges on all aspects of the patient's life. Positive intervention is required  81-100% Bed-bound These patients are either bed-bound or exaggerating their symptoms  Bluford FORBES Zoe DELENA Karon DELENA, et al. Surgery versus conservative management of stable thoracolumbar fracture: the PRESTO feasibility RCT. [place] ([place]): [employer]; 2021 Nov. [facility] Technology Assessment, No.  25.62.) Appendix 3, Oswestry Disability Index category descriptors. Available from: Findjewelers.cz  Minimally Clinically Important Difference (MCID) = 12.8%  COGNITION: Overall cognitive status: Within functional limits for tasks assessed     SENSATION: WFL  MUSCLE LENGTH: Tightness in  B quads, hip flexors, piriformis  POSTURE: rounded shoulders, forward head, flexed trunk , and weight shift right  PALPATION: Palpation: unremarkable in B hips and lumbar   LUMBAR ROM:   AROM eval  Flexion Full no pain  Extension Neutral  Right lateral flexion 50% pulls left  Left lateral flexion WFL   Right rotation 75%  Left rotation 80 %   (Blank rows = not tested)  LOWER EXTREMITY ROM:   WFL for tasks assessed  LOWER EXTREMITY MMT:  left lumbar weakness with hip flexion MMT R and with both  MMT Right eval Left eval  Hip flexion 4+ 4+  Hip extension 5 4+  Hip abduction 5 4+  Hip adduction    Hip internal rotation    Hip external rotation    Knee flexion 5 5  Knee extension 5 5  Ankle dorsiflexion 5 5  Ankle plantarflexion    Ankle inversion    Ankle eversion     (Blank rows = not  tested)  LUMBAR SPECIAL TESTS:  FABER test: Positive on Left Hip LLD left  - some relief with FABER + hip scour L  FUNCTIONAL TESTS:  5 times sit to stand: 14.82 sec  GAIT: Distance walked: 20 Assistive device utilized: None Level of assistance: Complete Independence Comments: antalgic decreased stance time L  TREATMENT DATE:                                                                                                                               12/30/23:  NuStep: Level 5x 4 minutes- PT present to discuss progress- Lt LE pain so stopped early Seated hamstring stretch 3x20 seconds bil  Seated piriformis 3x20 seconds  Seated ball squeeze: 5 hold x10 Supine TA activation with hip abduction: blue loop 2x10 Knee to chest 3x20 seconds bil Sidelying clam: 2x10  Low trunk rotation 3x20 seconds  Ball roll outs 3 ways x10 each x 5 hold   12/16/23  See pt ed and HEP    PATIENT EDUCATION:  Education details: PT eval findings, anticipated POC, initial HEP, and discussion of aquatics option and DN.   Person educated: Patient Education method: Explanation, Demonstration, Tactile cues, Verbal cues, and Handouts Education comprehension: verbalized understanding and returned demonstration  HOME EXERCISE PROGRAM: Access Code: [facility] URL: [email or web address] Date: 12/16/2023 Prepared by: Mliss  Exercises - Quadricep Stretch with Chair and Counter Support  - 2 x daily - 7 x weekly - 1 sets - 3 reps - 30 sec hold - Seated Table Piriformis Stretch  - 2 x daily - 7 x weekly - 1 sets - 3 reps - 30 sec hold  ASSESSMENT:  CLINICAL IMPRESSION: First time follow-up after evaluation.  Pt reports that his pain has been 8-10/10 in the Lt LE.  He tolerated exercise in the clinic today with some increased pain on NuStep and with clams on the Lt.  He does not tolerate standing due to leg pain. PT monitored throughout session for pain and technique.  Good form with core  activation.  Pt will benefit from skilled PT to address flexibility and strength to maximize function with daily tasks.    OBJECTIVE IMPAIRMENTS: Abnormal gait, decreased activity tolerance, decreased balance, decreased ROM, decreased strength, hypomobility, impaired flexibility, postural dysfunction, and pain.   ACTIVITY LIMITATIONS: carrying, lifting, standing, stairs, transfers, dressing, and locomotion level  PARTICIPATION LIMITATIONS: meal prep, cleaning, laundry, shopping, community activity, and yard work  PERSONAL FACTORS: Age, Time since onset of injury/illness/exacerbation, and 1 comorbidity: lumbar stenosis are also affecting patient's functional outcome.   REHAB POTENTIAL: Good  CLINICAL DECISION MAKING: Stable/uncomplicated  EVALUATION COMPLEXITY: Low  GOALS: Goals reviewed with patient? Yes  SHORT TERM GOALS: Target date: 01/13/2024   Patient will be independent with initial HEP.  Baseline:  Goal status: INITIAL  2.  Patient will report decreased radicular symptoms by 25% with standing and walking.  Baseline:  Goal status: INITIAL    LONG TERM GOALS: Target date: 02/10/2024   Patient will be independent with advanced/ongoing HEP to improve outcomes and carryover.  Baseline:  Goal status: INITIAL  2.  Patient will report 50% improvement in low back and radicular pain with with standing and walking to improve QOL.  Baseline:  Goal status: INITIAL  3.  Patient will demonstrate equal WB with sit to stand transfers.   Baseline:  Goal status: INITIAL  4.  Patient will demonstrate improved hip flexibility by being able to sit in a fig 4 position to don shoes/socks. Baseline:  Goal status: INITIAL  5.  Patient will score 12/50 on the Modified Oswestry demonstrating improved functional ability.  Baseline: 18 Goal status: INITIAL      PLAN:  PT FREQUENCY: 2x/week  PT DURATION: 8 weeks  PLANNED INTERVENTIONS: 97110-Therapeutic exercises, 97530-  Therapeutic activity, 97112- Neuromuscular re-education, 97535- Self Care, 02859- Manual therapy, [phone]- Gait training, [phone]- Aquatic Therapy, [phone]- Electrical stimulation (unattended), [phone]- Ionotophoresis 4mg /ml Dexamethasone , 79439 (1-2 muscles), 20561 (3+ muscles)- Dry Needling, Patient/Family education, Balance training, Stair training, Taping, Joint mobilization, Spinal mobilization, DME instructions, Cryotherapy, and Moist heat.  PLAN FOR NEXT SESSION: add to HEP if pt tolerated exercises today, B quad/hip flexor and piriformis stretching, left hip mobility - mobs/ROM, core strengthening  Burnard Joy, PT 12/30/23 12:27 PM

## 2023-12-31 NOTE — Therapy (Signed)
 OUTPATIENT PHYSICAL THERAPY TREATMENT   Patient Name: Gregory Marshall MRN: [id number] DOB:26-Jul-1950, 73 y.o., male Today's Date: 01/01/2024  END OF SESSION:  PT End of Session - 01/01/24 0806     Visit Number 3    Date for Recertification  02/10/24    Authorization Type cign[employer] managed MCR    Progress Note Due on Visit 10    PT Start Time 0800    PT Stop Time 0845    PT Time Calculation (min) 45 min    Activity Tolerance Patient tolerated treatment well    Behavior During Therapy WFL for tasks assessed/performed            Past Medical History:  Diagnosis Date   Arthritis    shoulders   Full dentures    History of kidney stones    Hyperlipidemia    Hypertension    Prostate cancer (HCC)    Renal calculus, left    Vertigo    hx of, x 1 - resolved   Past Surgical History:  Procedure Laterality Date   CARDIAC CATHETERIZATION     CERVICAL FUSION  1998   COLONOSCOPY  2008   LUMBAR LAMINECTOMY/DECOMPRESSION MICRODISCECTOMY Left 06/21/2012   Procedure: LUMBAR LAMINECTOMY/DECOMPRESSION MICRODISCECTOMY 1 LEVEL;  Surgeon: Lamar LELON Peaches, MD;  Location: [facility];  Service: Neurosurgery;  Laterality: Left;  LEFT L3-4 extraforaminal microdiskectomy   MULTIPLE TOOTH EXTRACTIONS     full dentures   RADIOACTIVE SEED IMPLANT N/A 07/07/2018   Procedure: RADIOACTIVE SEED IMPLANT/BRACHYTHERAPY IMPLANT;  Surgeon: Ottelin, Mark, MD;  Location: [facility];  Service: Urology;  Laterality: N/A;   SPACE OAR INSTILLATION N/A 07/07/2018   Procedure: SPACE OAR INSTILLATION;  Surgeon: Ottelin, Mark, MD;  Location: [facility];  Service: Urology;  Laterality: N/A;   Patient Active Problem List   Diagnosis Date Noted   Lumbar stenosis with neurogenic claudication 10/30/2018   Cataract 06/20/2018   Diverticulosis of colon 06/20/2018   Essential hypertension 04/07/2018   Carcinoma of prostate (HCC) 03/01/2018   Malignant neoplasm of prostate (HCC) 02/28/2018    Fecal occult blood test positive 03/11/2017   Anal fissure 03/11/2017   Constipation 03/11/2017   Hypertensive left ventricular hypertrophy 03/11/2017   Shoulder pain 03/11/2017   Obesity 05/21/2016   Prediabetes 05/20/2016   Vitamin D deficiency 05/20/2016   Primary erectile dysfunction 01/18/2016   Palpitations 12/24/2012   Left knee pain 08/05/2012   Sciatica 06/11/2012   Abnormal ECG 02/28/2009   Mixed hyperlipidemia 02/24/2009    PCP: Roanna Ezekiel NOVAK, MD   REFERRING PROVIDER: Roanna Ezekiel NOVAK, MD   REFERRING DIAG: M54.16 (ICD-10-CM) - Radiculopathy, lumbar region   Rationale for Evaluation and Treatment: Rehabilitation  THERAPY DIAG:  Radiculopathy, lumbar region  Stiffness of left hip, not elsewhere classified  Muscle weakness (generalized)  Other abnormalities of gait and mobility  ONSET DATE: 3-4 years  SUBJECTIVE:  SUBJECTIVE STATEMENT: No change in symptoms. Doing exercises. They all bother me.  From eval: Patient with 3-4 year history of left lumbar radiculopathy. His fusion helped the right sided pain. Walking is a burning sensation and 10/10 pain. Sitting is good.   PERTINENT HISTORY:  L3-5 fusion, HTN, OA  PAIN: 12/30/23 Are you having pain? Yes: NPRS scale: 8-10/10 Pain location: left buttock to ant/post thigh to calf Pain description: burning Aggravating factors: standind and walking Relieving factors: sitting  PRECAUTIONS: None  RED FLAGS: None   WEIGHT BEARING RESTRICTIONS: No  FALLS:  Has patient fallen in last 6 months? No  LIVING ENVIRONMENT: Lives with: lives with their spouse Lives in: House/apartment Stairs: No Has following equipment at home: Single point cane  OCCUPATION: semi-retired transportation driver  PLOF:  Independent  PATIENT GOALS: stop pain   NEXT MD VISIT: after PT  OBJECTIVE:  Note: Objective measures were completed at Evaluation unless otherwise noted.  DIAGNOSTIC FINDINGS:  MRI 05/17/23 IMPRESSION: 1. No change in the appearance of the lumbar spine since the prior MRI. 2. Status post L3-5 fusion. No stenosis at the operated levels. 3. Moderately severe central canal stenosis and moderate left foraminal narrowing at L2-3 is unchanged. 4. Mild bilateral foraminal narrowing at L5-S1 is unchanged. 5. Moderate appearing bilateral foraminal narrowing at T10-11.  PATIENT SURVEYS:  Modified Oswestry: 18 / 50 = 36.0 % MODIFIED OSWESTRY DISABILITY SCALE  Date: 12/16/23 Score  Pain intensity 3 =  Pain medication provides me with moderate relief from pain.  2. Personal care (washing, dressing, etc.) 1 =  I can take care of myself normally, but it increases my pain.  3. Lifting 3 = Pain prevents me from lifting heavy weights, but I can manage light to medium weights if they are conveniently positioned  4. Walking 3 =  Pain prevents me from walking more than  mile.  5. Sitting 0 =  I can sit in any chair as long as I like.  6. Standing 4 =  Pain prevents me from standing more than 10 minutes.  7. Sleeping 0 = Pain does not prevent me from sleeping well.  8. Social Life 2 = Pain prevents me from participating in more energetic activities (eg. sports, dancing).  9. Traveling 0 =  I can travel anywhere without increased pain.  10. Employment/ Homemaking 2 = I can perform most of my homemaking/job duties, but pain prevents me from performing more physically stressful activities (eg, lifting, vacuuming).  Total 18/50   Interpretation of scores: Score Category Description  0-20% Minimal Disability The patient can cope with most living activities. Usually no treatment is indicated apart from advice on lifting, sitting and exercise  21-40% Moderate Disability The patient experiences more pain  and difficulty with sitting, lifting and standing. Travel and social life are more difficult and they may be disabled from work. Personal care, sexual activity and sleeping are not grossly affected, and the patient can usually be managed by conservative means  41-60% Severe Disability Pain remains the main problem in this group, but activities of daily living are affected. These patients require a detailed investigation  61-80% Crippled Back pain impinges on all aspects of the patient's life. Positive intervention is required  81-100% Bed-bound These patients are either bed-bound or exaggerating their symptoms  Bluford FORBES Zoe DELENA Karon DELENA, et al. Surgery versus conservative management of stable thoracolumbar fracture: the PRESTO feasibility RCT. [place] ([place]): [employer]; 2021 Nov. [facility] Technology Assessment, No. 25.62.)  Appendix 3, Oswestry Disability Index category descriptors. Available from: [email or web address].cz  Minimally Clinically Important Difference (MCID) = 12.8%  COGNITION: Overall cognitive status: Within functional limits for tasks assessed     SENSATION: WFL  MUSCLE LENGTH: Tightness in  B quads, hip flexors, piriformis  POSTURE: rounded shoulders, forward head, flexed trunk , and weight shift right  PALPATION: Palpation: unremarkable in B hips and lumbar   LUMBAR ROM:   AROM eval  Flexion Full no pain  Extension Neutral  Right lateral flexion 50% pulls left  Left lateral flexion WFL   Right rotation 75%  Left rotation 80 %   (Blank rows = not tested)  LOWER EXTREMITY ROM:   WFL for tasks assessed  LOWER EXTREMITY MMT:  left lumbar weakness with hip flexion MMT R and with both  MMT Right eval Left eval  Hip flexion 4+ 4+  Hip extension 5 4+  Hip abduction 5 4+  Hip adduction    Hip internal rotation    Hip external rotation    Knee flexion 5 5  Knee extension 5 5  Ankle dorsiflexion 5 5  Ankle plantarflexion     Ankle inversion    Ankle eversion     (Blank rows = not tested)  LUMBAR SPECIAL TESTS:  FABER test: Positive on Left Hip LLD left  - some relief with FABER + hip scour L  FUNCTIONAL TESTS:  5 times sit to stand: 14.82 sec  GAIT: Distance walked: 20 Assistive device utilized: None Level of assistance: Complete Independence Comments: antalgic decreased stance time L  TREATMENT DATE:                                                                                                                                01/01/24:  NuStep: Level 5x 4 minutes- PT present to discuss progress Hip IR/ER x 5 B pain in ant thigh with IR L Prone over pillow - passive L knee flex stretch x 30 Prone over pillow - knee on yoga block - L knee flexion x10 for femoral nerve glide/quad stretch  Supine TA activation with hip abduction: yellow loop 2x10 Bridge with yellow loop 2x10 exhale on lift Seated hip flexor stretch L 3x30 sec Manual: Left hip mobilizations/distraction: Post mob in prone was painful from low back to gluteal; lateral distraction with belt 3x20 sec then pulses - no pain L psoas release in hooklying: 30 sec hold; then with active hip flexion x 10    12/30/23:  NuStep: Level 5x 4 minutes- PT present to discuss progress- Lt LE pain so stopped early Seated hamstring stretch 3x20 seconds bil  Seated piriformis 3x20 seconds  Seated ball squeeze: 5 hold x10 Supine TA activation with hip abduction: blue loop 2x10 Knee to chest 3x20 seconds bil Sidelying clam: 2x10  Low trunk rotation 3x20 seconds  Ball roll outs 3 ways x10 each x 5 hold   12/16/23  See pt ed and  HEP    PATIENT EDUCATION:  Education details: PT eval findings, anticipated POC, initial HEP, and discussion of aquatics option and DN.   Person educated: Patient Education method: Explanation, Demonstration, Tactile cues, Verbal cues, and Handouts Education comprehension: verbalized understanding and returned  demonstration  HOME EXERCISE PROGRAM: Access Code: [facility] URL: [email or web address] Date: 01/01/2024 Prepared by: Mliss  Exercises - Quadricep Stretch with Chair and Counter Support  - 2 x daily - 7 x weekly - 1 sets - 3 reps - 30 sec hold - Seated Table Piriformis Stretch  - 2 x daily - 7 x weekly - 1 sets - 3 reps - 30 sec hold - Lying Prone with 1 Pillow  - 3 x daily - 7 x weekly - 1 sets - 1 reps - 1-5 min hold - Seated Hip Flexor Stretch  - 2 x daily - 7 x weekly - 1 sets - 3 reps - 30-60 sec hold  ASSESSMENT:  CLINICAL IMPRESSION:  Patient reporting no relief so far. HEP bothers him. Last session didn't change anything; no better, no worse. We tried hip mobilizations today. PA mobs were painful, but lateral distraction gave some relief with IR/ER after. ER is still very limited. He has marked tightness in B hip flexors due to sitting so much, so we worked on stretching them in prone lying (over pillow) and seated. Psoas release was painful, but tolerated. HEP updated with stretches.   OBJECTIVE IMPAIRMENTS: Abnormal gait, decreased activity tolerance, decreased balance, decreased ROM, decreased strength, hypomobility, impaired flexibility, postural dysfunction, and pain.   ACTIVITY LIMITATIONS: carrying, lifting, standing, stairs, transfers, dressing, and locomotion level  PARTICIPATION LIMITATIONS: meal prep, cleaning, laundry, shopping, community activity, and yard work  PERSONAL FACTORS: Age, Time since onset of injury/illness/exacerbation, and 1 comorbidity: lumbar stenosis are also affecting patient's functional outcome.   REHAB POTENTIAL: Good  CLINICAL DECISION MAKING: Stable/uncomplicated  EVALUATION COMPLEXITY: Low  GOALS: Goals reviewed with patient? Yes  SHORT TERM GOALS: Target date: 01/13/2024   Patient will be independent with initial HEP.  Baseline:  Goal status: INITIAL  2.  Patient will report decreased radicular symptoms by 25% with  standing and walking.  Baseline:  Goal status: INITIAL    LONG TERM GOALS: Target date: 02/10/2024   Patient will be independent with advanced/ongoing HEP to improve outcomes and carryover.  Baseline:  Goal status: INITIAL  2.  Patient will report 50% improvement in low back and radicular pain with with standing and walking to improve QOL.  Baseline:  Goal status: INITIAL  3.  Patient will demonstrate equal WB with sit to stand transfers.   Baseline:  Goal status: INITIAL  4.  Patient will demonstrate improved hip flexibility by being able to sit in a fig 4 position to don shoes/socks. Baseline:  Goal status: INITIAL  5.  Patient will score 12/50 on the Modified Oswestry demonstrating improved functional ability.  Baseline: 18 Goal status: INITIAL      PLAN:  PT FREQUENCY: 2x/week  PT DURATION: 8 weeks  PLANNED INTERVENTIONS: 97110-Therapeutic exercises, 97530- Therapeutic activity, 97112- Neuromuscular re-education, 97535- Self Care, [id number]- Manual therapy, [phone]- Gait training, [phone]- Aquatic Therapy, [phone]- Electrical stimulation (unattended), [phone]- Ionotophoresis 4mg /ml Dexamethasone , 79439 (1-2 muscles), [phone] (3+ muscles)- Dry Needling, Patient/Family education, Balance training, Stair training, Taping, Joint mobilization, Spinal mobilization, DME instructions, Cryotherapy, and Moist heat.  PLAN FOR NEXT SESSION: add to HEP if pt tolerated exercises today, B quad/hip flexor and piriformis stretching, left hip mobility - mobs/ROM, core strengthening  Mliss Cummins, PT  01/01/24 8:53 AM

## 2024-01-01 ENCOUNTER — Encounter: Payer: Self-pay | Admitting: Physical Therapy

## 2024-01-01 ENCOUNTER — Ambulatory Visit: Admitting: Physical Therapy

## 2024-01-01 DIAGNOSIS — M5416 Radiculopathy, lumbar region: Secondary | ICD-10-CM

## 2024-01-01 DIAGNOSIS — R2689 Other abnormalities of gait and mobility: Secondary | ICD-10-CM

## 2024-01-01 DIAGNOSIS — M25652 Stiffness of left hip, not elsewhere classified: Secondary | ICD-10-CM

## 2024-01-01 DIAGNOSIS — M6281 Muscle weakness (generalized): Secondary | ICD-10-CM

## 2024-01-06 ENCOUNTER — Ambulatory Visit

## 2024-01-06 DIAGNOSIS — M5416 Radiculopathy, lumbar region: Secondary | ICD-10-CM | POA: Diagnosis not present

## 2024-01-06 DIAGNOSIS — R2689 Other abnormalities of gait and mobility: Secondary | ICD-10-CM

## 2024-01-06 DIAGNOSIS — M5459 Other low back pain: Secondary | ICD-10-CM

## 2024-01-06 DIAGNOSIS — R262 Difficulty in walking, not elsewhere classified: Secondary | ICD-10-CM

## 2024-01-06 DIAGNOSIS — M6281 Muscle weakness (generalized): Secondary | ICD-10-CM

## 2024-01-06 DIAGNOSIS — M25652 Stiffness of left hip, not elsewhere classified: Secondary | ICD-10-CM

## 2024-01-06 NOTE — Therapy (Signed)
 OUTPATIENT PHYSICAL THERAPY TREATMENT   Patient Name: Gregory Marshall MRN: [id number] DOB:08-21-1950, 73 y.o., male Today's Date: 01/06/2024  END OF SESSION:  PT End of Session - 01/06/24 1101     Visit Number 4    Date for Recertification  02/10/24    Authorization Type cigna managed MCR    Progress Note Due on Visit 10    PT Start Time 1015    PT Stop Time 1055    PT Time Calculation (min) 40 min    Activity Tolerance Patient tolerated treatment well    Behavior During Therapy WFL for tasks assessed/performed             Past Medical History:  Diagnosis Date   Arthritis    shoulders   Full dentures    History of kidney stones    Hyperlipidemia    Hypertension    Prostate cancer (HCC)    Renal calculus, left    Vertigo    hx of, x 1 - resolved   Past Surgical History:  Procedure Laterality Date   CARDIAC CATHETERIZATION     CERVICAL FUSION  1998   COLONOSCOPY  2008   LUMBAR LAMINECTOMY/DECOMPRESSION MICRODISCECTOMY Left 06/21/2012   Procedure: LUMBAR LAMINECTOMY/DECOMPRESSION MICRODISCECTOMY 1 LEVEL;  Surgeon: Lamar LELON Peaches, MD;  Location: [facility];  Service: Neurosurgery;  Laterality: Left;  LEFT L3-4 extraforaminal microdiskectomy   MULTIPLE TOOTH EXTRACTIONS     full dentures   RADIOACTIVE SEED IMPLANT N/A 07/07/2018   Procedure: RADIOACTIVE SEED IMPLANT/BRACHYTHERAPY IMPLANT;  Surgeon: Ottelin, Mark, MD;  Location: [facility];  Service: Urology;  Laterality: N/A;   SPACE OAR INSTILLATION N/A 07/07/2018   Procedure: SPACE OAR INSTILLATION;  Surgeon: Ottelin, Mark, MD;  Location: [facility];  Service: Urology;  Laterality: N/A;   Patient Active Problem List   Diagnosis Date Noted   Lumbar stenosis with neurogenic claudication 10/30/2018   Cataract 06/20/2018   Diverticulosis of colon 06/20/2018   Essential hypertension 04/07/2018   Carcinoma of prostate (HCC) 03/01/2018   Malignant neoplasm of prostate (HCC) 02/28/2018    Fecal occult blood test positive 03/11/2017   Anal fissure 03/11/2017   Constipation 03/11/2017   Hypertensive left ventricular hypertrophy 03/11/2017   Shoulder pain 03/11/2017   Obesity 05/21/2016   Prediabetes 05/20/2016   Vitamin D deficiency 05/20/2016   Primary erectile dysfunction 01/18/2016   Palpitations 12/24/2012   Left knee pain 08/05/2012   Sciatica 06/11/2012   Abnormal ECG 02/28/2009   Mixed hyperlipidemia 02/24/2009    PCP: Roanna Ezekiel NOVAK, MD   REFERRING PROVIDER: Roanna Ezekiel NOVAK, MD   REFERRING DIAG: M54.16 (ICD-10-CM) - Radiculopathy, lumbar region   Rationale for Evaluation and Treatment: Rehabilitation  THERAPY DIAG:  Radiculopathy, lumbar region  Stiffness of left hip, not elsewhere classified  Muscle weakness (generalized)  Other abnormalities of gait and mobility  Other low back pain  Difficulty in walking, not elsewhere classified  ONSET DATE: 3-4 years  SUBJECTIVE:  SUBJECTIVE STATEMENT: My pain is still the same.  No changes with PT.    From eval: Patient with 3-4 year history of left lumbar radiculopathy. His fusion helped the right sided pain. Walking is a burning sensation and 10/10 pain. Sitting is good.   PERTINENT HISTORY:  L3-5 fusion, HTN, OA  PAIN: 01/06/24 Are you having pain? Yes: NPRS scale: 8-10/10 Pain location: left buttock to ant/post thigh to calf Pain description: burning Aggravating factors: standind and walking Relieving factors: sitting  PRECAUTIONS: None  RED FLAGS: None   WEIGHT BEARING RESTRICTIONS: No  FALLS:  Has patient fallen in last 6 months? No  LIVING ENVIRONMENT: Lives with: lives with their spouse Lives in: House/apartment Stairs: No Has following equipment at home: Single point  cane  OCCUPATION: semi-retired transportation driver  PLOF: Independent  PATIENT GOALS: stop pain   NEXT MD VISIT: after PT  OBJECTIVE:  Note: Objective measures were completed at Evaluation unless otherwise noted.  DIAGNOSTIC FINDINGS:  MRI 05/17/23 IMPRESSION: 1. No change in the appearance of the lumbar spine since the prior MRI. 2. Status post L3-5 fusion. No stenosis at the operated levels. 3. Moderately severe central canal stenosis and moderate left foraminal narrowing at L2-3 is unchanged. 4. Mild bilateral foraminal narrowing at L5-S1 is unchanged. 5. Moderate appearing bilateral foraminal narrowing at T10-11.  PATIENT SURVEYS:  Modified Oswestry: 18 / 50 = 36.0 % MODIFIED OSWESTRY DISABILITY SCALE  Date: 12/16/23 Score  Pain intensity 3 =  Pain medication provides me with moderate relief from pain.  2. Personal care (washing, dressing, etc.) 1 =  I can take care of myself normally, but it increases my pain.  3. Lifting 3 = Pain prevents me from lifting heavy weights, but I can manage light to medium weights if they are conveniently positioned  4. Walking 3 =  Pain prevents me from walking more than  mile.  5. Sitting 0 =  I can sit in any chair as long as I like.  6. Standing 4 =  Pain prevents me from standing more than 10 minutes.  7. Sleeping 0 = Pain does not prevent me from sleeping well.  8. Social Life 2 = Pain prevents me from participating in more energetic activities (eg. sports, dancing).  9. Traveling 0 =  I can travel anywhere without increased pain.  10. Employment/ Homemaking 2 = I can perform most of my homemaking/job duties, but pain prevents me from performing more physically stressful activities (eg, lifting, vacuuming).  Total 18/50   Interpretation of scores: Score Category Description  0-20% Minimal Disability The patient can cope with most living activities. Usually no treatment is indicated apart from advice on lifting, sitting and exercise   21-40% Moderate Disability The patient experiences more pain and difficulty with sitting, lifting and standing. Travel and social life are more difficult and they may be disabled from work. Personal care, sexual activity and sleeping are not grossly affected, and the patient can usually be managed by conservative means  41-60% Severe Disability Pain remains the main problem in this group, but activities of daily living are affected. These patients require a detailed investigation  61-80% Crippled Back pain impinges on all aspects of the patient's life. Positive intervention is required  81-100% Bed-bound These patients are either bed-bound or exaggerating their symptoms  Bluford FORBES Zoe DELENA Karon DELENA, et al. Surgery versus conservative management of stable thoracolumbar fracture: the PRESTO feasibility RCT. [place] ([place]): [employer]; 2021 Nov. [facility] Technology  Assessment, No. 25.62.) Appendix 3, Oswestry Disability Index category descriptors. Available from: [email or web address].cz  Minimally Clinically Important Difference (MCID) = 12.8%  COGNITION: Overall cognitive status: Within functional limits for tasks assessed     SENSATION: WFL  MUSCLE LENGTH: Tightness in  B quads, hip flexors, piriformis  POSTURE: rounded shoulders, forward head, flexed trunk , and weight shift right  PALPATION: Palpation: unremarkable in B hips and lumbar   LUMBAR ROM:   AROM eval  Flexion Full no pain  Extension Neutral  Right lateral flexion 50% pulls left  Left lateral flexion WFL   Right rotation 75%  Left rotation 80 %   (Blank rows = not tested)  LOWER EXTREMITY ROM:   WFL for tasks assessed  LOWER EXTREMITY MMT:  left lumbar weakness with hip flexion MMT R and with both  MMT Right eval Left eval  Hip flexion 4+ 4+  Hip extension 5 4+  Hip abduction 5 4+  Hip adduction    Hip internal rotation    Hip external rotation    Knee flexion 5 5  Knee  extension 5 5  Ankle dorsiflexion 5 5  Ankle plantarflexion    Ankle inversion    Ankle eversion     (Blank rows = not tested)  LUMBAR SPECIAL TESTS:  FABER test: Positive on Left Hip LLD left  - some relief with FABER + hip scour L  FUNCTIONAL TESTS:  5 times sit to stand: 14.82 sec  GAIT: Distance walked: 20 Assistive device utilized: None Level of assistance: Complete Independence Comments: antalgic decreased stance time L  TREATMENT DATE:                                                                                                                                01/06/24:  NuStep: Level 5x 5 minutes- PT present to discuss progress Low trunk rotation x10 Rt and Lt Seated figure 4 2x20 seconds  Supine hamstring stretch with strap: 3x20 seconds  Prone over pillow - passive L knee flex stretch 3x20 seconds Prone over pillow - knee on yoga block - L knee flexion x10 for femoral nerve glide/quad stretch  Prone over pillow-hip extension Rtx10, Lt x6 (fatigue) Supine TA activation with hip abduction: yellow loop 2x10 Bridge with yellow loop 2x10 exhale on lift Sidelying clam: x10 bil each  Sit to stand 5# kettlebell: x8 with TA activation    01/01/24:  NuStep: Level 5x 4 minutes- PT present to discuss progress Hip IR/ER x 5 B pain in ant thigh with IR L Prone over pillow - passive L knee flex stretch x 30 Prone over pillow - knee on yoga block - L knee flexion x10 for femoral nerve glide/quad stretch  Supine TA activation with hip abduction: yellow loop 2x10 Bridge with yellow loop 2x10 exhale on lift Seated hip flexor stretch L 3x30 sec Manual: Left hip mobilizations/distraction: Post mob in prone was painful from low back to  gluteal; lateral distraction with belt 3x20 sec then pulses - no pain L psoas release in hooklying: 30 sec hold; then with active hip flexion x 10    12/30/23:  NuStep: Level 5x 4 minutes- PT present to discuss progress- Lt LE pain so stopped  early Seated hamstring stretch 3x20 seconds bil  Seated piriformis 3x20 seconds  Seated ball squeeze: 5 hold x10 Supine TA activation with hip abduction: blue loop 2x10 Knee to chest 3x20 seconds bil Sidelying clam: 2x10  Low trunk rotation 3x20 seconds  Ball roll outs 3 ways x10 each x 5 hold      PATIENT EDUCATION:  Education details: PT eval findings, anticipated POC, initial HEP, and discussion of aquatics option and DN.   Person educated: Patient Education method: Explanation, Demonstration, Tactile cues, Verbal cues, and Handouts Education comprehension: verbalized understanding and returned demonstration  HOME EXERCISE PROGRAM: Access Code: [facility] URL: [email or web address] Date: 01/01/2024 Prepared by: Mliss  Exercises - Quadricep Stretch with Chair and Counter Support  - 2 x daily - 7 x weekly - 1 sets - 3 reps - 30 sec hold - Seated Table Piriformis Stretch  - 2 x daily - 7 x weekly - 1 sets - 3 reps - 30 sec hold - Lying Prone with 1 Pillow  - 3 x daily - 7 x weekly - 1 sets - 1 reps - 1-5 min hold - Seated Hip Flexor Stretch  - 2 x daily - 7 x weekly - 1 sets - 3 reps - 30-60 sec hold  ASSESSMENT:  CLINICAL IMPRESSION:  Patient reporting no relief so far. No change in symptoms after manual last session.  Pt is compliant with HEP and new stretches issued last session.  Pt tolerated session today without increased symptoms as he was 8/10 reported pain at start of session.  Pt monitored throughout session for pain, tolerance and technique.  Patient will benefit from skilled PT to address the below impairments and improve overall function.    OBJECTIVE IMPAIRMENTS: Abnormal gait, decreased activity tolerance, decreased balance, decreased ROM, decreased strength, hypomobility, impaired flexibility, postural dysfunction, and pain.   ACTIVITY LIMITATIONS: carrying, lifting, standing, stairs, transfers, dressing, and locomotion level  PARTICIPATION  LIMITATIONS: meal prep, cleaning, laundry, shopping, community activity, and yard work  PERSONAL FACTORS: Age, Time since onset of injury/illness/exacerbation, and 1 comorbidity: lumbar stenosis are also affecting patient's functional outcome.   REHAB POTENTIAL: Good  CLINICAL DECISION MAKING: Stable/uncomplicated  EVALUATION COMPLEXITY: Low  GOALS: Goals reviewed with patient? Yes  SHORT TERM GOALS: Target date: 01/13/2024   Patient will be independent with initial HEP.  Baseline: compliant with HEP (01/06/24) Goal status: MET  2.  Patient will report decreased radicular symptoms by 25% with standing and walking.  Baseline: no change (01/06/24) Goal status: In progress     LONG TERM GOALS: Target date: 02/10/2024   Patient will be independent with advanced/ongoing HEP to improve outcomes and carryover.  Baseline:  Goal status: INITIAL  2.  Patient will report 50% improvement in low back and radicular pain with with standing and walking to improve QOL.  Baseline:  Goal status: INITIAL  3.  Patient will demonstrate equal WB with sit to stand transfers.   Baseline:  Goal status: INITIAL  4.  Patient will demonstrate improved hip flexibility by being able to sit in a fig 4 position to don shoes/socks. Baseline:  Goal status: INITIAL  5.  Patient will score 12/50 on the Modified Oswestry demonstrating improved functional ability.  Baseline: 18 Goal status: INITIAL      PLAN:  PT FREQUENCY: 2x/week  PT DURATION: 8 weeks  PLANNED INTERVENTIONS: 97110-Therapeutic exercises, 97530- Therapeutic activity, 97112- Neuromuscular re-education, 97535- Self Care, [id number]- Manual therapy, [phone]- Gait training, [phone]- Aquatic Therapy, [phone]- Electrical stimulation (unattended), [phone]- Ionotophoresis 4mg /ml Dexamethasone , [id number] (1-2 muscles), [phone] (3+ muscles)- Dry Needling, Patient/Family education, Balance training, Stair training, Taping, Joint mobilization, Spinal mobilization,  DME instructions, Cryotherapy, and Moist heat.  PLAN FOR NEXT SESSION: exercise as pt tolerates, B quad/hip flexor and piriformis stretching, left hip mobility - mobs/ROM, core strengthening  Burnard Joy, PT 01/06/24 11:02 AM

## 2024-01-08 ENCOUNTER — Encounter: Payer: Self-pay | Admitting: Physical Therapy

## 2024-01-08 ENCOUNTER — Ambulatory Visit: Admitting: Physical Therapy

## 2024-01-08 DIAGNOSIS — M25652 Stiffness of left hip, not elsewhere classified: Secondary | ICD-10-CM

## 2024-01-08 DIAGNOSIS — M5416 Radiculopathy, lumbar region: Secondary | ICD-10-CM | POA: Diagnosis not present

## 2024-01-08 DIAGNOSIS — R2689 Other abnormalities of gait and mobility: Secondary | ICD-10-CM

## 2024-01-08 DIAGNOSIS — M6281 Muscle weakness (generalized): Secondary | ICD-10-CM

## 2024-01-08 NOTE — Therapy (Signed)
 OUTPATIENT PHYSICAL THERAPY TREATMENT   Patient Name: Gregory Marshall MRN: [id number] DOB:[birth year], 73 y.o., male Today's Date: 01/08/2024  END OF SESSION:  PT End of Session - 01/08/24 0843     Visit Number 5    Date for Recertification  02/10/24    Authorization Type cign[employer] managed MCR    Progress Note Due on Visit 10    PT Start Time [phone]    PT Stop Time 0930    PT Time Calculation (min) 47 min    Activity Tolerance Patient tolerated treatment well    Behavior During Therapy WFL for tasks assessed/performed             Past Medical History:  Diagnosis Date   Arthritis    shoulders   Full dentures    History of kidney stones    Hyperlipidemia    Hypertension    Prostate cancer (HCC)    Renal calculus, left    Vertigo    hx of, x 1 - resolved   Past Surgical History:  Procedure Laterality Date   CARDIAC CATHETERIZATION     CERVICAL FUSION  1998   COLONOSCOPY  2008   LUMBAR LAMINECTOMY/DECOMPRESSION MICRODISCECTOMY Left 06/21/2012   Procedure: LUMBAR LAMINECTOMY/DECOMPRESSION MICRODISCECTOMY 1 LEVEL;  Surgeon: Lamar LELON Peaches, MD;  Location: [facility];  Service: Neurosurgery;  Laterality: Left;  LEFT L3-4 extraforaminal microdiskectomy   MULTIPLE TOOTH EXTRACTIONS     full dentures   RADIOACTIVE SEED IMPLANT N/A 07/07/2018   Procedure: RADIOACTIVE SEED IMPLANT/BRACHYTHERAPY IMPLANT;  Surgeon: Ottelin, Mark, MD;  Location: [facility];  Service: Urology;  Laterality: N/A;   SPACE OAR INSTILLATION N/A 07/07/2018   Procedure: SPACE OAR INSTILLATION;  Surgeon: Ottelin, Mark, MD;  Location: [facility];  Service: Urology;  Laterality: N/A;   Patient Active Problem List   Diagnosis Date Noted   Lumbar stenosis with neurogenic claudication 10/30/2018   Cataract 06/20/2018   Diverticulosis of colon 06/20/2018   Essential hypertension 04/07/2018   Carcinoma of prostate (HCC) 03/01/2018   Malignant neoplasm of prostate (HCC) 02/28/2018    Fecal occult blood test positive 03/11/2017   Anal fissure 03/11/2017   Constipation 03/11/2017   Hypertensive left ventricular hypertrophy 03/11/2017   Shoulder pain 03/11/2017   Obesity 05/21/2016   Prediabetes 05/20/2016   Vitamin D deficiency 05/20/2016   Primary erectile dysfunction 01/18/2016   Palpitations 12/24/2012   Left knee pain 08/05/2012   Sciatica 06/11/2012   Abnormal ECG 02/28/2009   Mixed hyperlipidemia 02/24/2009    PCP: Roanna Ezekiel NOVAK, MD   REFERRING PROVIDER: Roanna Ezekiel NOVAK, MD   REFERRING DIAG: M54.16 (ICD-10-CM) - Radiculopathy, lumbar region   Rationale for Evaluation and Treatment: Rehabilitation  THERAPY DIAG:  Radiculopathy, lumbar region  Stiffness of left hip, not elsewhere classified  Muscle weakness (generalized)  Other abnormalities of gait and mobility  ONSET DATE: 3-4 years  SUBJECTIVE:  SUBJECTIVE STATEMENT: This morning is really rough. The cold weather is not my friend.  From eval: Patient with 3-4 year history of left lumbar radiculopathy. His fusion helped the right sided pain. Walking is a burning sensation and 10/10 pain. Sitting is good.   PERTINENT HISTORY:  L3-5 fusion, HTN, OA  PAIN: 01/08/2024  Are you having pain? Yes: NPRS scale: 8-10/10 Pain location: left buttock to ant/post thigh to calf Pain description: burning Aggravating factors: standind and walking Relieving factors: sitting  PRECAUTIONS: None  RED FLAGS: None   WEIGHT BEARING RESTRICTIONS: No  FALLS:  Has patient fallen in last 6 months? No  LIVING ENVIRONMENT: Lives with: lives with their spouse Lives in: House/apartment Stairs: No Has following equipment at home: Single point cane  OCCUPATION: semi-retired transportation driver  PLOF:  Independent  PATIENT GOALS: stop pain   NEXT MD VISIT: after PT  OBJECTIVE:  Note: Objective measures were completed at Evaluation unless otherwise noted.  DIAGNOSTIC FINDINGS:  MRI 05/17/23 IMPRESSION: 1. No change in the appearance of the lumbar spine since the prior MRI. 2. Status post L3-5 fusion. No stenosis at the operated levels. 3. Moderately severe central canal stenosis and moderate left foraminal narrowing at L2-3 is unchanged. 4. Mild bilateral foraminal narrowing at L5-S1 is unchanged. 5. Moderate appearing bilateral foraminal narrowing at T10-11.  PATIENT SURVEYS:  Modified Oswestry: 18 / 50 = 36.0 % MODIFIED OSWESTRY DISABILITY SCALE  Date: 12/16/23 Score  Pain intensity 3 =  Pain medication provides me with moderate relief from pain.  2. Personal care (washing, dressing, etc.) 1 =  I can take care of myself normally, but it increases my pain.  3. Lifting 3 = Pain prevents me from lifting heavy weights, but I can manage light to medium weights if they are conveniently positioned  4. Walking 3 =  Pain prevents me from walking more than  mile.  5. Sitting 0 =  I can sit in any chair as long as I like.  6. Standing 4 =  Pain prevents me from standing more than 10 minutes.  7. Sleeping 0 = Pain does not prevent me from sleeping well.  8. Social Life 2 = Pain prevents me from participating in more energetic activities (eg. sports, dancing).  9. Traveling 0 =  I can travel anywhere without increased pain.  10. Employment/ Homemaking 2 = I can perform most of my homemaking/job duties, but pain prevents me from performing more physically stressful activities (eg, lifting, vacuuming).  Total 18/50   Interpretation of scores: Score Category Description  0-20% Minimal Disability The patient can cope with most living activities. Usually no treatment is indicated apart from advice on lifting, sitting and exercise  21-40% Moderate Disability The patient experiences more pain  and difficulty with sitting, lifting and standing. Travel and social life are more difficult and they may be disabled from work. Personal care, sexual activity and sleeping are not grossly affected, and the patient can usually be managed by conservative means  41-60% Severe Disability Pain remains the main problem in this group, but activities of daily living are affected. These patients require a detailed investigation  61-80% Crippled Back pain impinges on all aspects of the patients life. Positive intervention is required  81-100% Bed-bound These patients are either bed-bound or exaggerating their symptoms  Bluford FORBES Zoe DELENA Karon DELENA, et al. Surgery versus conservative management of stable thoracolumbar fracture: the PRESTO feasibility RCT. [place] ([place]): [employer]; 2021 Nov. [facility] Technology  Assessment, No. 25.62.) Appendix 3, Oswestry Disability Index category descriptors. Available from: [email or web address].cz  Minimally Clinically Important Difference (MCID) = 12.8%  COGNITION: Overall cognitive status: Within functional limits for tasks assessed     SENSATION: WFL  MUSCLE LENGTH: Tightness in  B quads, hip flexors, piriformis  POSTURE: rounded shoulders, forward head, flexed trunk , and weight shift right  PALPATION: Palpation: unremarkable in B hips and lumbar   LUMBAR ROM:   AROM eval  Flexion Full no pain  Extension Neutral  Right lateral flexion 50% pulls left  Left lateral flexion WFL   Right rotation 75%  Left rotation 80 %   (Blank rows = not tested)  LOWER EXTREMITY ROM:   WFL for tasks assessed  LOWER EXTREMITY MMT:  left lumbar weakness with hip flexion MMT R and with both  MMT Right eval Left eval  Hip flexion 4+ 4+  Hip extension 5 4+  Hip abduction 5 4+  Hip adduction    Hip internal rotation    Hip external rotation    Knee flexion 5 5  Knee extension 5 5  Ankle dorsiflexion 5 5  Ankle plantarflexion     Ankle inversion    Ankle eversion     (Blank rows = not tested)  LUMBAR SPECIAL TESTS:  FABER test: Positive on Left Hip LLD left  - some relief with FABER + hip scour L  FUNCTIONAL TESTS:  5 times sit to stand: 14.82 sec  GAIT: Distance walked: 20 Assistive device utilized: None Level of assistance: Complete Independence Comments: antalgic decreased stance time L  TREATMENT DATE:                                                                                                                               01/08/24:  NuStep: Level 5x 5 minutes- PT present to discuss progress Low trunk rotation x10 Rt and Lt Supine hamstring stretch with strap: 3x20 seconds  Prone knee flexion x 10 B for quad stretch  Prone over pillow-hip extension Rtx10, Lt x5 (pain today) Supine TA activation with hip abduction: yellow loop 2x10 Bridge with yellow loop 2x10 exhale on lift Sidelying clam: x10 bil each  (used yellow loop on R side, too difficult on L) Sit to stand 5# kettlebell: x 5 with TA activation  Sit to stand 5# KB stagerred too painful Plank on knees/elbows x 3 reps max hold Primal push up x 3 reps Standing L leg hang Discussion of use of cane/walking stick followed by walking with SPC in R hand.  Discussion of use of heat versus ice   01/06/24:  NuStep: Level 5x 5 minutes- PT present to discuss progress Low trunk rotation x10 Rt and Lt Seated figure 4 2x20 seconds  Supine hamstring stretch with strap: 3x20 seconds  Prone over pillow - passive L knee flex stretch 3x20 seconds Prone over pillow - knee on yoga block - L knee flexion x10 for femoral  nerve glide/quad stretch  Prone over pillow-hip extension Rtx10, Lt x6 (fatigue) Supine TA activation with hip abduction: yellow loop 2x10 Bridge with yellow loop 2x10 exhale on lift Sidelying clam: x10 bil each  Sit to stand 5# kettlebell: x8 with TA activation    01/01/24:  NuStep: Level 5x 4 minutes- PT present to discuss  progress Hip IR/ER x 5 B pain in ant thigh with IR L Prone over pillow - passive L knee flex stretch x 30 Prone over pillow - knee on yoga block - L knee flexion x10 for femoral nerve glide/quad stretch  Supine TA activation with hip abduction: yellow loop 2x10 Bridge with yellow loop 2x10 exhale on lift Seated hip flexor stretch L 3x30 sec Manual: Left hip mobilizations/distraction: Post mob in prone was painful from low back to gluteal; lateral distraction with belt 3x20 sec then pulses - no pain L psoas release in hooklying: 30 sec hold; then with active hip flexion x 10    12/30/23:  NuStep: Level 5x 4 minutes- PT present to discuss progress- Lt LE pain so stopped early Seated hamstring stretch 3x20 seconds bil  Seated piriformis 3x20 seconds  Seated ball squeeze: 5 hold x10 Supine TA activation with hip abduction: blue loop 2x10 Knee to chest 3x20 seconds bil Sidelying clam: 2x10  Low trunk rotation 3x20 seconds  Ball roll outs 3 ways x10 each x 5 hold      PATIENT EDUCATION:  Education details: PT eval findings, anticipated POC, initial HEP, and discussion of aquatics option and DN.   Person educated: Patient Education method: Explanation, Demonstration, Tactile cues, Verbal cues, and Handouts Education comprehension: verbalized understanding and returned demonstration  HOME EXERCISE PROGRAM: Access Code: [facility] URL: [email or web address] Date: 01/01/2024 Prepared by: Mliss  Exercises - Quadricep Stretch with Chair and Counter Support  - 2 x daily - 7 x weekly - 1 sets - 3 reps - 30 sec hold - Seated Table Piriformis Stretch  - 2 x daily - 7 x weekly - 1 sets - 3 reps - 30 sec hold - Lying Prone with 1 Pillow  - 3 x daily - 7 x weekly - 1 sets - 1 reps - 1-5 min hold - Seated Hip Flexor Stretch  - 2 x daily - 7 x weekly - 1 sets - 3 reps - 30-60 sec hold  ASSESSMENT:  CLINICAL IMPRESSION: Mr. Bulkley reports ongoing 10/10 pain. Sitting is the  only thing that gives patient relief. He states he makes himself move. We discussed whether he was getting anything out of therapy and he wants to continue to work on strengthening. He is limited with PT due to pain in standing, but did well with core strengthening today in prone and quadruped. He was interested in trying a [facility] and did well with it. He reported feeling less pressure through the L LE and plans to acquire a cane soon. He did get some relief with standing leg hang as well.   OBJECTIVE IMPAIRMENTS: Abnormal gait, decreased activity tolerance, decreased balance, decreased ROM, decreased strength, hypomobility, impaired flexibility, postural dysfunction, and pain.   ACTIVITY LIMITATIONS: carrying, lifting, standing, stairs, transfers, dressing, and locomotion level  PARTICIPATION LIMITATIONS: meal prep, cleaning, laundry, shopping, community activity, and yard work  PERSONAL FACTORS: Age, Time since onset of injury/illness/exacerbation, and 1 comorbidity: lumbar stenosis are also affecting patient's functional outcome.   REHAB POTENTIAL: Good  CLINICAL DECISION MAKING: Stable/uncomplicated  EVALUATION COMPLEXITY: Low  GOALS: Goals reviewed with patient? Yes  SHORT  TERM GOALS: Target date: 01/13/2024   Patient will be independent with initial HEP.  Baseline: compliant with HEP (01/06/24) Goal status: MET  2.  Patient will report decreased radicular symptoms by 25% with standing and walking.  Baseline: no change (01/06/24) Goal status: In progress     LONG TERM GOALS: Target date: 02/10/2024   Patient will be independent with advanced/ongoing HEP to improve outcomes and carryover.  Baseline:  Goal status: INITIAL  2.  Patient will report 50% improvement in low back and radicular pain with with standing and walking to improve QOL.  Baseline:  Goal status: INITIAL  3.  Patient will demonstrate equal WB with sit to stand transfers.   Baseline:  Goal status: INITIAL  4.   Patient will demonstrate improved hip flexibility by being able to sit in a fig 4 position to don shoes/socks. Baseline:  Goal status: INITIAL  5.  Patient will score 12/50 on the Modified Oswestry demonstrating improved functional ability.  Baseline: 18 Goal status: INITIAL      PLAN:  PT FREQUENCY: 2x/week  PT DURATION: 8 weeks  PLANNED INTERVENTIONS: 97110-Therapeutic exercises, 97530- Therapeutic activity, 97112- Neuromuscular re-education, 97535- Self Care, [id number]- Manual therapy, [phone]- Gait training, [phone]- Aquatic Therapy, [phone]- Electrical stimulation (unattended), [phone]- Ionotophoresis 4mg /ml Dexamethasone , 79439 (1-2 muscles), [phone] (3+ muscles)- Dry Needling, Patient/Family education, Balance training, Stair training, Taping, Joint mobilization, Spinal mobilization, DME instructions, Cryotherapy, and Moist heat.  PLAN FOR NEXT SESSION: Continue with gait training with cane, exercise as pt tolerates, continue with planks and similar exercises, B quad/hip flexor and piriformis stretching  Mliss Cummins, PT  01/08/24 11:37 AM

## 2024-01-13 ENCOUNTER — Encounter: Payer: Self-pay | Admitting: Physical Therapy

## 2024-01-13 ENCOUNTER — Ambulatory Visit: Admitting: Physical Therapy

## 2024-01-13 DIAGNOSIS — R2689 Other abnormalities of gait and mobility: Secondary | ICD-10-CM

## 2024-01-13 DIAGNOSIS — M6281 Muscle weakness (generalized): Secondary | ICD-10-CM

## 2024-01-13 DIAGNOSIS — M5416 Radiculopathy, lumbar region: Secondary | ICD-10-CM | POA: Diagnosis not present

## 2024-01-13 DIAGNOSIS — M25652 Stiffness of left hip, not elsewhere classified: Secondary | ICD-10-CM

## 2024-01-13 NOTE — Therapy (Signed)
 OUTPATIENT PHYSICAL THERAPY TREATMENT   Patient Name: Gregory Marshall MRN: [id number] DOB:27-Jun-1950, 73 y.o., male Today's Date: 01/13/2024  END OF SESSION:  PT End of Session - 01/13/24 0848     Visit Number 6    Authorization Type cigna managed MCR    Progress Note Due on Visit 10    PT Start Time 0847    PT Stop Time 0928    PT Time Calculation (min) 41 min    Activity Tolerance Patient tolerated treatment well    Behavior During Therapy WFL for tasks assessed/performed              Past Medical History:  Diagnosis Date   Arthritis    shoulders   Full dentures    History of kidney stones    Hyperlipidemia    Hypertension    Prostate cancer (HCC)    Renal calculus, left    Vertigo    hx of, x 1 - resolved   Past Surgical History:  Procedure Laterality Date   CARDIAC CATHETERIZATION     CERVICAL FUSION  1998   COLONOSCOPY  2008   LUMBAR LAMINECTOMY/DECOMPRESSION MICRODISCECTOMY Left 06/21/2012   Procedure: LUMBAR LAMINECTOMY/DECOMPRESSION MICRODISCECTOMY 1 LEVEL;  Surgeon: Lamar LELON Peaches, MD;  Location: [facility];  Service: Neurosurgery;  Laterality: Left;  LEFT L3-4 extraforaminal microdiskectomy   MULTIPLE TOOTH EXTRACTIONS     full dentures   RADIOACTIVE SEED IMPLANT N/A 07/07/2018   Procedure: RADIOACTIVE SEED IMPLANT/BRACHYTHERAPY IMPLANT;  Surgeon: Ottelin, Mark, MD;  Location: [facility] [place];  Service: Urology;  Laterality: N/A;   SPACE OAR INSTILLATION N/A 07/07/2018   Procedure: SPACE OAR INSTILLATION;  Surgeon: Ottelin, Mark, MD;  Location: [facility];  Service: Urology;  Laterality: N/A;   Patient Active Problem List   Diagnosis Date Noted   Lumbar stenosis with neurogenic claudication 10/30/2018   Cataract 06/20/2018   Diverticulosis of colon 06/20/2018   Essential hypertension 04/07/2018   Carcinoma of prostate (HCC) 03/01/2018   Malignant neoplasm of prostate (HCC) 02/28/2018   Fecal occult blood test positive  03/11/2017   Anal fissure 03/11/2017   Constipation 03/11/2017   Hypertensive left ventricular hypertrophy 03/11/2017   Shoulder pain 03/11/2017   Obesity 05/21/2016   Prediabetes 05/20/2016   Vitamin D deficiency 05/20/2016   Primary erectile dysfunction 01/18/2016   Palpitations 12/24/2012   Left knee pain 08/05/2012   Sciatica 06/11/2012   Abnormal ECG 02/28/2009   Mixed hyperlipidemia 02/24/2009    PCP: Roanna Ezekiel NOVAK, MD   REFERRING PROVIDER: Roanna Ezekiel NOVAK, MD   REFERRING DIAG: M54.16 (ICD-10-CM) - Radiculopathy, lumbar region   Rationale for Evaluation and Treatment: Rehabilitation  THERAPY DIAG:  Radiculopathy, lumbar region  Stiffness of left hip, not elsewhere classified  Muscle weakness (generalized)  Other abnormalities of gait and mobility  ONSET DATE: 3-4 years  SUBJECTIVE:  SUBJECTIVE STATEMENT: Doing a little better. Haven't gotten a cane yet.  From eval: Patient with 3-4 year history of left lumbar radiculopathy. His fusion helped the right sided pain. Walking is a burning sensation and 10/10 pain. Sitting is good.   PERTINENT HISTORY:  L3-5 fusion, HTN, OA  PAIN: 01/13/2024  Are you having pain? Yes: NPRS scale: 7-8/10 Pain location: left buttock to ant/post thigh to calf Pain description: burning Aggravating factors: standind and walking Relieving factors: sitting  PRECAUTIONS: None  RED FLAGS: None   WEIGHT BEARING RESTRICTIONS: No  FALLS:  Has patient fallen in last 6 months? No  LIVING ENVIRONMENT: Lives with: lives with their spouse Lives in: House/apartment Stairs: No Has following equipment at home: Single point cane  OCCUPATION: semi-retired transportation driver  PLOF: Independent  PATIENT GOALS: stop pain   NEXT MD VISIT:  after PT  OBJECTIVE:  Note: Objective measures were completed at Evaluation unless otherwise noted.  DIAGNOSTIC FINDINGS:  MRI 05/17/23 IMPRESSION: 1. No change in the appearance of the lumbar spine since the prior MRI. 2. Status post L3-5 fusion. No stenosis at the operated levels. 3. Moderately severe central canal stenosis and moderate left foraminal narrowing at L2-3 is unchanged. 4. Mild bilateral foraminal narrowing at L5-S1 is unchanged. 5. Moderate appearing bilateral foraminal narrowing at T10-11.  PATIENT SURVEYS:  Modified Oswestry: 18 / 50 = 36.0 % MODIFIED OSWESTRY DISABILITY SCALE  Date: 12/16/23 Score  Pain intensity 3 =  Pain medication provides me with moderate relief from pain.  2. Personal care (washing, dressing, etc.) 1 =  I can take care of myself normally, but it increases my pain.  3. Lifting 3 = Pain prevents me from lifting heavy weights, but I can manage light to medium weights if they are conveniently positioned  4. Walking 3 =  Pain prevents me from walking more than  mile.  5. Sitting 0 =  I can sit in any chair as long as I like.  6. Standing 4 =  Pain prevents me from standing more than 10 minutes.  7. Sleeping 0 = Pain does not prevent me from sleeping well.  8. Social Life 2 = Pain prevents me from participating in more energetic activities (eg. sports, dancing).  9. Traveling 0 =  I can travel anywhere without increased pain.  10. Employment/ Homemaking 2 = I can perform most of my homemaking/job duties, but pain prevents me from performing more physically stressful activities (eg, lifting, vacuuming).  Total 18/50   Interpretation of scores: Score Category Description  0-20% Minimal Disability The patient can cope with most living activities. Usually no treatment is indicated apart from advice on lifting, sitting and exercise  21-40% Moderate Disability The patient experiences more pain and difficulty with sitting, lifting and standing. Travel and  social life are more difficult and they may be disabled from work. Personal care, sexual activity and sleeping are not grossly affected, and the patient can usually be managed by conservative means  41-60% Severe Disability Pain remains the main problem in this group, but activities of daily living are affected. These patients require a detailed investigation  61-80% Crippled Back pain impinges on all aspects of the patients life. Positive intervention is required  81-100% Bed-bound These patients are either bed-bound or exaggerating their symptoms  Gregory Marshall, et al. Surgery versus conservative management of stable thoracolumbar fracture: the PRESTO feasibility RCT. [place]): [employer]; 2021 Nov. [facility] Technology Assessment, No. 25.62.)  Appendix 3, Oswestry Disability Index category descriptors. Available from: [email or web address].cz  Minimally Clinically Important Difference (MCID) = 12.8%  COGNITION: Overall cognitive status: Within functional limits for tasks assessed     SENSATION: WFL  MUSCLE LENGTH: Tightness in  B quads, hip flexors, piriformis  POSTURE: rounded shoulders, forward head, flexed trunk , and weight shift right  PALPATION: Palpation: unremarkable in B hips and lumbar   LUMBAR ROM:   AROM eval  Flexion Full no pain  Extension Neutral  Right lateral flexion 50% pulls left  Left lateral flexion WFL   Right rotation 75%  Left rotation 80 %   (Blank rows = not tested)  LOWER EXTREMITY ROM:   WFL for tasks assessed  LOWER EXTREMITY MMT:  left lumbar weakness with hip flexion MMT R and with both  MMT Right eval Left eval  Hip flexion 4+ 4+  Hip extension 5 4+  Hip abduction 5 4+  Hip adduction    Hip internal rotation    Hip external rotation    Knee flexion 5 5  Knee extension 5 5  Ankle dorsiflexion 5 5  Ankle plantarflexion    Ankle inversion    Ankle eversion     (Blank rows = not  tested)  LUMBAR SPECIAL TESTS:  FABER test: Positive on Left Hip LLD left  - some relief with FABER + hip scour L  FUNCTIONAL TESTS:  5 times sit to stand: 14.82 sec  GAIT: Distance walked: 20 Assistive device utilized: None Level of assistance: Complete Independence Comments: antalgic decreased stance time L  TREATMENT DATE:                                                                                                                               01/13/24:  NuStep: Level 5x 5 minutes- PT present to discuss progress Seated fig 4 stretch 3x30 sec B painful on L side by 3rd rep Gait with SPC down hall and back - no increase in pain Sit to stand 5# kettlebell  with TA activation + press out: x 10  Seated chops with 5# KB x 10 B Seated trunk ext x 10 with heavy purple power cord Seated crunch with 5# KB x 10 Supine hamstring stretch with strap: 3x20 seconds B 90/90 heel taps x 3 B stops due to L hip pain 90/90 isometric hold 3 x 10 sec  Bridge + clam with yellow loop x 5, Bridge with loop x 10 Sidelying clam: 2x10 bil each  (used yellow loop on R side, no band on L side) Prone knee flexion x 10 B for quad stretch  Prone over pillow-hip extension Rtx10, Lt x2 (pain today) Plank on knees/elbows x 2 reps max hold  some LBP today   01/08/24:  NuStep: Level 5x 5 minutes- PT present to discuss progress Low trunk rotation x10 Rt and Lt Supine hamstring stretch with strap: 3x20 seconds  Prone knee flexion x 10 B  for quad stretch  Prone over pillow-hip extension Rtx10, Lt x5 (pain today) Supine TA activation with hip abduction: yellow loop 2x10 Bridge with yellow loop 2x10 exhale on lift Sidelying clam: x10 bil each  (used yellow loop on R side, too difficult on L) Sit to stand 5# kettlebell: x 5 with TA activation  Sit to stand 5# KB stagerred too painful Plank on knees/elbows x 3 reps max hold Primal push up x 3 reps Standing L leg hang Discussion of use of cane/walking stick  followed by walking with SPC in R hand.  Discussion of use of heat versus ice   01/06/24:  NuStep: Level 5x 5 minutes- PT present to discuss progress Low trunk rotation x10 Rt and Lt Seated figure 4 2x20 seconds  Supine hamstring stretch with strap: 3x20 seconds  Prone over pillow - passive L knee flex stretch 3x20 seconds Prone over pillow - knee on yoga block - L knee flexion x10 for femoral nerve glide/quad stretch  Prone over pillow-hip extension Rtx10, Lt x6 (fatigue) Supine TA activation with hip abduction: yellow loop 2x10 Bridge with yellow loop 2x10 exhale on lift Sidelying clam: x10 bil each  Sit to stand 5# kettlebell: x8 with TA activation    01/01/24:  NuStep: Level 5x 4 minutes- PT present to discuss progress Hip IR/ER x 5 B pain in ant thigh with IR L Prone over pillow - passive L knee flex stretch x 30 Prone over pillow - knee on yoga block - L knee flexion x10 for femoral nerve glide/quad stretch  Supine TA activation with hip abduction: yellow loop 2x10 Bridge with yellow loop 2x10 exhale on lift Seated hip flexor stretch L 3x30 sec Manual: Left hip mobilizations/distraction: Post mob in prone was painful from low back to gluteal; lateral distraction with belt 3x20 sec then pulses - no pain L psoas release in hooklying: 30 sec hold; then with active hip flexion x 10    PATIENT EDUCATION:  Education details: PT eval findings, anticipated POC, initial HEP, and discussion of aquatics option and DN.   Person educated: Patient Education method: Explanation, Demonstration, Tactile cues, Verbal cues, and Handouts Education comprehension: verbalized understanding and returned demonstration  HOME EXERCISE PROGRAM: Access Code: [facility] URL: [email or web address] Date: 01/01/2024 Prepared by: Mliss  Exercises - Quadricep Stretch with Chair and Counter Support  - 2 x daily - 7 x weekly - 1 sets - 3 reps - 30 sec hold - Seated Table Piriformis Stretch   - 2 x daily - 7 x weekly - 1 sets - 3 reps - 30 sec hold - Lying Prone with 1 Pillow  - 3 x daily - 7 x weekly - 1 sets - 1 reps - 1-5 min hold - Seated Hip Flexor Stretch  - 2 x daily - 7 x weekly - 1 sets - 3 reps - 30-60 sec hold  ASSESSMENT:  CLINICAL IMPRESSION: Develle reported less pain today, but still 7-8/10. He did well walking with the [facility] and reported no increase in pain with this. Good tolerance to seated exercises. Heel taps were painful, but 90/90 isometrics were challenging without causing pain. Difficulty with modified planks today with some pain in low back.   OBJECTIVE IMPAIRMENTS: Abnormal gait, decreased activity tolerance, decreased balance, decreased ROM, decreased strength, hypomobility, impaired flexibility, postural dysfunction, and pain.   ACTIVITY LIMITATIONS: carrying, lifting, standing, stairs, transfers, dressing, and locomotion level  PARTICIPATION LIMITATIONS: meal prep, cleaning, laundry, shopping, community activity, and yard work  PERSONAL  FACTORS: Age, Time since onset of injury/illness/exacerbation, and 1 comorbidity: lumbar stenosis are also affecting patient's functional outcome.   REHAB POTENTIAL: Good  CLINICAL DECISION MAKING: Stable/uncomplicated  EVALUATION COMPLEXITY: Low  GOALS: Goals reviewed with patient? Yes  SHORT TERM GOALS: Target date: 01/13/2024   Patient will be independent with initial HEP.  Baseline: compliant with HEP (01/06/24) Goal status: MET  2.  Patient will report decreased radicular symptoms by 25% with standing and walking.  Baseline: no change (01/06/24) Goal status: In progress     LONG TERM GOALS: Target date: 02/10/2024   Patient will be independent with advanced/ongoing HEP to improve outcomes and carryover.  Baseline:  Goal status: INITIAL  2.  Patient will report 50% improvement in low back and radicular pain with with standing and walking to improve QOL.  Baseline:  Goal status: INITIAL  3.   Patient will demonstrate equal WB with sit to stand transfers.   Baseline:  Goal status: INITIAL  4.  Patient will demonstrate improved hip flexibility by being able to sit in a fig 4 position to don shoes/socks. Baseline:  Goal status: INITIAL  5.  Patient will score 12/50 on the Modified Oswestry demonstrating improved functional ability.  Baseline: 18 Goal status: INITIAL      PLAN:  PT FREQUENCY: 2x/week  PT DURATION: 8 weeks  PLANNED INTERVENTIONS: 97110-Therapeutic exercises, 97530- Therapeutic activity, 97112- Neuromuscular re-education, 97535- Self Care, [id number]- Manual therapy, [phone]- Gait training, [phone]- Aquatic Therapy, [phone]- Electrical stimulation (unattended), [phone]- Ionotophoresis 4mg /ml Dexamethasone , 79439 (1-2 muscles), [phone] (3+ muscles)- Dry Needling, Patient/Family education, Balance training, Stair training, Taping, Joint mobilization, Spinal mobilization, DME instructions, Cryotherapy, and Moist heat.  PLAN FOR NEXT SESSION: Continue with gait training with cane, exercise as pt tolerates, continue with planks and similar exercises, B quad/hip flexor and piriformis stretching  Mliss Cummins, PT  01/13/2024 9:31 AM

## 2024-01-15 ENCOUNTER — Ambulatory Visit

## 2024-01-20 ENCOUNTER — Telehealth: Payer: Self-pay

## 2024-01-20 ENCOUNTER — Ambulatory Visit

## 2024-01-20 NOTE — Telephone Encounter (Signed)
 PT called pt due to no-show appt today.  Not able to leave voicemail as his mailbox was full.

## 2024-01-27 ENCOUNTER — Ambulatory Visit

## 2024-01-29 ENCOUNTER — Ambulatory Visit

## 2024-02-03 ENCOUNTER — Ambulatory Visit: Admitting: Physical Therapy

## 2024-02-05 ENCOUNTER — Other Ambulatory Visit (HOSPITAL_COMMUNITY): Payer: Self-pay | Admitting: Internal Medicine

## 2024-02-05 ENCOUNTER — Ambulatory Visit: Admitting: Physical Therapy

## 2024-02-05 DIAGNOSIS — R600 Localized edema: Secondary | ICD-10-CM

## 2024-02-06 ENCOUNTER — Ambulatory Visit (HOSPITAL_COMMUNITY)
Admission: RE | Admit: 2024-02-06 | Discharge: 2024-02-06 | Disposition: A | Discharge status: Home / Self Care | Source: Ambulatory Visit | Attending: Internal Medicine | Admitting: Internal Medicine

## 2024-02-06 DIAGNOSIS — R600 Localized edema: Secondary | ICD-10-CM | POA: Insufficient documentation

## 2024-02-06 DIAGNOSIS — E785 Hyperlipidemia, unspecified: Secondary | ICD-10-CM | POA: Insufficient documentation

## 2024-02-06 DIAGNOSIS — I119 Hypertensive heart disease without heart failure: Secondary | ICD-10-CM | POA: Diagnosis not present

## 2024-02-06 DIAGNOSIS — I358 Other nonrheumatic aortic valve disorders: Secondary | ICD-10-CM | POA: Insufficient documentation

## 2024-02-06 LAB — ECHOCARDIOGRAM COMPLETE
Area-P 1/2: 4.04 cm2
Est EF: >75
S' Lateral: 3.1 cm

## 2024-02-09 ENCOUNTER — Ambulatory Visit: Admission: EM | Admit: 2024-02-09 | Discharge: 2024-02-09 | Disposition: A | Discharge status: Home / Self Care

## 2024-02-09 DIAGNOSIS — N23 Unspecified renal colic: Secondary | ICD-10-CM | POA: Diagnosis not present

## 2024-02-09 DIAGNOSIS — R109 Unspecified abdominal pain: Secondary | ICD-10-CM

## 2024-02-09 LAB — POCT URINE DIPSTICK
Bilirubin, UA: NEGATIVE
Glucose, UA: NEGATIVE mg/dL
Ketones, POC UA: NEGATIVE mg/dL
Leukocytes, UA: NEGATIVE
Nitrite, UA: NEGATIVE
POC PROTEIN,UA: 30 — AB
Spec Grav, UA: >=1.03 — AB
Urobilinogen, UA: 1 U/dL
pH, UA: 5.5

## 2024-02-09 MED ORDER — TAMSULOSIN HCL 0.4 MG PO CAPS: 0.4000 mg | ORAL_CAPSULE | Freq: Every day | ORAL | 0 refills | Status: AC

## 2024-02-09 MED ORDER — KETOROLAC TROMETHAMINE 30 MG/ML IJ SOLN
30.0000 mg | Freq: Once | INTRAMUSCULAR | Status: AC
  Administered 2024-02-09: 30 mg via INTRAMUSCULAR

## 2024-02-09 NOTE — ED Triage Notes (Addendum)
 Pt present right side pain in his abdomen area. Symptoms started today. Pt states have a history of kidney stones

## 2024-02-09 NOTE — Discharge Instructions (Addendum)
 Your symptoms and physical exam are consistent with kidney stone. Will prescribe Flomax  which will widen your ureters ( tubes that connect kidney to bladder) in hopes that stone will pass. You should increase your water intake to half a gallon to a gallon a day to flush out stone. We will give you a strainer to strain your urine, in about 7 days you should see a stream of sand pass through, if you do not or symptoms worsen then we should follow-up with your urologist.

## 2024-02-09 NOTE — ED Provider Notes (Signed)
 " [facility]    CSN: [id number] Arrival date & time: 02/09/24  1449      History   Chief Complaint Chief Complaint  Patient presents with   Abdominal Pain    HPI Gregory Marshall is a 74 y.o. male.   Pt with a hx of kidney stones, presents today due to right sided lower abdominal pain and right sided lower back pain that started this am. Pt states that symptoms feel similar to kidneys stones he has had in the past. Pt denies fever, chills, nausea, or vomiting.  The history is provided by the patient.  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   Neuropathy 01/15/2023   Pain in left hip 12/24/2022   Kidney stone 03/06/2021   Chronic low back pain 12/02/2020   Normocytic anemia 03/07/2019   Lumbar stenosis with neurogenic claudication 10/30/2018   Cataract 06/20/2018   Diverticulosis of colon 06/20/2018   Essential hypertension 04/07/2018   Carcinoma of prostate (HCC) 03/01/2018   Malignant neoplasm of prostate (HCC) 02/28/2018   Fecal occult blood test positive 03/11/2017   Anal fissure 03/11/2017   Constipation 03/11/2017   Hypertensive left ventricular hypertrophy 03/11/2017   Shoulder pain 03/11/2017   Obesity 05/21/2016   Elevated blood-pressure reading, without diagnosis of hypertension 05/21/2016   Prediabetes 05/20/2016   Vitamin D deficiency 05/20/2016   Primary erectile dysfunction 01/18/2016   Palpitations 12/24/2012   Left knee pain 08/05/2012   Sciatica 06/11/2012   Abnormal ECG 02/28/2009   Mixed hyperlipidemia 02/24/2009    Past Surgical History:  Procedure Laterality Date   CARDIAC CATHETERIZATION     CERVICAL FUSION  1998    COLONOSCOPY  2008   LUMBAR LAMINECTOMY/DECOMPRESSION MICRODISCECTOMY Left 06/21/2012   Procedure: LUMBAR LAMINECTOMY/DECOMPRESSION MICRODISCECTOMY 1 LEVEL;  Surgeon: Lamar LELON Peaches, MD;  Location: [facility];  Service: Neurosurgery;  Laterality: Left;  LEFT L3-4 extraforaminal microdiskectomy   MULTIPLE TOOTH EXTRACTIONS     full dentures   RADIOACTIVE SEED IMPLANT N/A 07/07/2018   Procedure: RADIOACTIVE SEED IMPLANT/BRACHYTHERAPY IMPLANT;  Surgeon: Ottelin, Mark, MD;  Location: [facility];  Service: Urology;  Laterality: N/A;   SPACE OAR INSTILLATION N/A 07/07/2018   Procedure: SPACE OAR INSTILLATION;  Surgeon: Ottelin, Mark, MD;  Location: [facility];  Service: Urology;  Laterality: N/A;       Home Medications    Prior to Admission medications  Medication Sig Start Date End Date Taking? Authorizing Provider  aspirin  EC 81 MG tablet Take 1 tablet every day by oral route. 01/09/21  Yes [provider]  bupivacaine  (MARCAINE ) 0.25 % injection Inject 8 mLs into the skin. 09/22/21  Yes [provider]  cephALEXin (KEFLEX) 500 MG capsule Take 1 capsule twice a day by oral route for 7 days. 04/30/23  Yes [provider]  senna-docusate (SENOKOT-S) 8.6-50 MG tablet Take 2 tablets as needed by oral route at bedtime. 09/08/20  Yes [provider]  sildenafil  (VIAGRA ) 100 MG tablet See admin instructions. 08/18/21  Yes [provider]  tamsulosin  (  FLOMAX ) 0.4 MG CAPS capsule Take 1 capsule (0.4 mg total) by mouth daily for 10 days. 02/09/24 02/19/24 Yes Andra Corean BROCKS, PA-C  triamcinolone  acetonide (KENALOG -40) 40 MG/ML injection Inject 80 mg into the articular space. 09/22/21  Yes [provider]  amLODipine  (NORVASC ) 2.5 MG tablet Take 2.5 mg by mouth daily.     [provider]  Baclofen  5 MG TABS Take 1 tablet (5 mg total) by mouth every 8 (eight) hours as needed. 11/28/23   Enedelia Dorna HERO, FNP   docusate sodium (COLACE) 100 MG capsule TAKE 1 CAPSULE DAILY AT BEDTIME    [provider]  gabapentin  (NEURONTIN ) 100 MG capsule Take 100 mg by mouth 3 (three) times daily.    [provider]  HYDROcodone -acetaminophen  (NORCO/VICODIN) 5-325 MG tablet Take 2 tablets by mouth every 4 (four) hours as needed. 08/01/23   Dreama, [place]  N, FNP  ibuprofen (ADVIL) 600 MG tablet TAKE 1 TABLET TWICE A DAY BY ORAL ROUTE AS NEEDED.    [provider]  meloxicam  (MOBIC ) 7.5 MG tablet Take 1 tablet (7.5 mg total) by mouth daily. 05/01/21   Banister, Pamela K, MD  methocarbamol (ROBAXIN) 500 MG tablet Take 500 mg by mouth at bedtime as needed.    [provider]  ondansetron  (ZOFRAN -ODT) 4 MG disintegrating tablet Take 1 tablet (4 mg total) by mouth every 8 (eight) hours as needed for nausea or vomiting. 08/01/23   Dreama, [place]  N, FNP  rosuvastatin  (CRESTOR ) 10 MG tablet Take 10 mg by mouth at bedtime.    [provider]  tamsulosin  (FLOMAX ) 0.4 MG CAPS capsule tamsulosin  0.4 mg capsule  Take 1 capsule every day by oral route in the evening.    [provider]  traMADol (ULTRAM) 50 MG tablet Take by mouth every 6 (six) hours as needed.    [provider]    Family History Family History  Problem Relation Age of Onset   Alzheimer's disease Mother    Heart disease Brother 14       CABG   Prostate cancer Maternal Uncle    Prostate cancer Cousin        paternal   Prostate cancer Cousin        paternal   Colon cancer Neg Hx    Colon polyps Neg Hx    Rectal cancer Neg Hx    Stomach cancer Neg Hx     Social History Social History[1]   Allergies   Patient has no known allergies.   Review of Systems Review of Systems  Gastrointestinal:  Positive for abdominal pain.     Physical Exam Triage Vital Signs ED Triage Vitals  Encounter Vitals Group     BP 02/09/24 1459 (!) 153/88     Girls Systolic BP Percentile --      Girls  Diastolic BP Percentile --      Boys Systolic BP Percentile --      Boys Diastolic BP Percentile --      Pulse Rate 02/09/24 1459 69     Resp 02/09/24 1459 18     Temp 02/09/24 1459 97.8 F (36.6 C)     Temp Source 02/09/24 1459 Oral     SpO2 02/09/24 1459 97 %     Weight --      Height --      Head Circumference --      Peak Flow --      Pain Score 02/09/24 1458 10  Pain Loc --      Pain Education --      Exclude from Growth Chart --    No data found.  Updated Vital Signs BP (!) 153/88 (BP Location: Left Arm)   Pulse 69   Temp 97.8 F (36.6 C) (Oral)   Resp 18   SpO2 97%   Visual Acuity Right Eye Distance:   Left Eye Distance:   Bilateral Distance:    Right Eye Near:   Left Eye Near:    Bilateral Near:     Physical Exam Vitals and nursing note reviewed.  Constitutional:      General: He is not in acute distress.    Appearance: Normal appearance. He is not ill-appearing, toxic-appearing or diaphoretic.  Eyes:     General: No scleral icterus. Cardiovascular:     Rate and Rhythm: Normal rate and regular rhythm.     Heart sounds: Normal heart sounds.  Pulmonary:     Effort: Pulmonary effort is normal. No respiratory distress.     Breath sounds: Normal breath sounds. No wheezing or rhonchi.  Abdominal:     General: Abdomen is flat. Bowel sounds are normal.     Palpations: Abdomen is soft.     Tenderness: There is abdominal tenderness in the right lower quadrant. There is right CVA tenderness. There is no left CVA tenderness.  Skin:    General: Skin is warm.  Neurological:     Mental Status: He is alert and oriented to person, place, and time.  Psychiatric:        Mood and Affect: Mood normal.        Behavior: Behavior normal.      UC Treatments / Results  Labs (all labs ordered are listed, but only abnormal results are displayed) Labs Reviewed  POCT URINE DIPSTICK - Abnormal; Notable for the following components:      Result Value   Clarity, UA  hazy (*)    Spec Grav, UA >=1.030 (*)    Blood, UA trace-intact (*)    POC PROTEIN,UA =30 (*)    All other components within normal limits    EKG   Radiology No results found.  Procedures Procedures (including critical care time)  Medications Ordered in UC Medications  ketorolac  (TORADOL ) 30 MG/ML injection 30 mg (30 mg Intramuscular Given 02/09/24 1513)    Initial Impression / Assessment and Plan / UC Course  I have reviewed the triage vital signs and the nursing notes.  Pertinent labs & imaging results that were available during my care of the patient were reviewed by me and considered in my medical decision making (see chart for details).     Final Clinical Impressions(s) / UC Diagnoses   Final diagnoses:  Abdominal pain, unspecified abdominal location  Renal colic on right side     Discharge Instructions      Your symptoms and physical exam are consistent with kidney stone. Will prescribe Flomax  which will widen your ureters ( tubes that connect kidney to bladder) in hopes that stone will pass. You should increase your water intake to half a gallon to a gallon a day to flush out stone. We will give you a strainer to strain your urine, in about 7 days you should see a stream of sand pass through, if you do not or symptoms worsen then we should follow-up with your urologist.     ED Prescriptions     Medication Sig Dispense Auth. Provider   tamsulosin  (FLOMAX ) 0.4  MG CAPS capsule Take 1 capsule (0.4 mg total) by mouth daily for 10 days. 10 capsule Andra Corean BROCKS, PA-C      PDMP not reviewed this encounter.    [1]  Social History Tobacco Use   Smoking status: Never   Smokeless tobacco: Never  Vaping Use   Vaping status: Never Used  Substance Use Topics   Alcohol use: Not Currently    Alcohol/week: 0.0 standard drinks of alcohol   Drug use: Never     Andra Corean BROCKS, PA-C 02/09/24 1520  "

## 2024-02-10 ENCOUNTER — Ambulatory Visit: Admitting: Physical Therapy

## 2024-02-13 ENCOUNTER — Telehealth: Payer: Self-pay

## 2024-02-13 NOTE — Telephone Encounter (Signed)
 Pt's PCP calling to request that pt recent Echo results be faxed to their office. Please advise

## 2024-02-17 NOTE — Telephone Encounter (Signed)
 Patient PCP office is calling to request EKG results    Fax # [phone]

## 2024-02-17 NOTE — Telephone Encounter (Signed)
*  ECHO Results

## 2024-02-26 ENCOUNTER — Other Ambulatory Visit: Payer: Self-pay | Admitting: Neurosurgery

## 2024-03-26 ENCOUNTER — Ambulatory Visit (HOSPITAL_COMMUNITY): Admit: 2024-03-26
